# Patient Record
Sex: Female | Born: 1945 | Race: White | Hispanic: No | Marital: Married | Smoking: Former smoker
Health system: Southern US, Community
[De-identification: ages and names within clinical notes are randomized; demographics above are authoritative.]

## PROBLEM LIST (undated history)

## (undated) DIAGNOSIS — K759 Inflammatory liver disease, unspecified: Secondary | ICD-10-CM

## (undated) DIAGNOSIS — E7800 Pure hypercholesterolemia, unspecified: Secondary | ICD-10-CM

## (undated) DIAGNOSIS — N183 Chronic kidney disease, stage 3 unspecified: Secondary | ICD-10-CM

## (undated) DIAGNOSIS — Z972 Presence of dental prosthetic device (complete) (partial): Secondary | ICD-10-CM

## (undated) DIAGNOSIS — I2510 Atherosclerotic heart disease of native coronary artery without angina pectoris: Secondary | ICD-10-CM

## (undated) DIAGNOSIS — I351 Nonrheumatic aortic (valve) insufficiency: Secondary | ICD-10-CM

## (undated) DIAGNOSIS — E559 Vitamin D deficiency, unspecified: Secondary | ICD-10-CM

## (undated) DIAGNOSIS — M19072 Primary osteoarthritis, left ankle and foot: Secondary | ICD-10-CM

## (undated) DIAGNOSIS — N189 Chronic kidney disease, unspecified: Secondary | ICD-10-CM

## (undated) DIAGNOSIS — E119 Type 2 diabetes mellitus without complications: Secondary | ICD-10-CM

## (undated) DIAGNOSIS — R0609 Other forms of dyspnea: Secondary | ICD-10-CM

## (undated) DIAGNOSIS — H409 Unspecified glaucoma: Secondary | ICD-10-CM

## (undated) DIAGNOSIS — Z87442 Personal history of urinary calculi: Secondary | ICD-10-CM

## (undated) DIAGNOSIS — I6523 Occlusion and stenosis of bilateral carotid arteries: Secondary | ICD-10-CM

## (undated) DIAGNOSIS — G629 Polyneuropathy, unspecified: Secondary | ICD-10-CM

## (undated) DIAGNOSIS — R0600 Dyspnea, unspecified: Secondary | ICD-10-CM

## (undated) DIAGNOSIS — I10 Essential (primary) hypertension: Secondary | ICD-10-CM

## (undated) HISTORY — PX: CARDIAC CATHETERIZATION: SHX172

## (undated) HISTORY — PX: CORONARY ANGIOPLASTY: SHX604

## (undated) HISTORY — PX: COLONOSCOPY: SHX174

## (undated) HISTORY — PX: ABDOMINAL HYSTERECTOMY: SHX81

## (undated) HISTORY — PX: TONSILLECTOMY: SUR1361

## (undated) HISTORY — PX: BREAST CYST ASPIRATION: SHX578

## (undated) HISTORY — PX: EYE SURGERY: SHX253

## (undated) HISTORY — PX: OTHER SURGICAL HISTORY: SHX169

---

## 1973-09-12 HISTORY — PX: ABDOMINAL HYSTERECTOMY: SHX81

## 1991-09-13 DIAGNOSIS — K759 Inflammatory liver disease, unspecified: Secondary | ICD-10-CM

## 1991-09-13 DIAGNOSIS — B1910 Unspecified viral hepatitis B without hepatic coma: Secondary | ICD-10-CM

## 1991-09-13 HISTORY — DX: Inflammatory liver disease, unspecified: K75.9

## 1991-09-13 HISTORY — DX: Unspecified viral hepatitis B without hepatic coma: B19.10

## 1997-09-12 HISTORY — PX: BREAST CYST ASPIRATION: SHX578

## 2004-08-12 ENCOUNTER — Ambulatory Visit: Payer: Self-pay | Admitting: Internal Medicine

## 2005-10-27 ENCOUNTER — Ambulatory Visit: Payer: Self-pay | Admitting: Internal Medicine

## 2006-10-30 ENCOUNTER — Ambulatory Visit: Payer: Self-pay | Admitting: Internal Medicine

## 2006-11-02 ENCOUNTER — Ambulatory Visit: Payer: Self-pay | Admitting: Internal Medicine

## 2007-05-04 ENCOUNTER — Ambulatory Visit: Payer: Self-pay | Admitting: Internal Medicine

## 2007-11-15 ENCOUNTER — Ambulatory Visit: Payer: Self-pay | Admitting: Internal Medicine

## 2008-11-27 ENCOUNTER — Ambulatory Visit: Payer: Self-pay | Admitting: Internal Medicine

## 2009-12-28 ENCOUNTER — Ambulatory Visit: Payer: Self-pay | Admitting: Internal Medicine

## 2009-12-30 ENCOUNTER — Ambulatory Visit: Payer: Self-pay | Admitting: Internal Medicine

## 2010-12-30 ENCOUNTER — Ambulatory Visit: Payer: Self-pay | Admitting: Internal Medicine

## 2012-01-03 ENCOUNTER — Ambulatory Visit: Payer: Self-pay | Admitting: Internal Medicine

## 2013-01-03 ENCOUNTER — Ambulatory Visit: Payer: Self-pay | Admitting: Internal Medicine

## 2013-01-15 ENCOUNTER — Ambulatory Visit: Payer: Self-pay | Admitting: Internal Medicine

## 2014-01-16 ENCOUNTER — Ambulatory Visit: Payer: Self-pay | Admitting: Internal Medicine

## 2014-07-04 ENCOUNTER — Ambulatory Visit: Payer: Self-pay | Admitting: Gastroenterology

## 2014-07-04 DIAGNOSIS — D126 Benign neoplasm of colon, unspecified: Secondary | ICD-10-CM

## 2014-07-04 HISTORY — DX: Benign neoplasm of colon, unspecified: D12.6

## 2014-12-26 ENCOUNTER — Other Ambulatory Visit: Payer: Self-pay | Admitting: Internal Medicine

## 2014-12-26 DIAGNOSIS — Z1231 Encounter for screening mammogram for malignant neoplasm of breast: Secondary | ICD-10-CM

## 2015-01-05 LAB — SURGICAL PATHOLOGY

## 2015-01-20 ENCOUNTER — Ambulatory Visit
Admission: RE | Admit: 2015-01-20 | Discharge: 2015-01-20 | Disposition: A | Discharge status: Home / Self Care | Payer: BLUE CROSS/BLUE SHIELD | Source: Ambulatory Visit | Attending: Internal Medicine | Admitting: Internal Medicine

## 2015-01-20 DIAGNOSIS — Z1231 Encounter for screening mammogram for malignant neoplasm of breast: Secondary | ICD-10-CM | POA: Insufficient documentation

## 2015-12-24 ENCOUNTER — Other Ambulatory Visit: Payer: Self-pay | Admitting: Internal Medicine

## 2015-12-24 DIAGNOSIS — Z1231 Encounter for screening mammogram for malignant neoplasm of breast: Secondary | ICD-10-CM

## 2016-01-21 ENCOUNTER — Ambulatory Visit
Admission: RE | Admit: 2016-01-21 | Discharge: 2016-01-21 | Disposition: A | Discharge status: Home / Self Care | Payer: BLUE CROSS/BLUE SHIELD | Source: Ambulatory Visit | Attending: Internal Medicine | Admitting: Internal Medicine

## 2016-01-21 DIAGNOSIS — Z1231 Encounter for screening mammogram for malignant neoplasm of breast: Secondary | ICD-10-CM | POA: Diagnosis present

## 2016-09-23 ENCOUNTER — Encounter: Payer: Self-pay | Admitting: *Deleted

## 2016-09-27 NOTE — Discharge Instructions (Signed)
Cataract Surgery, Care After °Refer to this sheet in the next few weeks. These instructions provide you with information about caring for yourself after your procedure. Your health care provider may also give you more specific instructions. Your treatment has been planned according to current medical practices, but problems sometimes occur. Call your health care provider if you have any problems or questions after your procedure. °What can I expect after the procedure? °After the procedure, it is common to have: °· Itching. °· Discomfort. °· Fluid discharge. °· Sensitivity to light and to touch. °· Bruising. °Follow these instructions at home: °Eye Care  °· Check your eye every day for signs of infection. Watch for: °¨ Redness, swelling, or pain. °¨ Fluid, blood, or pus. °¨ Warmth. °¨ Bad smell. °Activity  °· Avoid strenuous activities, such as playing contact sports, for as long as told by your health care provider. °· Do not drive or operate heavy machinery until your health care provider approves. °· Do not bend or lift heavy objects . Bending increases pressure in the eye. You can walk, climb stairs, and do light household chores. °· Ask your health care provider when you can return to work. If you work in a dusty environment, you may be advised to wear protective eyewear for a period of time. °General instructions  °· Take or apply over-the-counter and prescription medicines only as told by your health care provider. This includes eye drops. °· Do not touch or rub your eyes. °· If you were given a protective shield, wear it as told by your health care provider. If you were not given a protective shield, wear sunglasses as told by your health care provider to protect your eyes. °· Keep the area around your eye clean and dry. Avoid swimming or allowing water to hit you directly in the face while showering until told by your health care provider. Keep soap and shampoo out of your eyes. °· Do not put a contact lens  into the affected eye or eyes until your health care provider approves. °· Keep all follow-up visits as told by your health care provider. This is important. °Contact a health care provider if: ° °· You have increased bruising around your eye. °· You have pain that is not helped with medicine. °· You have a fever. °· You have redness, swelling, or pain in your eye. °· You have fluid, blood, or pus coming from your incision. °· Your vision gets worse. °Get help right away if: °· You have sudden vision loss. °This information is not intended to replace advice given to you by your health care provider. Make sure you discuss any questions you have with your health care provider. °Document Released: 03/18/2005 Document Revised: 01/07/2016 Document Reviewed: 07/09/2015 °Elsevier Interactive Patient Education © 2017 [employer] ° ° ° ° °General Anesthesia, Adult, Care After °These instructions provide you with information about caring for yourself after your procedure. Your health care provider may also give you more specific instructions. Your treatment has been planned according to current medical practices, but problems sometimes occur. Call your health care provider if you have any problems or questions after your procedure. °What can I expect after the procedure? °After the procedure, it is common to have: °· Vomiting. °· A sore throat. °· Mental slowness. °It is common to feel: °· Nauseous. °· Cold or shivery. °· Sleepy. °· Tired. °· Sore or achy, even in parts of your body where you did not have surgery. °Follow these instructions at   home: °For at least 24 hours after the procedure:  °· Do not: °¨ Participate in activities where you could fall or become injured. °¨ Drive. °¨ Use heavy machinery. °¨ Drink alcohol. °¨ Take sleeping pills or medicines that cause drowsiness. °¨ Make important decisions or sign legal documents. °¨ Take care of children on your own. °· Rest. °Eating and drinking  °· If you vomit, drink  water, juice, or soup when you can drink without vomiting. °· Drink enough fluid to keep your urine clear or pale yellow. °· Make sure you have little or no nausea before eating solid foods. °· Follow the diet recommended by your health care provider. °General instructions  °· Have a responsible adult stay with you until you are awake and alert. °· Return to your normal activities as told by your health care provider. Ask your health care provider what activities are safe for you. °· Take over-the-counter and prescription medicines only as told by your health care provider. °· If you smoke, do not smoke without supervision. °· Keep all follow-up visits as told by your health care provider. This is important. °Contact a health care provider if: °· You continue to have nausea or vomiting at home, and medicines are not helpful. °· You cannot drink fluids or start eating again. °· You cannot urinate after 8-12 hours. °· You develop a skin rash. °· You have fever. °· You have increasing redness at the site of your procedure. °Get help right away if: °· You have difficulty breathing. °· You have chest pain. °· You have unexpected bleeding. °· You feel that you are having a life-threatening or urgent problem. °This information is not intended to replace advice given to you by your health care provider. Make sure you discuss any questions you have with your health care provider. °Document Released: 12/05/2000 Document Revised: 02/01/2016 Document Reviewed: 08/13/2015 °Elsevier Interactive Patient Education © 2017 [employer] ° °

## 2016-10-19 ENCOUNTER — Encounter
Admission: RE | Disposition: A | Discharge status: Home / Self Care | Payer: Self-pay | Source: Ambulatory Visit | Attending: Ophthalmology

## 2016-10-19 ENCOUNTER — Encounter: Payer: Self-pay | Admitting: Anesthesiology

## 2016-10-19 ENCOUNTER — Ambulatory Visit
Admission: RE | Admit: 2016-10-19 | Discharge: 2016-10-19 | Disposition: A | Discharge status: Home / Self Care | Payer: BLUE CROSS/BLUE SHIELD | Source: Ambulatory Visit | Attending: Ophthalmology | Admitting: Ophthalmology

## 2016-10-19 DIAGNOSIS — H2511 Age-related nuclear cataract, right eye: Secondary | ICD-10-CM | POA: Insufficient documentation

## 2016-10-19 HISTORY — DX: Presence of dental prosthetic device (complete) (partial): Z97.2

## 2016-10-19 HISTORY — DX: Inflammatory liver disease, unspecified: K75.9

## 2016-10-19 HISTORY — DX: Pure hypercholesterolemia, unspecified: E78.00

## 2016-10-19 HISTORY — DX: Essential (primary) hypertension: I10

## 2016-10-19 HISTORY — PX: CATARACT EXTRACTION W/PHACO: SHX586

## 2016-10-19 HISTORY — DX: Polyneuropathy, unspecified: G62.9

## 2016-10-19 SURGERY — PHACOEMULSIFICATION, CATARACT, WITH IOL INSERTION
Anesthesia: Monitor Anesthesia Care | Laterality: Right | Wound class: Clean

## 2016-10-19 MED ORDER — ACETAMINOPHEN 160 MG/5ML PO SOLN: 325.0000 mg | ORAL | Status: DC | PRN

## 2016-10-19 MED ORDER — EPINEPHRINE PF 1 MG/ML IJ SOLN
INTRAMUSCULAR | Status: DC | PRN
  Administered 2016-10-19: 59 mL via OPHTHALMIC

## 2016-10-19 MED ORDER — ACETAMINOPHEN 325 MG PO TABS: 325.0000 mg | ORAL_TABLET | ORAL | Status: DC | PRN

## 2016-10-19 MED ORDER — LIDOCAINE HCL (PF) 2 % IJ SOLN
INTRAMUSCULAR | Status: DC | PRN
  Administered 2016-10-19: 1 mL

## 2016-10-19 MED ORDER — ARMC OPHTHALMIC DILATING DROPS
1.0000 "application " | OPHTHALMIC | Status: DC | PRN
  Administered 2016-10-19 (×2): 1 via OPHTHALMIC

## 2016-10-19 MED ORDER — FENTANYL CITRATE (PF) 100 MCG/2ML IJ SOLN
INTRAMUSCULAR | Status: DC | PRN
  Administered 2016-10-19: 100 ug via INTRAVENOUS

## 2016-10-19 MED ORDER — NA HYALUR & NA CHOND-NA HYALUR 0.4-0.35 ML IO KIT
PACK | INTRAOCULAR | Status: DC | PRN
  Administered 2016-10-19: 1 mL via INTRAOCULAR

## 2016-10-19 MED ORDER — LACTATED RINGERS IV SOLN: INTRAVENOUS | Status: DC

## 2016-10-19 MED ORDER — MOXIFLOXACIN HCL 0.5 % OP SOLN
1.0000 [drp] | OPHTHALMIC | Status: DC | PRN
  Administered 2016-10-19 (×3): 1 [drp] via OPHTHALMIC

## 2016-10-19 MED ORDER — MIDAZOLAM HCL 2 MG/2ML IJ SOLN
INTRAMUSCULAR | Status: DC | PRN
  Administered 2016-10-19: 2 mg via INTRAVENOUS

## 2016-10-19 MED ORDER — BRIMONIDINE TARTRATE-TIMOLOL 0.2-0.5 % OP SOLN
OPHTHALMIC | Status: DC | PRN
  Administered 2016-10-19: 1 [drp] via OPHTHALMIC

## 2016-10-19 MED ORDER — CEFUROXIME OPHTHALMIC INJECTION 1 MG/0.1 ML
INJECTION | OPHTHALMIC | Status: DC | PRN
  Administered 2016-10-19: 0.1 mL via OPHTHALMIC

## 2016-10-19 SURGICAL SUPPLY — 25 items
CANNULA ANT/CHMB 27GA (MISCELLANEOUS) ×3 IMPLANT
CARTRIDGE ABBOTT (MISCELLANEOUS) IMPLANT
GLOVE SURG LX 7.5 STRW (GLOVE) ×2
GLOVE SURG LX STRL 7.5 STRW (GLOVE) ×1 IMPLANT
GLOVE SURG TRIUMPH 8.0 PF LTX (GLOVE) ×3 IMPLANT
GOWN STRL REUS W/ TWL LRG LVL3 (GOWN DISPOSABLE) ×2 IMPLANT
GOWN STRL REUS W/TWL LRG LVL3 (GOWN DISPOSABLE) ×4
LENS IOL TECNIS ITEC 18.5 (Intraocular Lens) ×3 IMPLANT
MARKER SKIN DUAL TIP RULER LAB (MISCELLANEOUS) ×3 IMPLANT
NDL RETROBULBAR .5 NSTRL (NEEDLE) IMPLANT
NEEDLE FILTER BLUNT 18X 1/2SAF (NEEDLE) ×2
NEEDLE FILTER BLUNT 18X1 1/2 (NEEDLE) ×1 IMPLANT
PACK CATARACT BRASINGTON (MISCELLANEOUS) ×3 IMPLANT
PACK EYE AFTER SURG (MISCELLANEOUS) ×3 IMPLANT
PACK OPTHALMIC (MISCELLANEOUS) ×3 IMPLANT
RING MALYGIN 7.0 (MISCELLANEOUS) IMPLANT
SUT ETHILON 10-0 CS-B-6CS-B-6 (SUTURE)
SUT VICRYL  9 0 (SUTURE)
SUT VICRYL 9 0 (SUTURE) IMPLANT
SUTURE EHLN 10-0 CS-B-6CS-B-6 (SUTURE) IMPLANT
SYR 3ML LL SCALE MARK (SYRINGE) ×3 IMPLANT
SYR 5ML LL (SYRINGE) ×3 IMPLANT
SYR TB 1ML LUER SLIP (SYRINGE) ×3 IMPLANT
WATER STERILE IRR 250ML POUR (IV SOLUTION) ×3 IMPLANT
WIPE NON LINTING 3.25X3.25 (MISCELLANEOUS) ×3 IMPLANT

## 2016-10-19 NOTE — Op Note (Signed)
LOCATION:  [place]   PREOPERATIVE DIAGNOSIS:    Nuclear sclerotic cataract right eye. H25.11   POSTOPERATIVE DIAGNOSIS:  Nuclear sclerotic cataract right eye.     PROCEDURE:  Phacoemusification with posterior chamber intraocular lens placement of the right eye   LENS:   Implant Name Type Inv. Item Serial No. Manufacturer Lot No. LRB No. Used  LENS IOL DIOP 18.5 - [id number] Intraocular Lens LENS IOL DIOP 18.5 [id number]   Right 1        ULTRASOUND TIME: 17 % of 1 minutes, 3 seconds.  CDE 10.6   SURGEON:  Wyonia Hough, MD   ANESTHESIA:  Topical with tetracaine drops and 2% Xylocaine jelly, augmented with 1% preservative-free intracameral lidocaine.    COMPLICATIONS:  None.   DESCRIPTION OF PROCEDURE:  The patient was identified in the holding room and transported to the operating room and placed in the supine position under the operating microscope.  The right eye was identified as the operative eye and it was prepped and draped in the usual sterile ophthalmic fashion.   A 1 millimeter clear-corneal paracentesis was made at the 12:00 position.  0.5 ml of preservative-free 1% lidocaine was injected into the anterior chamber. The anterior chamber was filled with Viscoat viscoelastic.  A 2.4 millimeter keratome was used to make a near-clear corneal incision at the 9:00 position.  A curvilinear capsulorrhexis was made with a cystotome and capsulorrhexis forceps.  Balanced salt solution was used to hydrodissect and hydrodelineate the nucleus.   Phacoemulsification was then used in stop and chop fashion to remove the lens nucleus and epinucleus.  The remaining cortex was then removed using the irrigation and aspiration handpiece. Provisc was then placed into the capsular bag to distend it for lens placement.  A lens was then injected into the capsular bag.  The remaining viscoelastic was aspirated.   Wounds were hydrated with balanced salt solution.  The anterior  chamber was inflated to a physiologic pressure with balanced salt solution.  No wound leaks were noted. Cefuroxime 0.1 ml of a 10mg /ml solution was injected into the anterior chamber for a dose of 1 mg of intracameral antibiotic at the completion of the case.   Timolol and Brimonidine drops were applied to the eye.  The patient was taken to the recovery room in stable condition without complications of anesthesia or surgery.   Zonnie Landen 10/19/2016, 11:35 AM

## 2016-10-19 NOTE — Anesthesia Procedure Notes (Signed)
Procedure Name: MAC Performed by: Mayme Genta Pre-anesthesia Checklist: Patient identified, Emergency Drugs available, Suction available, Timeout performed and Patient being monitored Patient Re-evaluated:Patient Re-evaluated prior to inductionOxygen Delivery Method: Nasal cannula Placement Confirmation: positive ETCO2

## 2016-10-19 NOTE — Transfer of Care (Signed)
Immediate Anesthesia Transfer of Care Note  Patient: Tamara Villegas  Procedure(s) Performed: Procedure(s): CATARACT EXTRACTION PHACO AND INTRAOCULAR LENS PLACEMENT (I[facility]) Right (Right)  Patient Location: PACU  Anesthesia Type: MAC  Level of Consciousness: awake, alert  and patient cooperative  Airway and Oxygen Therapy: Patient Spontanous Breathing and Patient connected to supplemental oxygen  Post-op Assessment: Post-op Vital signs reviewed, Patient's Cardiovascular Status Stable, Respiratory Function Stable, Patent Airway and No signs of Nausea or vomiting  Post-op Vital Signs: Reviewed and stable  Complications: No apparent anesthesia complications

## 2016-10-19 NOTE — Anesthesia Preprocedure Evaluation (Signed)
Anesthesia Evaluation  Patient identified by MRN, date of birth, ID band Patient awake    Reviewed: Allergy & Precautions, H&P , NPO status , Patient's Chart, lab work & pertinent test results  Airway Mallampati: II  TM Distance: >3 FB Neck ROM: full    Dental no notable dental hx.    Pulmonary former smoker,    Pulmonary exam normal        Cardiovascular hypertension, Normal cardiovascular exam     Neuro/Psych    GI/Hepatic   Endo/Other    Renal/GU      Musculoskeletal   Abdominal   Peds  Hematology   Anesthesia Other Findings   Reproductive/Obstetrics                             Anesthesia Physical Anesthesia Plan  ASA: II  Anesthesia Plan: MAC   Post-op Pain Management:    Induction:   Airway Management Planned:   Additional Equipment:   Intra-op Plan:   Post-operative Plan:   Informed Consent: I have reviewed the patients History and Physical, chart, labs and discussed the procedure including the risks, benefits and alternatives for the proposed anesthesia with the patient or authorized representative who has indicated his/her understanding and acceptance.     Plan Discussed with:   Anesthesia Plan Comments:         Anesthesia Quick Evaluation

## 2016-10-19 NOTE — H&P (Signed)
The History and Physical notes are on paper, have been signed, and are to be scanned. The patient remains stable and unchanged from the H&P.   Previous H&P reviewed, patient examined, and there are no changes.  Tamara Villegas 10/19/2016 10:36 AM

## 2016-10-19 NOTE — Anesthesia Postprocedure Evaluation (Signed)
Anesthesia Post Note  Patient: [place]  Procedure(s) Performed: Procedure(s) (LRB): CATARACT EXTRACTION PHACO AND INTRAOCULAR LENS PLACEMENT ([facility]) Right (Right)  Patient location during evaluation: PACU Anesthesia Type: MAC Level of consciousness: awake and alert and oriented Pain management: satisfactory to patient Vital Signs Assessment: post-procedure vital signs reviewed and stable Respiratory status: spontaneous breathing, nonlabored ventilation and respiratory function stable Cardiovascular status: blood pressure returned to baseline and stable Postop Assessment: Adequate PO intake and No signs of nausea or vomiting Anesthetic complications: no    Raliegh Ip

## 2016-10-20 ENCOUNTER — Encounter: Payer: Self-pay | Admitting: Ophthalmology

## 2016-10-26 ENCOUNTER — Encounter: Payer: Self-pay | Admitting: *Deleted

## 2016-10-26 NOTE — Discharge Instructions (Signed)
General Anesthesia, Adult, Care After °These instructions provide you with information about caring for yourself after your procedure. Your health care provider may also give you more specific instructions. Your treatment has been planned according to current medical practices, but problems sometimes occur. Call your health care provider if you have any problems or questions after your procedure. °What can I expect after the procedure? °After the procedure, it is common to have: °· Vomiting. °· A sore throat. °· Mental slowness. °It is common to feel: °· Nauseous. °· Cold or shivery. °· Sleepy. °· Tired. °· Sore or achy, even in parts of your body where you did not have surgery. °Follow these instructions at home: °For at least 24 hours after the procedure: °· Do not: °¨ Participate in activities where you could fall or become injured. °¨ Drive. °¨ Use heavy machinery. °¨ Drink alcohol. °¨ Take sleeping pills or medicines that cause drowsiness. °¨ Make important decisions or sign legal documents. °¨ Take care of children on your own. °· Rest. °Eating and drinking °· If you vomit, drink water, juice, or soup when you can drink without vomiting. °· Drink enough fluid to keep your urine clear or pale yellow. °· Make sure you have little or no nausea before eating solid foods. °· Follow the diet recommended by your health care provider. °General instructions °· Have a responsible adult stay with you until you are awake and alert. °· Return to your normal activities as told by your health care provider. Ask your health care provider what activities are safe for you. °· Take over-the-counter and prescription medicines only as told by your health care provider. °· If you smoke, do not smoke without supervision. °· Keep all follow-up visits as told by your health care provider. This is important. °Contact a health care provider if: °· You continue to have nausea or vomiting at home, and medicines are not helpful. °· You  cannot drink fluids or start eating again. °· You cannot urinate after 8-12 hours. °· You develop a skin rash. °· You have fever. °· You have increasing redness at the site of your procedure. °Get help right away if: °· You have difficulty breathing. °· You have chest pain. °· You have unexpected bleeding. °· You feel that you are having a life-threatening or urgent problem. °This information is not intended to replace advice given to you by your health care provider. Make sure you discuss any questions you have with your health care provider. °Document Released: 12/05/2000 Document Revised: 02/01/2016 Document Reviewed: 08/13/2015 °Elsevier Interactive Patient Education © 2017 [employer] ° ° °Cataract Surgery, Care After °Refer to this sheet in the next few weeks. These instructions provide you with information about caring for yourself after your procedure. Your health care provider may also give you more specific instructions. Your treatment has been planned according to current medical practices, but problems sometimes occur. Call your health care provider if you have any problems or questions after your procedure. °What can I expect after the procedure? °After the procedure, it is common to have: °· Itching. °· Discomfort. °· Fluid discharge. °· Sensitivity to light and to touch. °· Bruising. °Follow these instructions at home: °Eye Care °· Check your eye every day for signs of infection. Watch for: °¨ Redness, swelling, or pain. °¨ Fluid, blood, or pus. °¨ Warmth. °¨ Bad smell. °Activity °· Avoid strenuous activities, such as playing contact sports, for as long as told by your health care provider. °· Do not   drive or operate heavy machinery until your health care provider approves. °· Do not bend or lift heavy objects . Bending increases pressure in the eye. You can walk, climb stairs, and do light household chores. °· Ask your health care provider when you can return to work. If you work in a dusty  environment, you may be advised to wear protective eyewear for a period of time. °General instructions °· Take or apply over-the-counter and prescription medicines only as told by your health care provider. This includes eye drops. °· Do not touch or rub your eyes. °· If you were given a protective shield, wear it as told by your health care provider. If you were not given a protective shield, wear sunglasses as told by your health care provider to protect your eyes. °· Keep the area around your eye clean and dry. Avoid swimming or allowing water to hit you directly in the face while showering until told by your health care provider. Keep soap and shampoo out of your eyes. °· Do not put a contact lens into the affected eye or eyes until your health care provider approves. °· Keep all follow-up visits as told by your health care provider. This is important. °Contact a health care provider if: ° °· You have increased bruising around your eye. °· You have pain that is not helped with medicine. °· You have a fever. °· You have redness, swelling, or pain in your eye. °· You have fluid, blood, or pus coming from your incision. °· Your vision gets worse. °Get help right away if: °· You have sudden vision loss. °This information is not intended to replace advice given to you by your health care provider. Make sure you discuss any questions you have with your health care provider. °Document Released: 03/18/2005 Document Revised: 01/07/2016 Document Reviewed: 07/09/2015 °Elsevier Interactive Patient Education © 2017 [employer] Inc. ° °

## 2016-11-02 ENCOUNTER — Ambulatory Visit: Payer: BLUE CROSS/BLUE SHIELD | Admitting: Student in an Organized Health Care Education/Training Program

## 2016-11-02 ENCOUNTER — Ambulatory Visit
Admission: RE | Admit: 2016-11-02 | Discharge: 2016-11-02 | Disposition: A | Discharge status: Home / Self Care | Payer: BLUE CROSS/BLUE SHIELD | Source: Ambulatory Visit | Attending: Ophthalmology | Admitting: Ophthalmology

## 2016-11-02 ENCOUNTER — Encounter
Admission: RE | Disposition: A | Discharge status: Home / Self Care | Payer: Self-pay | Source: Ambulatory Visit | Attending: Ophthalmology

## 2016-11-02 DIAGNOSIS — I10 Essential (primary) hypertension: Secondary | ICD-10-CM | POA: Diagnosis not present

## 2016-11-02 DIAGNOSIS — Z87891 Personal history of nicotine dependence: Secondary | ICD-10-CM | POA: Insufficient documentation

## 2016-11-02 DIAGNOSIS — H2512 Age-related nuclear cataract, left eye: Secondary | ICD-10-CM | POA: Insufficient documentation

## 2016-11-02 DIAGNOSIS — Z79899 Other long term (current) drug therapy: Secondary | ICD-10-CM | POA: Diagnosis not present

## 2016-11-02 HISTORY — PX: CATARACT EXTRACTION W/PHACO: SHX586

## 2016-11-02 SURGERY — PHACOEMULSIFICATION, CATARACT, WITH IOL INSERTION
Anesthesia: Monitor Anesthesia Care | Site: Eye | Laterality: Left | Wound class: Clean

## 2016-11-02 MED ORDER — NA HYALUR & NA CHOND-NA HYALUR 0.4-0.35 ML IO KIT
PACK | INTRAOCULAR | Status: DC | PRN
  Administered 2016-11-02: 1 mL via INTRAOCULAR

## 2016-11-02 MED ORDER — EPINEPHRINE PF 1 MG/ML IJ SOLN
INTRAOCULAR | Status: DC | PRN
  Administered 2016-11-02: 62 mL via OPHTHALMIC

## 2016-11-02 MED ORDER — BRIMONIDINE TARTRATE-TIMOLOL 0.2-0.5 % OP SOLN
OPHTHALMIC | Status: DC | PRN
  Administered 2016-11-02: 1 [drp] via OPHTHALMIC

## 2016-11-02 MED ORDER — MOXIFLOXACIN HCL 0.5 % OP SOLN
1.0000 [drp] | OPHTHALMIC | Status: DC | PRN
  Administered 2016-11-02 (×3): 1 [drp] via OPHTHALMIC

## 2016-11-02 MED ORDER — ARMC OPHTHALMIC DILATING DROPS
1.0000 "application " | OPHTHALMIC | Status: DC | PRN
  Administered 2016-11-02 (×3): 1 via OPHTHALMIC

## 2016-11-02 MED ORDER — MIDAZOLAM HCL 2 MG/2ML IJ SOLN
INTRAMUSCULAR | Status: DC | PRN
  Administered 2016-11-02: 2 mg via INTRAVENOUS

## 2016-11-02 MED ORDER — FENTANYL CITRATE (PF) 100 MCG/2ML IJ SOLN
INTRAMUSCULAR | Status: DC | PRN
  Administered 2016-11-02: 100 ug via INTRAVENOUS

## 2016-11-02 MED ORDER — CEFUROXIME OPHTHALMIC INJECTION 1 MG/0.1 ML
INJECTION | OPHTHALMIC | Status: DC | PRN
  Administered 2016-11-02: .3 mL via OPHTHALMIC

## 2016-11-02 MED ORDER — LIDOCAINE HCL (PF) 2 % IJ SOLN
INTRAOCULAR | Status: DC | PRN
  Administered 2016-11-02: 1 mL via INTRAOCULAR

## 2016-11-02 SURGICAL SUPPLY — 25 items
CANNULA ANT/CHMB 27GA (MISCELLANEOUS) ×3 IMPLANT
CARTRIDGE ABBOTT (MISCELLANEOUS) IMPLANT
GLOVE SURG LX 7.5 STRW (GLOVE) ×2
GLOVE SURG LX STRL 7.5 STRW (GLOVE) ×1 IMPLANT
GLOVE SURG TRIUMPH 8.0 PF LTX (GLOVE) ×3 IMPLANT
GOWN STRL REUS W/ TWL LRG LVL3 (GOWN DISPOSABLE) ×2 IMPLANT
GOWN STRL REUS W/TWL LRG LVL3 (GOWN DISPOSABLE) ×4
LENS IOL TECNIS ITEC 19.5 (Intraocular Lens) ×3 IMPLANT
MARKER SKIN DUAL TIP RULER LAB (MISCELLANEOUS) ×3 IMPLANT
NDL RETROBULBAR .5 NSTRL (NEEDLE) IMPLANT
NEEDLE FILTER BLUNT 18X 1/2SAF (NEEDLE) ×2
NEEDLE FILTER BLUNT 18X1 1/2 (NEEDLE) ×1 IMPLANT
PACK CATARACT BRASINGTON (MISCELLANEOUS) ×3 IMPLANT
PACK EYE AFTER SURG (MISCELLANEOUS) ×3 IMPLANT
PACK OPTHALMIC (MISCELLANEOUS) ×3 IMPLANT
RING MALYGIN 7.0 (MISCELLANEOUS) IMPLANT
SUT ETHILON 10-0 CS-B-6CS-B-6 (SUTURE)
SUT VICRYL  9 0 (SUTURE)
SUT VICRYL 9 0 (SUTURE) IMPLANT
SUTURE EHLN 10-0 CS-B-6CS-B-6 (SUTURE) IMPLANT
SYR 3ML LL SCALE MARK (SYRINGE) ×3 IMPLANT
SYR 5ML LL (SYRINGE) ×3 IMPLANT
SYR TB 1ML LUER SLIP (SYRINGE) ×3 IMPLANT
WATER STERILE IRR 250ML POUR (IV SOLUTION) ×3 IMPLANT
WIPE NON LINTING 3.25X3.25 (MISCELLANEOUS) ×3 IMPLANT

## 2016-11-02 NOTE — H&P (Signed)
The History and Physical notes are on paper, have been signed, and are to be scanned. The patient remains stable and unchanged from the H&P.   Previous H&P reviewed, patient examined, and there are no changes.  Tamara Villegas 11/02/2016 10:18 AM

## 2016-11-02 NOTE — Transfer of Care (Signed)
Immediate Anesthesia Transfer of Care Note  Patient: [place]  Procedure(s) Performed: Procedure(s): CATARACT EXTRACTION PHACO AND INTRAOCULAR LENS PLACEMENT (I[facility])  left (Left)  Patient Location: PACU  Anesthesia Type: MAC  Level of Consciousness: awake, alert  and patient cooperative  Airway and Oxygen Therapy: Patient Spontanous Breathing and Patient connected to supplemental oxygen  Post-op Assessment: Post-op Vital signs reviewed, Patient's Cardiovascular Status Stable, Respiratory Function Stable, Patent Airway and No signs of Nausea or vomiting  Post-op Vital Signs: Reviewed and stable  Complications: No apparent anesthesia complications

## 2016-11-02 NOTE — Anesthesia Preprocedure Evaluation (Signed)
Anesthesia Evaluation  Patient identified by MRN, date of birth, ID band Patient awake    Reviewed: Allergy & Precautions, H&P , NPO status , Patient's Chart, lab work & pertinent test results  Airway Mallampati: II  TM Distance: >3 FB Neck ROM: full    Dental no notable dental hx.    Pulmonary former smoker,    Pulmonary exam normal        Cardiovascular hypertension, On Medications Normal cardiovascular exam     Neuro/Psych    GI/Hepatic negative GI ROS,   Endo/Other  negative endocrine ROS  Renal/GU negative Renal ROS     Musculoskeletal   Abdominal   Peds  Hematology negative hematology ROS (+)   Anesthesia Other Findings   Reproductive/Obstetrics negative OB ROS                             Anesthesia Physical Anesthesia Plan  ASA: II  Anesthesia Plan: MAC   Post-op Pain Management:    Induction:   Airway Management Planned:   Additional Equipment:   Intra-op Plan:   Post-operative Plan:   Informed Consent: I have reviewed the patients History and Physical, chart, labs and discussed the procedure including the risks, benefits and alternatives for the proposed anesthesia with the patient or authorized representative who has indicated his/her understanding and acceptance.     Plan Discussed with:   Anesthesia Plan Comments:         Anesthesia Quick Evaluation

## 2016-11-02 NOTE — Op Note (Signed)
OPERATIVE NOTE  ALMADELIA UMANA [id number] 11/02/2016   PREOPERATIVE DIAGNOSIS:  Nuclear sclerotic cataract left eye. H25.12   POSTOPERATIVE DIAGNOSIS:    Nuclear sclerotic cataract left eye.     PROCEDURE:  Phacoemusification with posterior chamber intraocular lens placement of the left eye   LENS:   Implant Name Type Inv. Item Serial No. Manufacturer Lot No. LRB No. Used  LENS IOL DIOP 19.5 - [id number] Intraocular Lens LENS IOL DIOP 19.5 [id number]   Left 1        ULTRASOUND TIME: 12  % of 0 minutes 56 seconds, CDE 6.6  SURGEON:  Wyonia Hough, MD   ANESTHESIA:  Topical with tetracaine drops and 2% Xylocaine jelly, augmented with 1% preservative-free intracameral lidocaine.    COMPLICATIONS:  None.   DESCRIPTION OF PROCEDURE:  The patient was identified in the holding room and transported to the operating room and placed in the supine position under the operating microscope.  The left eye was identified as the operative eye and it was prepped and draped in the usual sterile ophthalmic fashion.   A 1 millimeter clear-corneal paracentesis was made at the 1:30 position.  0.5 ml of preservative-free 1% lidocaine was injected into the anterior chamber.  The anterior chamber was filled with Viscoat viscoelastic.  A 2.4 millimeter keratome was used to make a near-clear corneal incision at the 10:30 position.  .  A curvilinear capsulorrhexis was made with a cystotome and capsulorrhexis forceps.  Balanced salt solution was used to hydrodissect and hydrodelineate the nucleus.   Phacoemulsification was then used in stop and chop fashion to remove the lens nucleus and epinucleus.  The remaining cortex was then removed using the irrigation and aspiration handpiece. Provisc was then placed into the capsular bag to distend it for lens placement.  A lens was then injected into the capsular bag.  The remaining viscoelastic was aspirated.   Wounds were hydrated with balanced salt  solution.  The anterior chamber was inflated to a physiologic pressure with balanced salt solution.  No wound leaks were noted. Cefuroxime 0.1 ml of a 10mg /ml solution was injected into the anterior chamber for a dose of 1 mg of intracameral antibiotic at the completion of the case.   Timolol and Brimonidine drops were applied to the eye.  The patient was taken to the recovery room in stable condition without complications of anesthesia or surgery.  Bernis Schreur 11/02/2016, 11:26 AM

## 2016-11-02 NOTE — Anesthesia Procedure Notes (Signed)
Procedure Name: MAC Date/Time: 11/02/2016 11:06 AM Performed by: Janna Arch Pre-anesthesia Checklist: Patient identified, Emergency Drugs available, Suction available and Patient being monitored Patient Re-evaluated:Patient Re-evaluated prior to inductionOxygen Delivery Method: Nasal cannula

## 2016-11-02 NOTE — Anesthesia Postprocedure Evaluation (Signed)
Anesthesia Post Note  Patient: Tamara Villegas  Procedure(s) Performed: Procedure(s) (LRB): CATARACT EXTRACTION PHACO AND INTRAOCULAR LENS PLACEMENT ([place])  left (Left)  Patient location during evaluation: PACU Anesthesia Type: MAC Level of consciousness: awake Pain management: pain level controlled Vital Signs Assessment: post-procedure vital signs reviewed and stable Respiratory status: spontaneous breathing Cardiovascular status: blood pressure returned to baseline Postop Assessment: no headache Anesthetic complications: no    Jaci Standard, III,  Dai Mcadams D

## 2016-11-03 ENCOUNTER — Encounter: Payer: Self-pay | Admitting: Ophthalmology

## 2016-12-14 DIAGNOSIS — B029 Zoster without complications: Secondary | ICD-10-CM

## 2016-12-14 HISTORY — DX: Zoster without complications: B02.9

## 2016-12-27 ENCOUNTER — Other Ambulatory Visit: Payer: Self-pay | Admitting: Internal Medicine

## 2016-12-27 DIAGNOSIS — Z803 Family history of malignant neoplasm of breast: Secondary | ICD-10-CM

## 2016-12-27 DIAGNOSIS — Z1231 Encounter for screening mammogram for malignant neoplasm of breast: Secondary | ICD-10-CM

## 2017-01-24 ENCOUNTER — Ambulatory Visit
Admission: RE | Admit: 2017-01-24 | Discharge: 2017-01-24 | Disposition: A | Discharge status: Home / Self Care | Payer: BLUE CROSS/BLUE SHIELD | Source: Ambulatory Visit | Attending: Internal Medicine | Admitting: Internal Medicine

## 2017-01-24 ENCOUNTER — Other Ambulatory Visit: Payer: Self-pay | Admitting: Internal Medicine

## 2017-01-24 DIAGNOSIS — Z1231 Encounter for screening mammogram for malignant neoplasm of breast: Secondary | ICD-10-CM | POA: Insufficient documentation

## 2017-01-24 DIAGNOSIS — Z803 Family history of malignant neoplasm of breast: Secondary | ICD-10-CM

## 2017-12-27 ENCOUNTER — Other Ambulatory Visit: Payer: Self-pay | Admitting: Internal Medicine

## 2017-12-27 DIAGNOSIS — Z1231 Encounter for screening mammogram for malignant neoplasm of breast: Secondary | ICD-10-CM

## 2017-12-27 DIAGNOSIS — Z803 Family history of malignant neoplasm of breast: Secondary | ICD-10-CM

## 2018-01-29 ENCOUNTER — Ambulatory Visit
Admission: RE | Admit: 2018-01-29 | Discharge: 2018-01-29 | Disposition: A | Discharge status: Home / Self Care | Payer: BLUE CROSS/BLUE SHIELD | Source: Ambulatory Visit | Attending: Internal Medicine | Admitting: Internal Medicine

## 2018-01-29 DIAGNOSIS — Z803 Family history of malignant neoplasm of breast: Secondary | ICD-10-CM | POA: Insufficient documentation

## 2018-01-29 DIAGNOSIS — Z1231 Encounter for screening mammogram for malignant neoplasm of breast: Secondary | ICD-10-CM

## 2019-01-11 ENCOUNTER — Other Ambulatory Visit: Payer: Self-pay | Admitting: Internal Medicine

## 2019-01-11 DIAGNOSIS — Z1231 Encounter for screening mammogram for malignant neoplasm of breast: Secondary | ICD-10-CM

## 2019-02-19 ENCOUNTER — Encounter (INDEPENDENT_AMBULATORY_CARE_PROVIDER_SITE_OTHER): Payer: Self-pay

## 2019-02-19 ENCOUNTER — Ambulatory Visit
Admission: RE | Admit: 2019-02-19 | Discharge: 2019-02-19 | Disposition: A | Discharge status: Home / Self Care | Payer: BC Managed Care – PPO | Source: Ambulatory Visit | Attending: Internal Medicine | Admitting: Internal Medicine

## 2019-02-19 ENCOUNTER — Other Ambulatory Visit: Payer: Self-pay

## 2019-02-19 DIAGNOSIS — Z1231 Encounter for screening mammogram for malignant neoplasm of breast: Secondary | ICD-10-CM | POA: Insufficient documentation

## 2019-06-07 ENCOUNTER — Other Ambulatory Visit
Admission: RE | Admit: 2019-06-07 | Discharge: 2019-06-07 | Disposition: A | Discharge status: Home / Self Care | Payer: BC Managed Care – PPO | Source: Ambulatory Visit | Attending: Cardiology | Admitting: Cardiology

## 2019-06-07 DIAGNOSIS — Z01812 Encounter for preprocedural laboratory examination: Secondary | ICD-10-CM | POA: Diagnosis present

## 2019-06-07 DIAGNOSIS — Z20828 Contact with and (suspected) exposure to other viral communicable diseases: Secondary | ICD-10-CM | POA: Diagnosis not present

## 2019-06-08 LAB — SARS CORONAVIRUS 2 (TAT 6-24 HRS): SARS Coronavirus 2: NEGATIVE

## 2019-06-12 ENCOUNTER — Observation Stay
Admission: RE | Admit: 2019-06-12 | Discharge: 2019-06-13 | Disposition: A | Discharge status: Home / Self Care | Payer: BC Managed Care – PPO | Attending: Cardiology | Admitting: Cardiology

## 2019-06-12 ENCOUNTER — Encounter
Admission: RE | Disposition: A | Discharge status: Home / Self Care | Payer: Self-pay | Source: Home / Self Care | Attending: Cardiology

## 2019-06-12 ENCOUNTER — Other Ambulatory Visit: Payer: Self-pay

## 2019-06-12 DIAGNOSIS — Z7982 Long term (current) use of aspirin: Secondary | ICD-10-CM | POA: Insufficient documentation

## 2019-06-12 DIAGNOSIS — E1140 Type 2 diabetes mellitus with diabetic neuropathy, unspecified: Secondary | ICD-10-CM | POA: Diagnosis not present

## 2019-06-12 DIAGNOSIS — Z955 Presence of coronary angioplasty implant and graft: Secondary | ICD-10-CM

## 2019-06-12 DIAGNOSIS — Z79899 Other long term (current) drug therapy: Secondary | ICD-10-CM | POA: Insufficient documentation

## 2019-06-12 DIAGNOSIS — R0602 Shortness of breath: Secondary | ICD-10-CM | POA: Diagnosis not present

## 2019-06-12 DIAGNOSIS — E785 Hyperlipidemia, unspecified: Secondary | ICD-10-CM | POA: Diagnosis not present

## 2019-06-12 DIAGNOSIS — I10 Essential (primary) hypertension: Secondary | ICD-10-CM | POA: Insufficient documentation

## 2019-06-12 DIAGNOSIS — I351 Nonrheumatic aortic (valve) insufficiency: Secondary | ICD-10-CM | POA: Diagnosis not present

## 2019-06-12 DIAGNOSIS — R9439 Abnormal result of other cardiovascular function study: Secondary | ICD-10-CM | POA: Diagnosis present

## 2019-06-12 DIAGNOSIS — I2510 Atherosclerotic heart disease of native coronary artery without angina pectoris: Principal | ICD-10-CM | POA: Insufficient documentation

## 2019-06-12 HISTORY — PX: LEFT HEART CATH AND CORONARY ANGIOGRAPHY: CATH118249

## 2019-06-12 HISTORY — PX: CORONARY STENT INTERVENTION: CATH118234

## 2019-06-12 HISTORY — DX: Presence of coronary angioplasty implant and graft: Z95.5

## 2019-06-12 LAB — POCT ACTIVATED CLOTTING TIME
Activated Clotting Time: 257 seconds
Activated Clotting Time: 263 seconds

## 2019-06-12 SURGERY — LEFT HEART CATH AND CORONARY ANGIOGRAPHY
Anesthesia: Moderate Sedation

## 2019-06-12 MED ORDER — ASPIRIN 81 MG PO CHEW: 81.0000 mg | CHEWABLE_TABLET | ORAL | Status: DC

## 2019-06-12 MED ORDER — CALCIUM CARBONATE-VITAMIN D 500-200 MG-UNIT PO TABS
1.0000 | ORAL_TABLET | Freq: Two times a day (BID) | ORAL | Status: DC
  Administered 2019-06-13: 1 via ORAL
  Filled 2019-06-12: qty 1

## 2019-06-12 MED ORDER — TIMOLOL MALEATE 0.5 % OP SOLN
1.0000 [drp] | Freq: Every day | OPHTHALMIC | Status: DC
  Administered 2019-06-13: 1 [drp] via OPHTHALMIC
  Filled 2019-06-12: qty 5

## 2019-06-12 MED ORDER — SODIUM CHLORIDE 0.9 % WEIGHT BASED INFUSION
INTRAVENOUS | Status: AC
  Administered 2019-06-12: 1000 mL via INTRAVENOUS

## 2019-06-12 MED ORDER — FENTANYL CITRATE (PF) 100 MCG/2ML IJ SOLN
INTRAMUSCULAR | Status: AC
  Filled 2019-06-12: qty 2

## 2019-06-12 MED ORDER — ACETAMINOPHEN 325 MG PO TABS: 650.0000 mg | ORAL_TABLET | ORAL | Status: DC | PRN

## 2019-06-12 MED ORDER — TICAGRELOR 90 MG PO TABS
90.0000 mg | ORAL_TABLET | Freq: Two times a day (BID) | ORAL | Status: DC
  Administered 2019-06-12 – 2019-06-13 (×2): 90 mg via ORAL
  Filled 2019-06-12 (×2): qty 1

## 2019-06-12 MED ORDER — ASPIRIN 81 MG PO CHEW
CHEWABLE_TABLET | ORAL | Status: DC | PRN
  Administered 2019-06-12: 243 mg via ORAL

## 2019-06-12 MED ORDER — NITROGLYCERIN 1 MG/10 ML FOR IR/CATH LAB
INTRA_ARTERIAL | Status: AC
  Filled 2019-06-12: qty 10

## 2019-06-12 MED ORDER — NITROGLYCERIN 1 MG/10 ML FOR IR/CATH LAB
INTRA_ARTERIAL | Status: DC | PRN
  Administered 2019-06-12: 200 ug via INTRACORONARY
  Administered 2019-06-12: 300 ug via INTRACORONARY
  Administered 2019-06-12: 200 ug via INTRACORONARY

## 2019-06-12 MED ORDER — ASPIRIN 81 MG PO CHEW
CHEWABLE_TABLET | ORAL | Status: AC
  Filled 2019-06-12: qty 3

## 2019-06-12 MED ORDER — VERAPAMIL HCL 2.5 MG/ML IV SOLN
INTRAVENOUS | Status: DC | PRN
  Administered 2019-06-12 (×2): 2.5 mg via INTRA_ARTERIAL

## 2019-06-12 MED ORDER — ASPIRIN 81 MG PO CHEW
81.0000 mg | CHEWABLE_TABLET | Freq: Every day | ORAL | Status: DC
  Administered 2019-06-13: 81 mg via ORAL
  Filled 2019-06-12: qty 1

## 2019-06-12 MED ORDER — HEPARIN SODIUM (PORCINE) 1000 UNIT/ML IJ SOLN
INTRAMUSCULAR | Status: AC
  Filled 2019-06-12: qty 1

## 2019-06-12 MED ORDER — SODIUM CHLORIDE 0.9 % WEIGHT BASED INFUSION: 1.0000 mL/kg/h | INTRAVENOUS | Status: AC

## 2019-06-12 MED ORDER — LABETALOL HCL 5 MG/ML IV SOLN: 10.0000 mg | INTRAVENOUS | Status: AC | PRN

## 2019-06-12 MED ORDER — IOHEXOL 300 MG/ML  SOLN
INTRAMUSCULAR | Status: DC | PRN
  Administered 2019-06-12: 09:00:00 320 mL

## 2019-06-12 MED ORDER — SODIUM CHLORIDE 0.9 % IV SOLN: 250.0000 mL | INTRAVENOUS | Status: DC | PRN

## 2019-06-12 MED ORDER — TICAGRELOR 90 MG PO TABS
ORAL_TABLET | ORAL | Status: AC
  Filled 2019-06-12: qty 2

## 2019-06-12 MED ORDER — TICAGRELOR 90 MG PO TABS
ORAL_TABLET | ORAL | Status: DC | PRN
  Administered 2019-06-12: 180 mg via ORAL

## 2019-06-12 MED ORDER — SODIUM CHLORIDE 0.9% FLUSH: 3.0000 mL | INTRAVENOUS | Status: DC | PRN

## 2019-06-12 MED ORDER — HEPARIN (PORCINE) IN NACL 2000-0.9 UNIT/L-% IV SOLN
INTRAVENOUS | Status: DC | PRN
  Administered 2019-06-12: 1000 mL

## 2019-06-12 MED ORDER — LISINOPRIL 10 MG PO TABS
20.0000 mg | ORAL_TABLET | Freq: Every day | ORAL | Status: DC
  Administered 2019-06-13: 20 mg via ORAL
  Filled 2019-06-12: qty 2

## 2019-06-12 MED ORDER — SODIUM CHLORIDE 0.9 % WEIGHT BASED INFUSION: INTRAVENOUS | Status: DC

## 2019-06-12 MED ORDER — LATANOPROST 0.005 % OP SOLN
1.0000 [drp] | Freq: Every day | OPHTHALMIC | Status: DC
  Filled 2019-06-12: qty 2.5

## 2019-06-12 MED ORDER — VERAPAMIL HCL 2.5 MG/ML IV SOLN
INTRAVENOUS | Status: AC
  Filled 2019-06-12: qty 2

## 2019-06-12 MED ORDER — MIDAZOLAM HCL 2 MG/2ML IJ SOLN
INTRAMUSCULAR | Status: AC
  Filled 2019-06-12: qty 2

## 2019-06-12 MED ORDER — SODIUM CHLORIDE 0.9% FLUSH
3.0000 mL | Freq: Two times a day (BID) | INTRAVENOUS | Status: DC
  Administered 2019-06-12: 3 mL via INTRAVENOUS

## 2019-06-12 MED ORDER — HYDRALAZINE HCL 20 MG/ML IJ SOLN: 10.0000 mg | INTRAMUSCULAR | Status: AC | PRN

## 2019-06-12 MED ORDER — MIDAZOLAM HCL 2 MG/2ML IJ SOLN
INTRAMUSCULAR | Status: DC | PRN
  Administered 2019-06-12 (×3): 1 mg via INTRAVENOUS

## 2019-06-12 MED ORDER — FENTANYL CITRATE (PF) 100 MCG/2ML IJ SOLN
INTRAMUSCULAR | Status: DC | PRN
  Administered 2019-06-12 (×4): 25 ug via INTRAVENOUS

## 2019-06-12 MED ORDER — ONDANSETRON HCL 4 MG/2ML IJ SOLN: 4.0000 mg | Freq: Four times a day (QID) | INTRAMUSCULAR | Status: DC | PRN

## 2019-06-12 MED ORDER — SIMVASTATIN 40 MG PO TABS
40.0000 mg | ORAL_TABLET | Freq: Every day | ORAL | Status: DC
  Administered 2019-06-12: 40 mg via ORAL
  Filled 2019-06-12 (×2): qty 1
  Filled 2019-06-12: qty 2

## 2019-06-12 MED ORDER — DULOXETINE HCL 30 MG PO CPEP
30.0000 mg | ORAL_CAPSULE | Freq: Every day | ORAL | Status: DC
  Administered 2019-06-13: 30 mg via ORAL
  Filled 2019-06-12: qty 1

## 2019-06-12 MED ORDER — HEPARIN (PORCINE) IN NACL 1000-0.9 UT/500ML-% IV SOLN
INTRAVENOUS | Status: AC
  Filled 2019-06-12: qty 1000

## 2019-06-12 MED ORDER — HEPARIN SODIUM (PORCINE) 1000 UNIT/ML IJ SOLN
INTRAMUSCULAR | Status: DC | PRN
  Administered 2019-06-12: 6000 [IU] via INTRAVENOUS
  Administered 2019-06-12: 3000 [IU] via INTRAVENOUS
  Administered 2019-06-12: 4000 [IU] via INTRAVENOUS
  Administered 2019-06-12: 2000 [IU] via INTRAVENOUS

## 2019-06-12 MED ORDER — HYDROCHLOROTHIAZIDE 12.5 MG PO CAPS
12.5000 mg | ORAL_CAPSULE | Freq: Every day | ORAL | Status: DC
  Administered 2019-06-13: 12.5 mg via ORAL
  Filled 2019-06-12: qty 1

## 2019-06-12 SURGICAL SUPPLY — 15 items
BALLN TREK RX 2.25X12 (BALLOONS) ×4
BALLOON TREK RX 2.25X12 (BALLOONS) ×2 IMPLANT
CATH 5F 110X4 TIG (CATHETERS) ×4 IMPLANT
CATH INFINITI 5FR ANG PIGTAIL (CATHETERS) ×4 IMPLANT
CATH INFINITI JR4 5F (CATHETERS) ×4 IMPLANT
CATH VISTA GUIDE 6FR JL3.5 (CATHETERS) ×4 IMPLANT
DEVICE INFLAT 30 PLUS (MISCELLANEOUS) ×4 IMPLANT
DEVICE RAD TR BAND REGULAR (VASCULAR PRODUCTS) ×4 IMPLANT
GLIDESHEATH SLEND SS 6F .021 (SHEATH) ×4 IMPLANT
KIT MANI 3VAL PERCEP (MISCELLANEOUS) ×4 IMPLANT
PACK CARDIAC CATH (CUSTOM PROCEDURE TRAY) ×4 IMPLANT
STENT RESOLUTE ONYX 2.5X12 (Permanent Stent) ×4 IMPLANT
WIRE ASAHI PROWATER 180CM (WIRE) ×4 IMPLANT
WIRE HITORQ VERSACORE ST 145CM (WIRE) ×4 IMPLANT
WIRE ROSEN-J .035X260CM (WIRE) ×8 IMPLANT

## 2019-06-13 ENCOUNTER — Encounter: Payer: Self-pay | Admitting: Cardiology

## 2019-06-13 DIAGNOSIS — Z955 Presence of coronary angioplasty implant and graft: Secondary | ICD-10-CM | POA: Diagnosis not present

## 2019-06-13 DIAGNOSIS — I2510 Atherosclerotic heart disease of native coronary artery without angina pectoris: Secondary | ICD-10-CM | POA: Diagnosis not present

## 2019-06-13 LAB — BASIC METABOLIC PANEL
Anion gap: 10 (ref 5–15)
BUN: 15 mg/dL (ref 8–23)
CO2: 25 mmol/L (ref 22–32)
Calcium: 9.1 mg/dL (ref 8.9–10.3)
Chloride: 105 mmol/L (ref 98–111)
Creatinine, Ser: 1.06 mg/dL — ABNORMAL HIGH (ref 0.44–1.00)
GFR calc Af Amer: >60 mL/min (ref >60)
GFR calc non Af Amer: 52 mL/min — ABNORMAL LOW (ref >60)
Glucose, Bld: 131 mg/dL — ABNORMAL HIGH (ref 70–99)
Potassium: 3.7 mmol/L (ref 3.5–5.1)
Sodium: 140 mmol/L (ref 135–145)

## 2019-06-13 LAB — CBC
HCT: 35.3 % — ABNORMAL LOW (ref 36.0–46.0)
Hemoglobin: 11.8 g/dL — ABNORMAL LOW (ref 12.0–15.0)
MCH: 30.1 pg (ref 26.0–34.0)
MCHC: 33.4 g/dL (ref 30.0–36.0)
MCV: 90.1 fL (ref 80.0–100.0)
Platelets: 264 10*3/uL (ref 150–400)
RBC: 3.92 MIL/uL (ref 3.87–5.11)
RDW: 12.3 % (ref 11.5–15.5)
WBC: 7.5 10*3/uL (ref 4.0–10.5)
nRBC: 0 % (ref 0.0–0.2)

## 2019-06-13 MED ORDER — TICAGRELOR 90 MG PO TABS: 90.0000 mg | ORAL_TABLET | Freq: Two times a day (BID) | ORAL | 11 refills | Status: DC

## 2019-06-13 NOTE — Progress Notes (Signed)
Patient discharged to home. Tele and IV d/c'd prior to discharge.  Patient verbalizes understanding of discharge instructions and radial site care.

## 2019-06-13 NOTE — Discharge Summary (Signed)
Physician Discharge Summary  Patient ID: Tamara Villegas MRN: [id number] DOB/AGE: 73-17-1947 73 y.o.  Admit date: 06/12/2019 Discharge date: 06/13/2019  Primary Discharge Diagnosis Coronary artery disease Secondary Discharge Diagnosis same  Significant Diagnostic Studies: angiography  Consults: None  Hospital Course: 73 year old female with a 54-month history of decreased energy, chest pain, progressive exertional dyspnea, and syncope, with an abnormal Lexiscan Myoview, revealing mild to moderate anteroapical ischemia.  The patient elected for elective cardiac catheterization for further evaluation. Cardiac catheterization on 06/12/2019 revealed preserved left ventricular function with 80% stenosis mid LAD and mild to moderate aortic insufficiency.  The patient underwent successful PCI with Resolute Onyx 2.5 x 12 mm drug-eluting stent to the mid LAD.  The patient denies chest pain or pain at right wrist. She noted mild shortness of breath and palpitations when ambulating to the bathroom this morning, but not significant. She has ambulated in her room. She is eager to go home today.   Discharge Exam: Blood pressure 131/65, pulse 82, temperature 98.7 F (37.1 C), temperature source Oral, resp. rate 19, height 5\' 5"  (1.651 m), weight 82.3 kg, SpO2 99 %.   General appearance: alert, cooperative, appears stated age and no distress Head: Normocephalic, without obvious abnormality, atraumatic Eyes: negative Resp: normal effort of breathing on room air. Cardio: regular rate and rhythm Neurologic: Grossly normal Incision/Wound:right wrist pressure gauze in place with no dried or active bleeding Labs:   Lab Results  Component Value Date   WBC 7.5 06/13/2019   HGB 11.8 (L) 06/13/2019   HCT 35.3 (L) 06/13/2019   MCV 90.1 06/13/2019   PLT 264 06/13/2019    Recent Labs  Lab 06/13/19 0541  NA 140  K 3.7  CL 105  CO2 25  BUN 15  CREATININE 1.06*  CALCIUM 9.1  GLUCOSE 131*       EKG:  normal sinus rhythm  FOLLOW UP PLANS AND APPOINTMENTS Discharge Instructions    AMB Referral to Cardiac Rehabilitation - Phase II   Complete by: As directed    Diagnosis: Coronary Stents   After initial evaluation and assessments completed: Virtual Based Care may be provided alone or in conjunction with Phase 2 Cardiac Rehab based on patient barriers.: Yes     Allergies as of 06/13/2019      Reactions   Sudafed [pseudoephedrine]    "Hyper"      Medication List    TAKE these medications   acetaminophen 325 MG tablet Commonly known as: TYLENOL Take 650 mg by mouth every 6 (six) hours as needed for moderate pain or headache.   aspirin 81 MG tablet Take 81 mg by mouth daily.   CALCIUM 600 + D PO Take 1 tablet by mouth 2 (two) times daily.   DULoxetine 30 MG capsule Commonly known as: CYMBALTA Take 30 mg by mouth daily.   Fish Oil 1000 MG Caps Take 1,000-2,000 mg by mouth See admin instructions. Take 1000 mg in the morning and 2000 mg at night   latanoprost 0.005 % ophthalmic solution Commonly known as: XALATAN Place 1 drop into both eyes at bedtime.   lisinopril-hydrochlorothiazide 20-12.5 MG tablet Commonly known as: ZESTORETIC Take 1 tablet by mouth daily.   multivitamin tablet Take 1 tablet by mouth daily.   simvastatin 40 MG tablet Commonly known as: ZOCOR Take 40 mg by mouth at bedtime.   ticagrelor 90 MG Tabs tablet Commonly known as: BRILINTA Take 1 tablet (90 mg total) by mouth 2 (two) times daily.  timolol 0.5 % ophthalmic solution Commonly known as: BETIMOL Place 1 drop into both eyes daily.      Follow-up Information    [facility] Follow up.   Specialty: Cardiac Rehabilitation Why: Your Cardiologist has referred you to outpatient Cardiac Rehab at [facility].  The Cardiac Rehab department will contact you by phone within one to two weeks of discharge to schedule your first appointment.   Contact information: [id number] [place] [phone]       Isaias Cowman, MD Follow up in 1 week(s).   Specialty: Cardiology Contact information: [facility] [place] [address] [phone]           BRING ALL MEDICATIONS WITH YOU TO FOLLOW UP APPOINTMENTS  Time spent with patient to include physician time: 25 minutes Signed:  Clabe Seal PA-C 06/13/2019, 8:26 AM

## 2019-07-10 ENCOUNTER — Encounter: Payer: BC Managed Care – PPO | Attending: Cardiology | Admitting: *Deleted

## 2019-07-10 ENCOUNTER — Other Ambulatory Visit: Payer: Self-pay

## 2019-07-10 DIAGNOSIS — Z955 Presence of coronary angioplasty implant and graft: Secondary | ICD-10-CM

## 2019-07-10 NOTE — Progress Notes (Signed)
Completed virtutal orientation.  Documentation for diagnosis can be found in [facility] encounter 06/12/19.  EP eval scheduled for Thursday 11/5.

## 2019-07-18 ENCOUNTER — Other Ambulatory Visit: Payer: Self-pay

## 2019-07-18 ENCOUNTER — Encounter: Payer: BC Managed Care – PPO | Attending: Cardiology

## 2019-07-18 DIAGNOSIS — Z79899 Other long term (current) drug therapy: Secondary | ICD-10-CM | POA: Diagnosis not present

## 2019-07-18 DIAGNOSIS — Z7982 Long term (current) use of aspirin: Secondary | ICD-10-CM | POA: Diagnosis not present

## 2019-07-18 DIAGNOSIS — E7800 Pure hypercholesterolemia, unspecified: Secondary | ICD-10-CM | POA: Diagnosis not present

## 2019-07-18 DIAGNOSIS — I10 Essential (primary) hypertension: Secondary | ICD-10-CM | POA: Diagnosis not present

## 2019-07-18 DIAGNOSIS — G629 Polyneuropathy, unspecified: Secondary | ICD-10-CM | POA: Insufficient documentation

## 2019-07-18 DIAGNOSIS — Z87891 Personal history of nicotine dependence: Secondary | ICD-10-CM | POA: Insufficient documentation

## 2019-07-18 DIAGNOSIS — Z955 Presence of coronary angioplasty implant and graft: Secondary | ICD-10-CM | POA: Insufficient documentation

## 2019-07-18 NOTE — Patient Instructions (Signed)
Patient Instructions  Patient Details  Name: Tamara Villegas MRN: [id number] Date of Birth: 1946-07-03 Referring Provider:  Isaias Cowman, MD  Below are your personal goals for exercise, nutrition, and risk factors. Our goal is to help you stay on track towards obtaining and maintaining these goals. We will be discussing your progress on these goals with you throughout the program.  Initial Exercise Prescription: Initial Exercise Prescription - 07/18/19 1400      Date of Initial Exercise RX and Referring Provider   Date  07/18/19    Referring Provider  Paraschos      Treadmill   MPH  2    Grade  0.5    Minutes  15    METs  2.6      Recumbant Bike   Level  2    RPM  60    Watts  18    Minutes  15    METs  2.6      NuStep   Level  2    SPM  80    Minutes  15    METs  2.6      REL-XR   Level  2    Speed  50    Minutes  15    METs  2.6      Biostep-RELP   Level  2    SPM  50    Minutes  15    METs  2.6      Prescription Details   Frequency (times per week)  3    Duration  Progress to 30 minutes of continuous aerobic without signs/symptoms of physical distress      Intensity   THRR 40-80% of Max Heartrate  98-130    Ratings of Perceived Exertion  11-15      Resistance Training   Training Prescription  Yes    Weight  4 lb    Reps  10-15       Exercise Goals: Frequency: Be able to perform aerobic exercise two to three times per week in program working toward 2-5 days per week of home exercise.  Intensity: Work with a perceived exertion of 11 (fairly light) - 15 (hard) while following your exercise prescription.  We will make changes to your prescription with you as you progress through the program.   Duration: Be able to do 30 to 45 minutes of continuous aerobic exercise in addition to a 5 minute warm-up and a 5 minute cool-down routine.   Nutrition Goals: Your personal nutrition goals will be established when you do your nutrition analysis with the  dietician.  The following are general nutrition guidelines to follow: Cholesterol < 200mg /day Sodium < 1500mg /day Fiber: Women over 50 yrs - 21 grams per day  Personal Goals: Personal Goals and Risk Factors at Admission - 07/18/19 1442      Core Components/Risk Factors/Patient Goals on Admission    Weight Management  Yes;Weight Loss    Intervention  Weight Management: Develop a combined nutrition and exercise program designed to reach desired caloric intake, while maintaining appropriate intake of nutrient and fiber, sodium and fats, and appropriate energy expenditure required for the weight goal.;Weight Management: Provide education and appropriate resources to help participant work on and attain dietary goals.    Admit Weight  188 lb 6.4 oz (85.5 kg)    Goal Weight: Short Term  180 lb (81.6 kg)    Goal Weight: Long Term  175 lb (79.4 kg)    Expected  Outcomes  Short Term: Continue to assess and modify interventions until short term weight is achieved;Long Term: Adherence to nutrition and physical activity/exercise program aimed toward attainment of established weight goal;Weight Loss: Understanding of general recommendations for a balanced deficit meal plan, which promotes 1-2 lb weight loss per week and includes a negative energy balance of [phone] kcal/d;Understanding recommendations for meals to include 15-35% energy as protein, 25-35% energy from fat, 35-60% energy from carbohydrates, less than 200mg  of dietary cholesterol, 20-35 gm of total fiber daily;Understanding of distribution of calorie intake throughout the day with the consumption of 4-5 meals/snacks    Intervention  Provide education on lifestyle modifcations including regular physical activity/exercise, weight management, moderate sodium restriction and increased consumption of fresh fruit, vegetables, and low fat dairy, alcohol moderation, and smoking cessation.;Monitor prescription use compliance.    Expected Outcomes  Short  Term: Continued assessment and intervention until BP is < 140/23mm HG in hypertensive participants. < 130/96mm HG in hypertensive participants with diabetes, heart failure or chronic kidney disease.;Long Term: Maintenance of blood pressure at goal levels.       Tobacco Use Initial Evaluation: Social History   Tobacco Use  Smoking Status Former Smoker  Smokeless Tobacco Never Used  Tobacco Comment   as teenager    Exercise Goals and Review: Exercise Goals    Row Name 07/18/19 1443             Exercise Goals   Increase Physical Activity  Yes       Intervention  Provide advice, education, support and counseling about physical activity/exercise needs.;Develop an individualized exercise prescription for aerobic and resistive training based on initial evaluation findings, risk stratification, comorbidities and participant's personal goals.       Expected Outcomes  Short Term: Attend rehab on a regular basis to increase amount of physical activity.;Long Term: Add in home exercise to make exercise part of routine and to increase amount of physical activity.;Long Term: Exercising regularly at least 3-5 days a week.       Increase Strength and Stamina  Yes       Intervention  Provide advice, education, support and counseling about physical activity/exercise needs.;Develop an individualized exercise prescription for aerobic and resistive training based on initial evaluation findings, risk stratification, comorbidities and participant's personal goals.       Expected Outcomes  Short Term: Increase workloads from initial exercise prescription for resistance, speed, and METs.;Short Term: Perform resistance training exercises routinely during rehab and add in resistance training at home;Long Term: Improve cardiorespiratory fitness, muscular endurance and strength as measured by increased METs and functional capacity (6MWT)       Able to understand and use rate of perceived exertion (RPE) scale  Yes        Intervention  Provide education and explanation on how to use RPE scale       Expected Outcomes  Short Term: Able to use RPE daily in rehab to express subjective intensity level;Long Term:  Able to use RPE to guide intensity level when exercising independently       Knowledge and understanding of Target Heart Rate Range (THRR)  Yes       Intervention  Provide education and explanation of THRR including how the numbers were predicted and where they are located for reference       Expected Outcomes  Short Term: Able to state/look up THRR;Short Term: Able to use daily as guideline for intensity in rehab;Long Term: Able to use THRR to govern  intensity when exercising independently       Able to check pulse independently  Yes       Intervention  Provide education and demonstration on how to check pulse in carotid and radial arteries.;Review the importance of being able to check your own pulse for safety during independent exercise       Expected Outcomes  Short Term: Able to explain why pulse checking is important during independent exercise;Long Term: Able to check pulse independently and accurately       Understanding of Exercise Prescription  Yes       Intervention  Provide education, explanation, and written materials on patient's individual exercise prescription       Expected Outcomes  Short Term: Able to explain program exercise prescription;Long Term: Able to explain home exercise prescription to exercise independently          Copy of goals given to participant.

## 2019-07-18 NOTE — Progress Notes (Signed)
Cardiac Individual Treatment Plan  Patient Details  Name: Tamara Villegas MRN: [id number] Date of Birth: [birth year] Referring Provider:     Cardiac Rehab from 07/18/2019 in [facility] Cardiac and Pulmonary Rehab  Referring Provider  Paraschos      Initial Encounter Date:    Cardiac Rehab [facility]/18/[facility]  Date  07/18/19      Visit Diagnosis: S/P coronary artery stent placement  Patient's Home Medications on Admission:  Current Outpatient Medications:  .  acetaminophen (TYLENOL) 325 MG tablet, Take 650 mg by mouth every 6 (six) hours as needed for moderate pain or headache., Disp: , Rfl:  .  aspirin 81 MG tablet, Take 81 mg by mouth daily., Disp: , Rfl:  .  Calcium Carb-Cholecalciferol (CALCIUM 600 + D PO), Take 1 tablet by mouth 2 (two) times daily., Disp: , Rfl:  .  DULoxetine (CYMBALTA) 30 MG capsule, Take 30 mg by mouth daily., Disp: , Rfl:  .  latanoprost (XALATAN) 0.005 % ophthalmic solution, Place 1 drop into both eyes at bedtime., Disp: , Rfl:  .  lisinopril (ZESTRIL) 20 MG tablet, Take 20 mg by mouth daily., Disp: , Rfl:  .  lisinopril-hydrochlorothiazide (ZESTORETIC) 20-12.5 MG tablet, Take 1 tablet by mouth daily., Disp: , Rfl:  .  Multiple Vitamin (MULTIVITAMIN) tablet, Take 1 tablet by mouth daily., Disp: , Rfl:  .  Omega-3 Fatty Acids (FISH OIL) 1000 MG CAPS, Take 1,000-2,000 mg by mouth See admin instructions. Take 1000 mg in the morning and 2000 mg at night, Disp: , Rfl:  .  simvastatin (ZOCOR) 40 MG tablet, Take 40 mg by mouth at bedtime., Disp: , Rfl:  .  ticagrelor (BRILINTA) 90 MG TABS tablet, Take 1 tablet (90 mg total) by mouth 2 (two) times daily., Disp: 60 tablet, Rfl: 11 .  timolol (BETIMOL) 0.5 % ophthalmic solution, Place 1 drop into both eyes daily., Disp: , Rfl:   Past Medical History: Past Medical History:  Diagnosis Date  . Hepatitis 1993   B.   . Hypercholesteremia   . Hypertension   . Neuropathy    feet  . Wears  dentures    full upper, partial lower    Tobacco Use: Social History   Tobacco Use  Smoking Status Former Smoker  Smokeless Tobacco Never Used  Tobacco Comment   as teenager    Labs: Recent Review Flowsheet Data    There is no flowsheet data to display.       Exercise Target Goals: Exercise Program Goal: Individual exercise prescription set using results from initial 6 min walk test and THRR while considering  patient's activity barriers and safety.   Exercise Prescription Goal: Initial exercise prescription builds to 30-45 minutes a day of aerobic activity, 2-3 days per week.  Home exercise guidelines will be given to patient during program as part of exercise prescription that the participant will acknowledge.  Activity Barriers & Risk Stratification: Activity Barriers & Cardiac Risk Stratification - 07/10/19 1119      Activity Barriers & Cardiac Risk Stratification   Activity Barriers  Deconditioning;Other (comment);Balance Concerns;Muscular Weakness;History of Falls    Comments  nueropathy in both feet, twisted left ankle in August (Still bothering her), dizzy spells recently    Cardiac Risk Stratification  Moderate       6 Minute Walk: 6 Minute Walk    Row Name 07/18/19 1428         6 Minute Walk   Phase  Initial     Distance  1200 feet     Walk Time  6 minutes        Oxygen Initial Assessment:   Oxygen Re-Evaluation:   Oxygen Discharge (Final Oxygen Re-Evaluation):   Initial Exercise Prescription: Initial Exercise Prescription - 07/18/19 1400      Date of Initial Exercise RX and Referring Provider   Date  07/18/19    Referring Provider  Paraschos      Treadmill   MPH  2    Grade  0.5    Minutes  15    METs  2.6      Recumbant Bike   Level  2    RPM  60    Watts  18    Minutes  15    METs  2.6      NuStep   Level  2    SPM  80    Minutes  15    METs  2.6      REL-XR   Level  2    Speed  50    Minutes  15    METs  2.6       Biostep-RELP   Level  2    SPM  50    Minutes  15    METs  2.6      Prescription Details   Frequency (times per week)  3    Duration  Progress to 30 minutes of continuous aerobic without signs/symptoms of physical distress      Intensity   THRR 40-80% of Max Heartrate  98-130    Ratings of Perceived Exertion  11-15      Resistance Training   Training Prescription  Yes    Weight  4 lb    Reps  10-15       Perform Capillary Blood Glucose checks as needed.  Exercise Prescription Changes:   Exercise Comments:   Exercise Goals and Review: Exercise Goals    Row Name 07/18/19 1443             Exercise Goals   Increase Physical Activity  Yes       Intervention  Provide advice, education, support and counseling about physical activity/exercise needs.;Develop an individualized exercise prescription for aerobic and resistive training based on initial evaluation findings, risk stratification, comorbidities and participant's personal goals.       Expected Outcomes  Short Term: Attend rehab on a regular basis to increase amount of physical activity.;Long Term: Add in home exercise to make exercise part of routine and to increase amount of physical activity.;Long Term: Exercising regularly at least 3-5 days a week.       Increase Strength and Stamina  Yes       Intervention  Provide advice, education, support and counseling about physical activity/exercise needs.;Develop an individualized exercise prescription for aerobic and resistive training based on initial evaluation findings, risk stratification, comorbidities and participant's personal goals.       Expected Outcomes  Short Term: Increase workloads from initial exercise prescription for resistance, speed, and METs.;Short Term: Perform resistance training exercises routinely during rehab and add in resistance training at home;Long Term: Improve cardiorespiratory fitness, muscular endurance and strength as measured by increased  METs and functional capacity (6MWT)       Able to understand and use rate of perceived exertion (RPE) scale  Yes       Intervention  Provide education and explanation on how to use RPE scale  Expected Outcomes  Short Term: Able to use RPE daily in rehab to express subjective intensity level;Long Term:  Able to use RPE to guide intensity level when exercising independently       Knowledge and understanding of Target Heart Rate Range (THRR)  Yes       Intervention  Provide education and explanation of THRR including how the numbers were predicted and where they are located for reference       Expected Outcomes  Short Term: Able to state/look up THRR;Short Term: Able to use daily as guideline for intensity in rehab;Long Term: Able to use THRR to govern intensity when exercising independently       Able to check pulse independently  Yes       Intervention  Provide education and demonstration on how to check pulse in carotid and radial arteries.;Review the importance of being able to check your own pulse for safety during independent exercise       Expected Outcomes  Short Term: Able to explain why pulse checking is important during independent exercise;Long Term: Able to check pulse independently and accurately       Understanding of Exercise Prescription  Yes       Intervention  Provide education, explanation, and written materials on patient's individual exercise prescription       Expected Outcomes  Short Term: Able to explain program exercise prescription;Long Term: Able to explain home exercise prescription to exercise independently          Exercise Goals Re-Evaluation :   Discharge Exercise Prescription (Final Exercise Prescription Changes):   Nutrition:  Target Goals: Understanding of nutrition guidelines, daily intake of sodium <1558m, cholesterol <2058m calories 30% from fat and 7% or less from saturated fats, daily to have 5 or more servings of fruits and  vegetables.  Biometrics:    Nutrition Therapy Plan and Nutrition Goals:   Nutrition Assessments: Nutrition Assessments - 07/18/19 1418      MEDFICTS Scores   Pre Score  42       Nutrition Goals Re-Evaluation:   Nutrition Goals Discharge (Final Nutrition Goals Re-Evaluation):   Psychosocial: Target Goals: Acknowledge presence or absence of significant depression and/or stress, maximize coping skills, provide positive support system. Participant is able to verbalize types and ability to use techniques and skills needed for reducing stress and depression.   Initial Review & Psychosocial Screening: Initial Psych Review & Screening - 07/10/19 1112      Initial Review   Current issues with  Current Stress Concerns    Source of Stress Concerns  Chronic Illness;Unable to participate in former interests or hobbies;Unable to perform yard/household activities    Comments  Current stressor is health with having dizzy spells and risk of passing out.  She  has been lucky enought that they have not happened at work.  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      Family Dynamics   Good Support System?  Yes   Husband and daughter and granddaughter lives nearby     Barriers   Psychosocial barriers to participate in program  The patient should benefit from training in stress management and relaxation.;Psychosocial barriers identified (see note)      Screening Interventions   Interventions  Provide feedback about the scores to participant;To provide support and resources with identified psychosocial needs;Encouraged to exercise    Expected Outcomes  Short Term goal: Utilizing psychosocial counselor, staff and physician to assist with identification of specific  Stressors or current issues interfering with healing process. Setting desired goal for each stressor or current issue identified.;Long Term Goal: Stressors or current issues are controlled or eliminated.;Short Term  goal: Identification and review with participant of any Quality of Life or Depression concerns found by scoring the questionnaire.;Long Term goal: The participant improves quality of Life and PHQ9 Scores as seen by post scores and/or verbalization of changes       Quality of Life Scores:  Quality of Life - 07/18/19 1437      Quality of Life   Select  Quality of Life      Quality of Life Scores   Health/Function Pre  21.93 %    Socioeconomic Pre  28.75 %    Psych/Spiritual Pre  29.14 %    Family Pre  28.8 %    GLOBAL Pre  25.91 %      Scores of 19 and below usually indicate a poorer quality of life in these areas.  A difference of  2-3 points is a clinically meaningful difference.  A difference of 2-3 points in the total score of the Quality of Life Index has been associated with significant improvement in overall quality of life, self-image, physical symptoms, and general health in studies assessing change in quality of life.  PHQ-9: Recent Review Flowsheet Data    Depression screen [facility] 2/9 07/18/2019   Decreased Interest 0   Down, Depressed, Hopeless 0   PHQ - 2 Score 0   Altered sleeping 0   Tired, decreased energy 3   Change in appetite 0   Feeling bad or failure about yourself  0   Trouble concentrating 0   Moving slowly or fidgety/restless 0   Suicidal thoughts 0   PHQ-9 Score 3   Difficult doing work/chores Not difficult at all     Interpretation of Total Score  Total Score Depression Severity:  1-4 = Minimal depression, 5-9 = Mild depression, 10-14 = Moderate depression, 15-19 = Moderately severe depression, 20-27 = Severe depression   Psychosocial Evaluation and Intervention: Psychosocial Evaluation - 07/10/19 1140      Psychosocial Evaluation & Interventions   Interventions  Stress management education;Encouraged to exercise with the program and follow exercise prescription    Comments  Maury is doing well.  She has a strong support system. Her biggest stressors are  her current health state as they try to solve her dizzy spells.  She is looking forward to exercise.  She also has a history of neuropathy in both feet that only limits her occassionally.  She has a granddaughter that lives near by but hasn't seen since March due to [place].  She also has a leaky valve that they are continuing to monitor.  She will need the 5pm time slot due to work.    Expected Outcomes  Short: Attend rehab regularly around work schedule.  Long: Continue to cope positively.    Continue Psychosocial Services   Follow up required by staff       Psychosocial Re-Evaluation:   Psychosocial Discharge (Final Psychosocial Re-Evaluation):   Vocational Rehabilitation: Provide vocational rehab assistance to qualifying candidates.   Vocational Rehab Evaluation & Intervention: Vocational Rehab - 07/10/19 1118      Initial Vocational Rehab Evaluation & Intervention   Assessment shows need for Vocational Rehabilitation  No       Education: Education Goals: Education classes will be provided on a variety of topics geared toward better understanding of heart health and risk factor  modification. Participant will state understanding/return demonstration of topics presented as noted by education test scores.  Learning Barriers/Preferences: Learning Barriers/Preferences - 07/10/19 1117      Learning Barriers/Preferences   Learning Barriers  Sight   glasses   Learning Preferences  Written Material;Verbal Instruction;Skilled Demonstration       Education Topics:  AED/CPR: - Group verbal and written instruction with the use of models to demonstrate the basic use of the AED with the basic ABC's of resuscitation.   General Nutrition Guidelines/Fats and Fiber: -Group instruction provided by verbal, written material, models and posters to present the general guidelines for heart healthy nutrition. Gives an explanation and review of dietary fats and fiber.   Controlling  Sodium/Reading Food Labels: -Group verbal and written material supporting the discussion of sodium use in heart healthy nutrition. Review and explanation with models, verbal and written materials for utilization of the food label.   Exercise Physiology & General Exercise Guidelines: - Group verbal and written instruction with models to review the exercise physiology of the cardiovascular system and associated critical values. Provides general exercise guidelines with specific guidelines to those with heart or lung disease.    Aerobic Exercise & Resistance Training: - Gives group verbal and written instruction on the various components of exercise. Focuses on aerobic and resistive training programs and the benefits of this training and how to safely progress through these programs..   Flexibility, Balance, Mind/Body Relaxation: Provides group verbal/written instruction on the benefits of flexibility and balance training, including mind/body exercise modes such as yoga, pilates and tai chi.  Demonstration and skill practice provided.   Stress and Anxiety: - Provides group verbal and written instruction about the health risks of elevated stress and causes of high stress.  Discuss the correlation between heart/lung disease and anxiety and treatment options. Review healthy ways to manage with stress and anxiety.   Depression: - Provides group verbal and written instruction on the correlation between heart/lung disease and depressed mood, treatment options, and the stigmas associated with seeking treatment.   Anatomy & Physiology of the Heart: - Group verbal and written instruction and models provide basic cardiac anatomy and physiology, with the coronary electrical and arterial systems. Review of Valvular disease and Heart Failure   Cardiac Procedures: - Group verbal and written instruction to review commonly prescribed medications for heart disease. Reviews the medication, class of the drug,  and side effects. Includes the steps to properly store meds and maintain the prescription regimen. (beta blockers and nitrates)   Cardiac Medications I: - Group verbal and written instruction to review commonly prescribed medications for heart disease. Reviews the medication, class of the drug, and side effects. Includes the steps to properly store meds and maintain the prescription regimen.   Cardiac Medications II: -Group verbal and written instruction to review commonly prescribed medications for heart disease. Reviews the medication, class of the drug, and side effects. (all other drug classes)    Go Sex-Intimacy & Heart Disease, Get SMART - Goal Setting: - Group verbal and written instruction through game format to discuss heart disease and the return to sexual intimacy. Provides group verbal and written material to discuss and apply goal setting through the application of the S.M.A.R.T. Method.   Other Matters of the Heart: - Provides group verbal, written materials and models to describe Stable Angina and Peripheral Artery. Includes description of the disease process and treatment options available to the cardiac patient.   Exercise & Equipment Safety: - Individual verbal instruction  and demonstration of equipment use and safety with use of the equipment.   Cardiac Rehab from 07/18/2019 in [facility] Cardiac and Pulmonary Rehab  Date  07/18/19  Educator  AS  Instruction Review Code  1- Verbalizes Understanding      Infection Prevention: - Provides verbal and written material to individual with discussion of infection control including proper hand washing and proper equipment cleaning during exercise session.   Cardiac Rehab from 07/18/2019 in [facility] Cardiac and Pulmonary Rehab  Date  07/18/19  Educator  AS  Instruction Review Code  1- Verbalizes Understanding      Falls Prevention: - Provides verbal and written material to individual with discussion of falls prevention and  safety.   Cardiac Rehab from 07/18/2019 in [facility] Cardiac and Pulmonary Rehab  Date  07/18/19  Educator  AS  Instruction Review Code  1- Verbalizes Understanding      Diabetes: - Individual verbal and written instruction to review signs/symptoms of diabetes, desired ranges of glucose level fasting, after meals and with exercise. Acknowledge that pre and post exercise glucose checks will be done for 3 sessions at entry of program.   Know Your Numbers and Risk Factors: -Group verbal and written instruction about important numbers in your health.  Discussion of what are risk factors and how they play a role in the disease process.  Review of Cholesterol, Blood Pressure, Diabetes, and BMI and the role they play in your overall health.   Sleep Hygiene: -Provides group verbal and written instruction about how sleep can affect your health.  Define sleep hygiene, discuss sleep cycles and impact of sleep habits. Review good sleep hygiene tips.    Other: -Provides group and verbal instruction on various topics (see comments)   Knowledge Questionnaire Score: Knowledge Questionnaire Score - 07/18/19 1438      Knowledge Questionnaire Score   Pre Score  22/26 HF exercise nutrition       Core Components/Risk Factors/Patient Goals at Admission: Personal Goals and Risk Factors at Admission - 07/18/19 1442      Core Components/Risk Factors/Patient Goals on Admission    Weight Management  Yes;Weight Loss    Intervention  Weight Management: Develop a combined nutrition and exercise program designed to reach desired caloric intake, while maintaining appropriate intake of nutrient and fiber, sodium and fats, and appropriate energy expenditure required for the weight goal.;Weight Management: Provide education and appropriate resources to help participant work on and attain dietary goals.    Admit Weight  188 lb 6.4 oz (85.5 kg)    Goal Weight: Short Term  180 lb (81.6 kg)    Goal Weight: Long Term  175  lb (79.4 kg)    Expected Outcomes  Short Term: Continue to assess and modify interventions until short term weight is achieved;Long Term: Adherence to nutrition and physical activity/exercise program aimed toward attainment of established weight goal;Weight Loss: Understanding of general recommendations for a balanced deficit meal plan, which promotes 1-2 lb weight loss per week and includes a negative energy balance of [phone] kcal/d;Understanding recommendations for meals to include 15-35% energy as protein, 25-35% energy from fat, 35-60% energy from carbohydrates, less than 273m of dietary cholesterol, 20-35 gm of total fiber daily;Understanding of distribution of calorie intake throughout the day with the consumption of 4-5 meals/snacks    Intervention  Provide education on lifestyle modifcations including regular physical activity/exercise, weight management, moderate sodium restriction and increased consumption of fresh fruit, vegetables, and low fat dairy, alcohol moderation, and smoking cessation.;Monitor  prescription use compliance.    Expected Outcomes  Short Term: Continued assessment and intervention until BP is < 140/69m HG in hypertensive participants. < 130/84mHG in hypertensive participants with diabetes, heart failure or chronic kidney disease.;Long Term: Maintenance of blood pressure at goal levels.       Core Components/Risk Factors/Patient Goals Review:    Core Components/Risk Factors/Patient Goals at Discharge (Final Review):    ITP Comments: ITP Comments    Row Name 07/10/19 1139           ITP Comments  Completed virtutal orientation.  [facility]  EP eval scheduled for Thursday 11/5.          Comments: initial ITP

## 2019-07-25 ENCOUNTER — Other Ambulatory Visit: Payer: Self-pay

## 2019-07-25 DIAGNOSIS — Z955 Presence of coronary angioplasty implant and graft: Secondary | ICD-10-CM

## 2019-07-25 NOTE — Progress Notes (Signed)
Completed Initial RD eval 

## 2019-08-05 ENCOUNTER — Other Ambulatory Visit: Payer: Self-pay

## 2019-08-05 ENCOUNTER — Encounter: Payer: BC Managed Care – PPO | Admitting: *Deleted

## 2019-08-05 DIAGNOSIS — Z955 Presence of coronary angioplasty implant and graft: Secondary | ICD-10-CM

## 2019-08-05 NOTE — Progress Notes (Signed)
Daily Session Note  Patient Details  Name: Tamara Villegas MRN: [id number] Date of Birth: 02/12/1946 Referring Provider:     Cardiac Rehab from 07/18/2019 in [facility] Cardiac and Pulmonary Rehab  Referring Provider  Paraschos      Encounter Date: 08/05/2019  Check In: Session Check In - 08/05/19 1650      Check-In   Supervising physician immediately available to respond to emergencies  See telemetry face sheet for immediately available ER MD    Location  [facility]-Cardiac & Pulmonary Rehab    Staff Present  Meredith Craven, RN BSN;Kelly Hayes, BS, ACSM CEP, Exercise Physiologist;Jeanna Durrell BS, Exercise Physiologist    Virtual Visit  No    Medication changes reported      No    Fall or balance concerns reported     No    Warm-up and Cool-down  Performed on first and last piece of equipment    Resistance Training Performed  Yes    VAD Patient?  No    PAD/SET Patient?  No      Pain Assessment   Currently in Pain?  No/denies          Social History   Tobacco Use  Smoking Status Former Smoker  Smokeless Tobacco Never Used  Tobacco Comment   as teenager    Goals Met:  Independence with exercise equipment Exercise tolerated well No report of cardiac concerns or symptoms Strength training completed today  Goals Unmet:  Not Applicable  Comments: First full day of exercise!  Patient was oriented to gym and equipment including functions, settings, policies, and procedures.  Patient's individual exercise prescription and treatment plan were reviewed.  All starting workloads were established based on the results of the 6 minute walk test done at initial orientation visit.  The plan for exercise progression was also introduced and progression will be customized based on patient's performance and goals.     Dr. Mark Miller is Medical Director for [facility] and [facility]. 

## 2019-08-07 ENCOUNTER — Other Ambulatory Visit: Payer: Self-pay

## 2019-08-07 ENCOUNTER — Encounter: Payer: BC Managed Care – PPO | Admitting: *Deleted

## 2019-08-07 DIAGNOSIS — Z955 Presence of coronary angioplasty implant and graft: Secondary | ICD-10-CM | POA: Diagnosis not present

## 2019-08-07 NOTE — Progress Notes (Signed)
Daily Session Note  Patient Details  Name: Tamara Villegas MRN: [id number] Date of Birth: 1946/07/22 Referring Provider:     Cardiac Rehab from 07/18/2019 in [facility]  Referring Provider  Paraschos      Encounter Date: 08/07/2019  Check In: Session Check In - 08/07/19 1702      Check-In   Supervising physician immediately available to respond to emergencies  See telemetry face sheet for immediately available ER MD    Location  [facility]-Cardiac & Pulmonary Rehab    Staff Present  Renita Papa, RN Vickki Hearing, BA, ACSM CEP, Exercise Physiologist;Melissa Caiola RDN, LDN    Virtual Visit  No    Medication changes reported      No    Fall or balance concerns reported     No    Warm-up and Cool-down  Performed on first and last piece of equipment    Resistance Training Performed  Yes    VAD Patient?  No    PAD/SET Patient?  No      Pain Assessment   Currently in Pain?  No/denies          Social History   Tobacco Use  Smoking Status Former Smoker  Smokeless Tobacco Never Used  Tobacco Comment   as teenager    Goals Met:  Independence with exercise equipment Exercise tolerated well No report of cardiac concerns or symptoms Strength training completed today  Goals Unmet:  Not Applicable  Comments: Pt able to follow exercise prescription today without complaint.  Will continue to monitor for progression.    Dr. Emily Filbert is Medical Director for [place] and [facility].

## 2019-08-12 ENCOUNTER — Other Ambulatory Visit: Payer: Self-pay

## 2019-08-12 ENCOUNTER — Encounter: Payer: BC Managed Care – PPO | Admitting: *Deleted

## 2019-08-12 DIAGNOSIS — Z955 Presence of coronary angioplasty implant and graft: Secondary | ICD-10-CM

## 2019-08-12 NOTE — Progress Notes (Signed)
Daily Session Note  Patient Details  Name: Tamara Villegas MRN: [id number] Date of Birth: 1946/07/11 Referring Provider:     Cardiac Rehab from 07/18/2019 in [facility] Re[facility]  Referring Provider  Paraschos      Encounter Date: 08/12/2019  Check In: Session Check In - 08/12/19 1657      Check-In   Supervising physician immediately available to respond to emergencies  See telemetry face sheet for immediately available ER MD    Location  [facility]-Cardiac & Pulmonary Rehab    Staff Present  Renita Papa, RN Moises Blood, BS, ACSM CEP, Exercise Physiologist;Joseph Tessie Fass RCP,RRT,BSRT    Virtual Visit  No    Medication changes reported      No    Fall or balance concerns reported     No    Warm-up and Cool-down  Performed on first and last piece of equipment    Resistance Training Performed  Yes    VAD Patient?  No    PAD/SET Patient?  No      Pain Assessment   Currently in Pain?  No/denies          Social History   Tobacco Use  Smoking Status Former Smoker  Smokeless Tobacco Never Used  Tobacco Comment   as teenager    Goals Met:  Independence with exercise equipment Exercise tolerated well No report of cardiac concerns or symptoms Strength training completed today  Goals Unmet:  Not Applicable  Comments: Pt able to follow exercise prescription today without complaint.  Will continue to monitor for progression.    Dr. Emily Filbert is Medical Director for [place] and [facility].

## 2019-08-14 ENCOUNTER — Encounter: Payer: BC Managed Care – PPO | Attending: Cardiology | Admitting: *Deleted

## 2019-08-14 ENCOUNTER — Other Ambulatory Visit: Payer: Self-pay

## 2019-08-14 ENCOUNTER — Encounter: Payer: Self-pay | Admitting: *Deleted

## 2019-08-14 DIAGNOSIS — Z87891 Personal history of nicotine dependence: Secondary | ICD-10-CM | POA: Insufficient documentation

## 2019-08-14 DIAGNOSIS — Z7982 Long term (current) use of aspirin: Secondary | ICD-10-CM | POA: Insufficient documentation

## 2019-08-14 DIAGNOSIS — E7800 Pure hypercholesterolemia, unspecified: Secondary | ICD-10-CM | POA: Insufficient documentation

## 2019-08-14 DIAGNOSIS — G629 Polyneuropathy, unspecified: Secondary | ICD-10-CM | POA: Diagnosis not present

## 2019-08-14 DIAGNOSIS — Z955 Presence of coronary angioplasty implant and graft: Secondary | ICD-10-CM

## 2019-08-14 DIAGNOSIS — Z79899 Other long term (current) drug therapy: Secondary | ICD-10-CM | POA: Diagnosis not present

## 2019-08-14 DIAGNOSIS — I10 Essential (primary) hypertension: Secondary | ICD-10-CM | POA: Insufficient documentation

## 2019-08-14 NOTE — Progress Notes (Signed)
Daily Session Note  Patient Details  Name: Tamara Villegas MRN: [id number] Date of Birth: May 16, 1946 Referring Provider:     Cardiac Rehab from 07/18/2019 in [facility] Cardiac and [facility] Rehab  Referring Provider  Paraschos      Encounter Date: 08/14/2019  Check In: Session Check In - 08/14/19 1651      Check-In   Supervising physician immediately available to respond to emergencies  See telemetry face sheet for immediately available ER MD    Location  [facility]-Cardiac & Pulmonary Rehab    Staff Present  Renita Papa, RN Vickki Hearing, BA, ACSM CEP, Exercise Physiologist;Melissa Caiola RDN, LDN    Virtual Visit  No    Medication changes reported      No    Fall or balance concerns reported     No    Warm-up and Cool-down  Performed on first and last piece of equipment    Resistance Training Performed  Yes    VAD Patient?  No    PAD/SET Patient?  No      Pain Assessment   Currently in Pain?  No/denies          Social History   Tobacco Use  Smoking Status Former Smoker  Smokeless Tobacco Never Used  Tobacco Comment   as teenager    Goals Met:  Independence with exercise equipment Exercise tolerated well No report of cardiac concerns or symptoms Strength training completed today  Goals Unmet:  Not Applicable  Comments: Pt able to follow exercise prescription today without complaint.  Will continue to monitor for progression.    Dr. Emily Filbert is Medical Director for [place] and [facility].

## 2019-08-14 NOTE — Progress Notes (Signed)
Cardiac Individual Treatment Plan  Patient Details  Name: Tamara Villegas MRN: [id number] Date of Birth: [birth year] Referring Provider:     Cardiac Rehab from 07/18/2019 in [facility] Cardiac and Pulmonary Rehab  Referring Provider  Paraschos      Initial Encounter Date:    Cardiac [facility]hab [facility]/18/[facility]  Date  07/18/19      Visit Diagnosis: S/P coronary artery stent placement  Patient's Home Medications on Admission:  Current Outpatient Medications:  .  acetaminophen (TYLENOL) 325 MG tablet, Take 650 mg by mouth every 6 (six) hours as needed for moderate pain or headache., Disp: , Rfl:  .  aspirin 81 MG tablet, Take 81 mg by mouth daily., Disp: , Rfl:  .  Calcium Carb-Cholecalciferol (CALCIUM 600 + D PO), Take 1 tablet by mouth 2 (two) times daily., Disp: , Rfl:  .  DULoxetine (CYMBALTA) 30 MG capsule, Take 30 mg by mouth daily., Disp: , Rfl:  .  latanoprost (XALATAN) 0.005 % ophthalmic solution, Place 1 drop into both eyes at bedtime., Disp: , Rfl:  .  lisinopril (ZESTRIL) 20 MG tablet, Take 20 mg by mouth daily., Disp: , Rfl:  .  lisinopril-hydrochlorothiazide (ZESTORETIC) 20-12.5 MG tablet, Take 1 tablet by mouth daily., Disp: , Rfl:  .  Multiple Vitamin (MULTIVITAMIN) tablet, Take 1 tablet by mouth daily., Disp: , Rfl:  .  Omega-3 Fatty Acids (FISH OIL) 1000 MG CAPS, Take 1,000-2,000 mg by mouth See admin instructions. Take 1000 mg in the morning and 2000 mg at night, Disp: , Rfl:  .  simvastatin (ZOCOR) 40 MG tablet, Take 40 mg by mouth at bedtime., Disp: , Rfl:  .  ticagrelor (BRILINTA) 90 MG TABS tablet, Take 1 tablet (90 mg total) by mouth 2 (two) times daily., Disp: 60 tablet, Rfl: 11 .  timolol (BETIMOL) 0.5 % ophthalmic solution, Place 1 drop into both eyes daily., Disp: , Rfl:   Past Medical History: Past Medical History:  Diagnosis Date  . Hepatitis 1993   B.   . Hypercholesteremia   . Hypertension   . Neuropathy    feet  . Wears  dentures    full upper, partial lower    Tobacco Use: Social History   Tobacco Use  Smoking Status Former Smoker  Smokeless Tobacco Never Used  Tobacco Comment   as teenager    Labs: Recent Review Flowsheet Data    There is no flowsheet data to display.       Exercise Target Goals: Exercise Program Goal: Individual exercise prescription set using results from initial 6 min walk test and THRR while considering  patient's activity barriers and safety.   Exercise Prescription Goal: Initial exercise prescription builds to 30-45 minutes a day of aerobic activity, 2-3 days per week.  Home exercise guidelines will be given to patient during program as part of exercise prescription that the participant will acknowledge.  Activity Barriers & Risk Stratification: Activity Barriers & Cardiac Risk Stratification - 07/10/19 1119      Activity Barriers & Cardiac Risk Stratification   Activity Barriers  Deconditioning;Other (comment);Balance Concerns;Muscular Weakness;History of Falls    Comments  nueropathy in both feet, twisted left ankle in August (Still bothering her), dizzy spells recently    Cardiac Risk Stratification  Moderate       6 Minute Walk: 6 Minute Walk    Row Name 07/18/19 1428         6 Minute Walk   Phase  Initial     Distance  1200 feet     Walk Time  6 minutes        Oxygen Initial Assessment:   Oxygen Re-Evaluation:   Oxygen Discharge (Final Oxygen Re-Evaluation):   Initial Exercise Prescription: Initial Exercise Prescription - 07/18/19 1400      Date of Initial Exercise RX and Referring Provider   Date  07/18/19    Referring Provider  Paraschos      Treadmill   MPH  2    Grade  0.5    Minutes  15    METs  2.6      Recumbant Bike   Level  2    RPM  60    Watts  18    Minutes  15    METs  2.6      NuStep   Level  2    SPM  80    Minutes  15    METs  2.6      REL-XR   Level  2    Speed  50    Minutes  15    METs  2.6       Biostep-RELP   Level  2    SPM  50    Minutes  15    METs  2.6      Prescription Details   Frequency (times per week)  3    Duration  Progress to 30 minutes of continuous aerobic without signs/symptoms of physical distress      Intensity   THRR 40-80% of Max Heartrate  98-130    Ratings of Perceived Exertion  11-15      Resistance Training   Training Prescription  Yes    Weight  4 lb    Reps  10-15       Perform Capillary Blood Glucose checks as needed.  Exercise Prescription Changes: Exercise Prescription Changes    Row Name 08/06/19 0800             Response to Exercise   Blood Pressure (Admit)  138/72       Blood Pressure (Exercise)  130/80       Blood Pressure (Exit)  130/68       Heart Rate (Admit)  84 bpm       Heart Rate (Exercise)  130 bpm       Heart Rate (Exit)  90 bpm       Symptoms  none       Comments  first day        Duration  Continue with 30 min of aerobic exercise without signs/symptoms of physical distress.       Intensity  THRR unchanged         Progression   Progression  Continue to progress workloads to maintain intensity without signs/symptoms of physical distress.       Average METs  2.5         Resistance Training   Training Prescription  Yes       Weight  4 lb       Reps  10-15         Interval Training   Interval Training  No         Treadmill   MPH  2       Grade  0.5       Minutes  15       METs  2.6  NuStep   Level  2       SPM  80       Minutes  15       METs  2.4          Exercise Comments:   Exercise Goals and Review: Exercise Goals    Row Name 07/18/19 1443             Exercise Goals   Increase Physical Activity  Yes       Intervention  Provide advice, education, support and counseling about physical activity/exercise needs.;Develop an individualized exercise prescription for aerobic and resistive training based on initial evaluation findings, risk stratification, comorbidities and  participant's personal goals.       Expected Outcomes  Short Term: Attend rehab on a regular basis to increase amount of physical activity.;Long Term: Add in home exercise to make exercise part of routine and to increase amount of physical activity.;Long Term: Exercising regularly at least 3-5 days a week.       Increase Strength and Stamina  Yes       Intervention  Provide advice, education, support and counseling about physical activity/exercise needs.;Develop an individualized exercise prescription for aerobic and resistive training based on initial evaluation findings, risk stratification, comorbidities and participant's personal goals.       Expected Outcomes  Short Term: Increase workloads from initial exercise prescription for resistance, speed, and METs.;Short Term: Perform resistance training exercises routinely during rehab and add in resistance training at home;Long Term: Improve cardiorespiratory fitness, muscular endurance and strength as measured by increased METs and functional capacity (6MWT)       Able to understand and use rate of perceived exertion (RPE) scale  Yes       Intervention  Provide education and explanation on how to use RPE scale       Expected Outcomes  Short Term: Able to use RPE daily in rehab to express subjective intensity level;Long Term:  Able to use RPE to guide intensity level when exercising independently       Knowledge and understanding of Target Heart Rate Range (THRR)  Yes       Intervention  Provide education and explanation of THRR including how the numbers were predicted and where they are located for reference       Expected Outcomes  Short Term: Able to state/look up THRR;Short Term: Able to use daily as guideline for intensity in rehab;Long Term: Able to use THRR to govern intensity when exercising independently       Able to check pulse independently  Yes       Intervention  Provide education and demonstration on how to check pulse in carotid and radial  arteries.;Review the importance of being able to check your own pulse for safety during independent exercise       Expected Outcomes  Short Term: Able to explain why pulse checking is important during independent exercise;Long Term: Able to check pulse independently and accurately       Understanding of Exercise Prescription  Yes       Intervention  Provide education, explanation, and written materials on patient's individual exercise prescription       Expected Outcomes  Short Term: Able to explain program exercise prescription;Long Term: Able to explain home exercise prescription to exercise independently          Exercise Goals Re-Evaluation : Exercise Goals Re-Evaluation    Row Name 08/05/19 [phone]  Exercise Goal Re-Evaluation   Exercise Goals Review  Increase Physical Activity;Able to understand and use rate of perceived exertion (RPE) scale;Knowledge and understanding of Target Heart Rate Range (THRR);Understanding of Exercise Prescription;Increase Strength and Stamina;Able to check pulse independently       Comments  Reviewed RPE scale, THR and program prescription with pt today.  Pt voiced understanding and was given a copy of goals to take home.       Expected Outcomes  Short: Use RPE daily to regulate intensity. Long: Follow program prescription in THR.          Discharge Exercise Prescription (Final Exercise Prescription Changes): Exercise Prescription Changes - 08/06/19 0800      Response to Exercise   Blood Pressure (Admit)  138/72    Blood Pressure (Exercise)  130/80    Blood Pressure (Exit)  130/68    Heart Rate (Admit)  84 bpm    Heart Rate (Exercise)  130 bpm    Heart Rate (Exit)  90 bpm    Symptoms  none    Comments  first day     Duration  Continue with 30 min of aerobic exercise without signs/symptoms of physical distress.    Intensity  THRR unchanged      Progression   Progression  Continue to progress workloads to maintain intensity without  signs/symptoms of physical distress.    Average METs  2.5      Resistance Training   Training Prescription  Yes    Weight  4 lb    Reps  10-15      Interval Training   Interval Training  No      Treadmill   MPH  2    Grade  0.5    Minutes  15    METs  2.6      NuStep   Level  2    SPM  80    Minutes  15    METs  2.4       Nutrition:  Target Goals: Understanding of nutrition guidelines, daily intake of sodium '1500mg'$ , cholesterol '200mg'$ , calories 30% from fat and 7% or less from saturated fats, daily to have 5 or more servings of fruits and vegetables.  Biometrics:    Nutrition Therapy Plan and Nutrition Goals: Nutrition Therapy & Goals - 07/25/19 1100      Nutrition Therapy   Diet  low Na, HH    Protein (specify units)  65g    Fiber  25 grams    Whole Grain Foods  3 servings    Saturated Fats  12 max. grams    Fruits and Vegetables  5 servings/day    Sodium  1.5 grams      Personal Nutrition Goals   Nutrition Goal  ST: switch to whole grain bread LT: get in better shape    Comments  B: V8 can  grits and water L: sandwich w/ water or tea D: cheese and crackers or popcorn. weekend: [place] or [place], roast or potatoes. Office: sausage biscuit at [employer] w/ water L: sometimes no lunch bring egg salad sandwich, bolonga and mayo sandwich, or out chick-fil-a. 3x 20 oz water/day. banana, apples, grapes, sweet potatoes, broccoli, salads, pinto or baked beans. Pt reports liking tuna fish in water. Discussed HH eating.      Intervention Plan   Intervention  Prescribe, educate and counsel regarding individualized specific dietary modifications aiming towards targeted core components such as weight, hypertension, lipid management, diabetes, heart failure and  other comorbidities.;Nutrition handout(s) given to patient.    Expected Outcomes  Short Term Goal: Understand basic principles of dietary content, such as calories, fat, sodium, cholesterol and nutrients.;Short Term Goal:  A plan has been developed with personal nutrition goals set during dietitian appointment.;Long Term Goal: Adherence to prescribed nutrition plan.       Nutrition Assessments: Nutrition Assessments - 07/18/19 1418      MEDFICTS Scores   Pre Score  42       Nutrition Goals Re-Evaluation:   Nutrition Goals Discharge (Final Nutrition Goals Re-Evaluation):   Psychosocial: Target Goals: Acknowledge presence or absence of significant depression and/or stress, maximize coping skills, provide positive support system. Participant is able to verbalize types and ability to use techniques and skills needed for reducing stress and depression.   Initial Review & Psychosocial Screening: Initial Psych Review & Screening - 07/10/19 1112      Initial Review   Current issues with  Current Stress Concerns    Source of Stress Concerns  Chronic Illness;Unable to participate in former interests or hobbies;Unable to perform yard/household activities    Comments  Current stressor is health with having dizzy spells and risk of passing out.  She  has been lucky enought that they have not happened at work.  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      Family Dynamics   Good Support System?  Yes   Husband and daughter and granddaughter lives nearby     Barriers   Psychosocial barriers to participate in program  The patient should benefit from training in stress management and relaxation.;Psychosocial barriers identified (see note)      Screening Interventions   Interventions  Provide feedback about the scores to participant;To provide support and resources with identified psychosocial needs;Encouraged to exercise    Expected Outcomes  Short Term goal: Utilizing psychosocial counselor, staff and physician to assist with identification of specific Stressors or current issues interfering with healing process. Setting desired goal for each stressor or current issue identified.;Long Term  Goal: Stressors or current issues are controlled or eliminated.;Short Term goal: Identification and review with participant of any Quality of Life or Depression concerns found by scoring the questionnaire.;Long Term goal: The participant improves quality of Life and PHQ9 Scores as seen by post scores and/or verbalization of changes       Quality of Life Scores:  Quality of Life - 07/18/19 1437      Quality of Life   Select  Quality of Life      Quality of Life Scores   Health/Function Pre  21.93 %    Socioeconomic Pre  28.75 %    Psych/Spiritual Pre  29.14 %    Family Pre  28.8 %    GLOBAL Pre  25.91 %      Scores of 19 and below usually indicate a poorer quality of life in these areas.  A difference of  2-3 points is a clinically meaningful difference.  A difference of 2-3 points in the total score of the Quality of Life Index has been associated with significant improvement in overall quality of life, self-image, physical symptoms, and general health in studies assessing change in quality of life.  PHQ-9: Recent Review Flowsheet Data    Depression screen [facility] 2/9 07/18/2019   Decreased Interest 0   Down, Depressed, Hopeless 0   PHQ - 2 Score 0   Altered sleeping 0   Tired, decreased energy 3  Change in appetite 0   Feeling bad or failure about yourself  0   Trouble concentrating 0   Moving slowly or fidgety/restless 0   Suicidal thoughts 0   PHQ-9 Score 3   Difficult doing work/chores Not difficult at all     Interpretation of Total Score  Total Score Depression Severity:  1-4 = Minimal depression, 5-9 = Mild depression, 10-14 = Moderate depression, 15-19 = Moderately severe depression, 20-27 = Severe depression   Psychosocial Evaluation and Intervention: Psychosocial Evaluation - 07/10/19 1140      Psychosocial Evaluation & Interventions   Interventions  Stress management education;Encouraged to exercise with the program and follow exercise prescription    Comments  Tamara Villegas  is doing well.  She has a strong support system. Her biggest stressors are her current health state as they try to solve her dizzy spells.  She is looking forward to exercise.  She also has a history of neuropathy in both feet that only limits her occassionally.  She has a granddaughter that lives near by but hasn't seen since March due to [place].  She also has a leaky valve that they are continuing to monitor.  She will need the 5pm time slot due to work.    Expected Outcomes  Short: Attend rehab regularly around work schedule.  Long: Continue to cope positively.    Continue Psychosocial Services   Follow up required by staff       Psychosocial Re-Evaluation:   Psychosocial Discharge (Final Psychosocial Re-Evaluation):   Vocational Rehabilitation: Provide vocational rehab assistance to qualifying candidates.   Vocational Rehab Evaluation & Intervention: Vocational Rehab - 07/10/19 1118      Initial Vocational Rehab Evaluation & Intervention   Assessment shows need for Vocational Rehabilitation  No       Education: Education Goals: Education classes will be provided on a variety of topics geared toward better understanding of heart health and risk factor modification. Participant will state understanding/return demonstration of topics presented as noted by education test scores.  Learning Barriers/Preferences: Learning Barriers/Preferences - 07/10/19 1117      Learning Barriers/Preferences   Learning Barriers  Sight   glasses   Learning Preferences  Written Material;Verbal Instruction;Skilled Demonstration       Education Topics:  AED/CPR: - Group verbal and written instruction with the use of models to demonstrate the basic use of the AED with the basic ABC's of resuscitation.   General Nutrition Guidelines/Fats and Fiber: -Group instruction provided by verbal, written material, models and posters to present the general guidelines for heart healthy nutrition. Gives an  explanation and review of dietary fats and fiber.   Controlling Sodium/Reading Food Labels: -Group verbal and written material supporting the discussion of sodium use in heart healthy nutrition. Review and explanation with models, verbal and written materials for utilization of the food label.   Exercise Physiology & General Exercise Guidelines: - Group verbal and written instruction with models to review the exercise physiology of the cardiovascular system and associated critical values. Provides general exercise guidelines with specific guidelines to those with heart or lung disease.    Aerobic Exercise & Resistance Training: - Gives group verbal and written instruction on the various components of exercise. Focuses on aerobic and resistive training programs and the benefits of this training and how to safely progress through these programs..   Flexibility, Balance, Mind/Body Relaxation: Provides group verbal/written instruction on the benefits of flexibility and balance training, including mind/body exercise modes such as yoga, pilates  and tai chi.  Demonstration and skill practice provided.   Stress and Anxiety: - Provides group verbal and written instruction about the health risks of elevated stress and causes of high stress.  Discuss the correlation between heart/lung disease and anxiety and treatment options. Review healthy ways to manage with stress and anxiety.   Depression: - Provides group verbal and written instruction on the correlation between heart/lung disease and depressed mood, treatment options, and the stigmas associated with seeking treatment.   Anatomy & Physiology of the Heart: - Group verbal and written instruction and models provide basic cardiac anatomy and physiology, with the coronary electrical and arterial systems. Review of Valvular disease and Heart Failure   Cardiac Procedures: - Group verbal and written instruction to review commonly prescribed  medications for heart disease. Reviews the medication, class of the drug, and side effects. Includes the steps to properly store meds and maintain the prescription regimen. (beta blockers and nitrates)   Cardiac Medications I: - Group verbal and written instruction to review commonly prescribed medications for heart disease. Reviews the medication, class of the drug, and side effects. Includes the steps to properly store meds and maintain the prescription regimen.   Cardiac Medications II: -Group verbal and written instruction to review commonly prescribed medications for heart disease. Reviews the medication, class of the drug, and side effects. (all other drug classes)    Go Sex-Intimacy & Heart Disease, Get SMART - Goal Setting: - Group verbal and written instruction through game format to discuss heart disease and the return to sexual intimacy. Provides group verbal and written material to discuss and apply goal setting through the application of the S.M.A.R.T. Method.   Other Matters of the Heart: - Provides group verbal, written materials and models to describe Stable Angina and Peripheral Artery. Includes description of the disease process and treatment options available to the cardiac patient.   Exercise & Equipment Safety: - Individual verbal instruction and demonstration of equipment use and safety with use of the equipment.   Cardiac Rehab from 07/18/2019 in [facility] Cardiac and Pulmonary Rehab  Date  07/18/19  Educator  AS  Instruction Review Code  1- Verbalizes Understanding      Infection Prevention: - Provides verbal and written material to individual with discussion of infection control including proper hand washing and proper equipment cleaning during exercise session.   Cardiac Rehab from 07/18/2019 in [facility] Cardiac and Pulmonary Rehab  Date  07/18/19  Educator  AS  Instruction Review Code  1- Verbalizes Understanding      Falls Prevention: - Provides verbal and  written material to individual with discussion of falls prevention and safety.   Cardiac Rehab from 07/18/2019 in [facility] Cardiac and Pulmonary Rehab  Date  07/18/19  Educator  AS  Instruction Review Code  1- Verbalizes Understanding      Diabetes: - Individual verbal and written instruction to review signs/symptoms of diabetes, desired ranges of glucose level fasting, after meals and with exercise. Acknowledge that pre and post exercise glucose checks will be done for 3 sessions at entry of program.   Know Your Numbers and Risk Factors: -Group verbal and written instruction about important numbers in your health.  Discussion of what are risk factors and how they play a role in the disease process.  Review of Cholesterol, Blood Pressure, Diabetes, and BMI and the role they play in your overall health.   Sleep Hygiene: -Provides group verbal and written instruction about how sleep can affect your health.  Define sleep hygiene, discuss sleep cycles and impact of sleep habits. Review good sleep hygiene tips.    Other: -Provides group and verbal instruction on various topics (see comments)   Knowledge Questionnaire Score: Knowledge Questionnaire Score - 07/18/19 1438      Knowledge Questionnaire Score   Pre Score  22/26 HF exercise nutrition       Core Components/Risk Factors/Patient Goals at Admission: Personal Goals and Risk Factors at Admission - 07/18/19 1442      Core Components/Risk Factors/Patient Goals on Admission    Weight Management  Yes;Weight Loss    Intervention  Weight Management: Develop a combined nutrition and exercise program designed to reach desired caloric intake, while maintaining appropriate intake of nutrient and fiber, sodium and fats, and appropriate energy expenditure required for the weight goal.;Weight Management: Provide education and appropriate resources to help participant work on and attain dietary goals.    Admit Weight  188 lb 6.4 oz (85.5 kg)     Goal Weight: Short Term  180 lb (81.6 kg)    Goal Weight: Long Term  175 lb (79.4 kg)    Expected Outcomes  Short Term: Continue to assess and modify interventions until short term weight is achieved;Long Term: Adherence to nutrition and physical activity/exercise program aimed toward attainment of established weight goal;Weight Loss: Understanding of general recommendations for a balanced deficit meal plan, which promotes 1-2 lb weight loss per week and includes a negative energy balance of [phone] kcal/d;Understanding recommendations for meals to include 15-35% energy as protein, 25-35% energy from fat, 35-60% energy from carbohydrates, less than '200mg'$  of dietary cholesterol, 20-35 gm of total fiber daily;Understanding of distribution of calorie intake throughout the day with the consumption of 4-5 meals/snacks    Intervention  Provide education on lifestyle modifcations including regular physical activity/exercise, weight management, moderate sodium restriction and increased consumption of fresh fruit, vegetables, and low fat dairy, alcohol moderation, and smoking cessation.;Monitor prescription use compliance.    Expected Outcomes  Short Term: Continued assessment and intervention until BP is < 140/57m HG in hypertensive participants. < 130/862mHG in hypertensive participants with diabetes, heart failure or chronic kidney disease.;Long Term: Maintenance of blood pressure at goal levels.       Core Components/Risk Factors/Patient Goals Review:    Core Components/Risk Factors/Patient Goals at Discharge (Final Review):    ITP Comments: ITP Comments    Row Name 07/10/19 1139 07/24/19 1213 07/25/19 1119 08/05/19 1651 08/14/19 0958   ITP Comments  Completed virtutal orientation.  [facility]  EP eval scheduled for Thursday 11/5.  Pt scheduled for holter monitor with follow up on 11/17.  Will check then to see when to start rehab officially.   Completed Initial RD eval  First full day of exercise!  Patient was oriented to gym and equipment including functions, settings, policies, and procedures.  Patient's individual exercise prescription and treatment plan were reviewed.  All starting workloads were established based on the results of the 6 minute walk test done at initial orientation visit.  The plan for exercise progression was also introduced and progression will be customized based on patient's performance and goals.  30 day review competed . ITP sent to Dr MaEmily Filbertor review, changes as needed and ITP approval signature.  New to program      Comments:

## 2019-08-15 ENCOUNTER — Encounter: Payer: BC Managed Care – PPO | Admitting: *Deleted

## 2019-08-15 DIAGNOSIS — Z955 Presence of coronary angioplasty implant and graft: Secondary | ICD-10-CM

## 2019-08-15 NOTE — Progress Notes (Signed)
Daily Session Note  Patient Details  Name: Tamara Villegas MRN: [id number] Date of Birth: 02-07-1946 Referring Provider:     Cardiac Rehab from 07/18/2019 in [facility] Cardiac and Pulmonary Rehab  Referring Provider  Paraschos      Encounter Date: 08/15/2019  Check In: Session Check In - 08/15/19 1705      Check-In   Supervising physician immediately available to respond to emergencies  See telemetry face sheet for immediately available ER MD    Location  [facility]-Cardiac & Pulmonary Rehab    Staff Present  Renita Papa, RN BSN;Jeanna Durrell BS, Exercise Physiologist;Kelly Amedeo Plenty, BS, ACSM CEP, Exercise Physiologist    Virtual Visit  No    Medication changes reported      No    Fall or balance concerns reported     No    Warm-up and Cool-down  Performed on first and last piece of equipment    Resistance Training Performed  Yes    VAD Patient?  No    PAD/SET Patient?  No      Pain Assessment   Currently in Pain?  No/denies          Social History   Tobacco Use  Smoking Status Former Smoker  Smokeless Tobacco Never Used  Tobacco Comment   as teenager    Goals Met:  Independence with exercise equipment Exercise tolerated well No report of cardiac concerns or symptoms Strength training completed today  Goals Unmet:  Not Applicable  Comments: Pt able to follow exercise prescription today without complaint.  Will continue to monitor for progression.    Dr. Emily Filbert is Medical Director for [place] and [facility].

## 2019-08-19 ENCOUNTER — Other Ambulatory Visit: Payer: Self-pay

## 2019-08-19 ENCOUNTER — Encounter: Payer: BC Managed Care – PPO | Admitting: *Deleted

## 2019-08-19 DIAGNOSIS — Z955 Presence of coronary angioplasty implant and graft: Secondary | ICD-10-CM

## 2019-08-19 NOTE — Progress Notes (Signed)
Daily Session Note  Patient Details  Name: Tamara Villegas MRN: [id number] Date of Birth: 09/04/1946 Referring Provider:     Cardiac Rehab from 07/18/2019 in [facility] Cardiac and Pulmonary Rehab  Referring Provider  Paraschos      Encounter Date: 08/19/2019  Check In: Session Check In - 08/19/19 1654      Check-In   Supervising physician immediately available to respond to emergencies  See telemetry face sheet for immediately available ER MD    Location  [facility]-Cardiac & Pulmonary Rehab    Staff Present  Meredith Craven, RN BSN;Kelly Hayes, BS, ACSM CEP, Exercise Physiologist;Joseph Hood RCP,RRT,BSRT    Virtual Visit  No    Medication changes reported      No    Fall or balance concerns reported     No    Warm-up and Cool-down  Performed on first and last piece of equipment    Resistance Training Performed  Yes    VAD Patient?  No    PAD/SET Patient?  No      Pain Assessment   Currently in Pain?  No/denies          Social History   Tobacco Use  Smoking Status Former Smoker  Smokeless Tobacco Never Used  Tobacco Comment   as teenager    Goals Met:  Independence with exercise equipment Exercise tolerated well No report of cardiac concerns or symptoms Strength training completed today  Goals Unmet:  Not Applicable  Comments: Pt able to follow exercise prescription today without complaint.  Will continue to monitor for progression.    Dr. Mark Miller is Medical Director for [facility] and [facility]. 

## 2019-08-21 ENCOUNTER — Encounter: Payer: BC Managed Care – PPO | Admitting: *Deleted

## 2019-08-21 ENCOUNTER — Other Ambulatory Visit: Payer: Self-pay

## 2019-08-21 DIAGNOSIS — Z955 Presence of coronary angioplasty implant and graft: Secondary | ICD-10-CM

## 2019-08-21 NOTE — Progress Notes (Signed)
Daily Session Note  Patient Details  Name: DEONI COSEY MRN: [id number] Date of Birth: [birth year] Referring Provider:     Cardiac Rehab from 07/18/2019 in [facility] Cardiac and Pulmonary Rehab  Referring Provider  Paraschos      Encounter Date: 08/21/2019  Check In: Session Check In - 08/21/19 1653      Check-In   Supervising physician immediately available to respond to emergencies  See telemetry face sheet for immediately available ER MD    Location  [facility]-Cardiac & Pulmonary Rehab    Staff Present  Renita Papa, RN Vickki Hearing, BA, ACSM CEP, Exercise Physiologist;Melissa Caiola RDN, LDN    Virtual Visit  No    Medication changes reported      No    Fall or balance concerns reported     No    Warm-up and Cool-down  Performed on first and last piece of equipment    Resistance Training Performed  Yes    VAD Patient?  No    PAD/SET Patient?  No      Pain Assessment   Currently in Pain?  No/denies          Social History   Tobacco Use  Smoking Status Former Smoker  Smokeless Tobacco Never Used  Tobacco Comment   as teenager    Goals Met:  Independence with exercise equipment Exercise tolerated well No report of cardiac concerns or symptoms Strength training completed today  Goals Unmet:  Not Applicable  Comments: Pt able to follow exercise prescription today without complaint.  Will continue to monitor for progression. Reviewed home exercise with pt today.  Pt plans to use home equipment for exercise.  Reviewed THR, pulse, RPE, sign and symptoms, NTG use, and when to call 911 or MD.  Also discussed weather considerations and indoor options.  Pt voiced understanding.   Dr. Emily Filbert is Medical Director for [place] and [facility].

## 2019-08-22 ENCOUNTER — Encounter: Payer: BC Managed Care – PPO | Admitting: *Deleted

## 2019-08-22 DIAGNOSIS — Z955 Presence of coronary angioplasty implant and graft: Secondary | ICD-10-CM

## 2019-08-22 NOTE — Progress Notes (Signed)
Daily Session Note  Patient Details  Name: Tamara Villegas MRN: [id number] Date of Birth: 07/26/1946 Referring Provider:     Cardiac Rehab from 07/18/2019 in [facility] Cardiac and Pulmonary Rehab  Referring Provider  Paraschos      Encounter Date: 08/22/2019  Check In: Session Check In - 08/22/19 1652      Check-In   Supervising physician immediately available to respond to emergencies  See telemetry face sheet for immediately available ER MD    Location  [facility]iac & Pul[facility] Re[facility]    Staff Present  Meredith Craven, RN BSN;Kelly Hayes, BS, ACSM CEP, Exercise Physiologist;Joseph Hood RCP,RRT,BSRT    Virtual Visit  No    Medication changes reported      No    Fall or balance concerns reported     No    Warm-up and Cool-down  Performed on first and last piece of equipment    Resistance Training Performed  Yes    VAD Patient?  No    PAD/SET Patient?  No      Pain Assessment   Currently in Pain?  No/denies          Social History   Tobacco Use  Smoking Status Former Smoker  Smokeless Tobacco Never Used  Tobacco Comment   as teenager    Goals Met:  Independence with exercise equipment Exercise tolerated well No report of cardiac concerns or symptoms Strength training completed today  Goals Unmet:  Not Applicable  Comments: Pt able to follow exercise prescription today without complaint.  Will continue to monitor for progression.    Dr. Mark Miller is Medical Director for [facility] and [facility]. 

## 2019-08-26 ENCOUNTER — Other Ambulatory Visit: Payer: Self-pay

## 2019-08-26 ENCOUNTER — Encounter: Payer: BC Managed Care – PPO | Admitting: *Deleted

## 2019-08-26 DIAGNOSIS — Z955 Presence of coronary angioplasty implant and graft: Secondary | ICD-10-CM

## 2019-08-26 NOTE — Progress Notes (Signed)
Daily Session Note  Patient Details  Name: Tamara Villegas MRN: [id number] Date of Birth: January 04, 1946 Referring Provider:     Cardiac Rehab from 07/18/2019 in [facility] Cardiac and Pulmonary Rehab  Referring Provider  Paraschos      Encounter Date: 08/26/2019  Check In: Session Check In - 08/26/19 1705      Check-In   Supervising physician immediately available to respond to emergencies  See telemetry face sheet for immediately available ER MD    Location  [facility]-Cardiac & Pulmonary Rehab    Staff Present  Renita Papa, RN Moises Blood, BS, ACSM CEP, Exercise Physiologist;Joseph Tessie Fass RCP,RRT,BSRT    Virtual Visit  No    Medication changes reported      No    Fall or balance concerns reported     No    Warm-up and Cool-down  Performed on first and last piece of equipment    Resistance Training Performed  Yes    VAD Patient?  No    PAD/SET Patient?  No      Pain Assessment   Currently in Pain?  No/denies          Social History   Tobacco Use  Smoking Status Former Smoker  Smokeless Tobacco Never Used  Tobacco Comment   as teenager    Goals Met:  Independence with exercise equipment Exercise tolerated well No report of cardiac concerns or symptoms Strength training completed today  Goals Unmet:  Not Applicable  Comments: Pt able to follow exercise prescription today without complaint.  Will continue to monitor for progression.    Dr. Emily Filbert is Medical Director for [place] and [facility].

## 2019-08-28 ENCOUNTER — Other Ambulatory Visit: Payer: Self-pay

## 2019-08-28 ENCOUNTER — Encounter: Payer: BC Managed Care – PPO | Admitting: *Deleted

## 2019-08-28 DIAGNOSIS — Z955 Presence of coronary angioplasty implant and graft: Secondary | ICD-10-CM

## 2019-08-28 NOTE — Progress Notes (Signed)
Daily Session Note  Patient Details  Name: Tamara Villegas MRN: [id number] Date of Birth: 1946-02-18 Referring Provider:     Cardiac Rehab from 07/18/2019 in [facility] Cardiac and Pulmonary Rehab  Referring Provider  Paraschos      Encounter Date: 08/28/2019  Check In: Session Check In - 08/28/19 1658      Check-In   Supervising physician immediately available to respond to emergencies  See telemetry face sheet for immediately available ER MD    Location  [facility]-Cardiac & Pulmonary Rehab    Staff Present  Renita Papa, RN BSN;Melissa Caiola RDN, Rowe Pavy, BA, ACSM CEP, Exercise Physiologist    Virtual Visit  No    Medication changes reported      No    Fall or balance concerns reported     No    Warm-up and Cool-down  Performed on first and last piece of equipment    Resistance Training Performed  Yes    VAD Patient?  No    PAD/SET Patient?  No      Pain Assessment   Currently in Pain?  No/denies          Social History   Tobacco Use  Smoking Status Former Smoker  Smokeless Tobacco Never Used  Tobacco Comment   as teenager    Goals Met:  Independence with exercise equipment Exercise tolerated well No report of cardiac concerns or symptoms Strength training completed today  Goals Unmet:  Not Applicable  Comments: Pt able to follow exercise prescription today without complaint.  Will continue to monitor for progression.    Dr. Emily Filbert is Medical Director for [place] and [facility].

## 2019-08-29 ENCOUNTER — Encounter: Payer: BC Managed Care – PPO | Admitting: *Deleted

## 2019-08-29 DIAGNOSIS — Z955 Presence of coronary angioplasty implant and graft: Secondary | ICD-10-CM | POA: Diagnosis not present

## 2019-08-29 NOTE — Progress Notes (Signed)
Daily Session Note  Patient Details  Name: Tamara Villegas MRN: [id number] Date of Birth: 1946/09/02 Referring Provider:     Cardiac Rehab from 07/18/2019 in [facility] Cardiac and Pulmonary Rehab  Referring Provider  Paraschos      Encounter Date: 08/29/2019  Check In: Session Check In - 08/29/19 1702      Check-In   Supervising physician immediately available to respond to emergencies  See telemetry face sheet for immediately available ER MD    Location  [facility]-Cardiac & Pulmonary Rehab    Staff Present  Renita Papa, RN Moises Blood, BS, ACSM CEP, Exercise Physiologist;Joseph Tessie Fass RCP,RRT,BSRT    Virtual Visit  No    Medication changes reported      No    Fall or balance concerns reported     No    Warm-up and Cool-down  Performed on first and last piece of equipment    Resistance Training Performed  Yes    VAD Patient?  No    PAD/SET Patient?  No      Pain Assessment   Currently in Pain?  No/denies          Social History   Tobacco Use  Smoking Status Former Smoker  Smokeless Tobacco Never Used  Tobacco Comment   as teenager    Goals Met:  Independence with exercise equipment Exercise tolerated well No report of cardiac concerns or symptoms  Goals Unmet:  Not Applicable  Comments: Pt able to follow exercise prescription today without complaint.  Will continue to monitor for progression.    Dr. Emily Filbert is Medical Director for [place] and [facility].

## 2019-09-02 ENCOUNTER — Encounter: Payer: BC Managed Care – PPO | Admitting: *Deleted

## 2019-09-02 ENCOUNTER — Other Ambulatory Visit: Payer: Self-pay

## 2019-09-02 DIAGNOSIS — Z955 Presence of coronary angioplasty implant and graft: Secondary | ICD-10-CM

## 2019-09-02 NOTE — Progress Notes (Signed)
Daily Session Note  Patient Details  Name: Tamara Villegas MRN: [id number] Date of Birth: [birth year] Referring Provider:     Cardiac Rehab from 07/18/2019 in [facility] Cardiac and Pulmonary Rehab  Referring Provider  Paraschos      Encounter Date: 09/02/2019  Check In: Session Check In - 09/02/19 [place]      Check-In   Supervising physician immediately available to respond to emergencies  See telemetry face sheet for immediately available ER MD    Location  [facility]-Cardiac & Pulmonary Rehab    Staff Present  Renita Papa, RN Moises Blood, BS, ACSM CEP, Exercise Physiologist;Joseph Tessie Fass RCP,RRT,BSRT    Virtual Visit  No    Medication changes reported      No    Fall or balance concerns reported     No    Warm-up and Cool-down  Performed on first and last piece of equipment    Resistance Training Performed  Yes    VAD Patient?  No    PAD/SET Patient?  No      Pain Assessment   Currently in Pain?  No/denies          Social History   Tobacco Use  Smoking Status Former Smoker  Smokeless Tobacco Never Used  Tobacco Comment   as teenager    Goals Met:  Independence with exercise equipment Exercise tolerated well Personal goals reviewed No report of cardiac concerns or symptoms Strength training completed today  Goals Unmet:  Not Applicable  Comments: Pt able to follow exercise prescription today without complaint.  Will continue to monitor for progression.    Dr. Emily Filbert is Medical Director for [place] and [facility].

## 2019-09-09 ENCOUNTER — Ambulatory Visit: Payer: BC Managed Care – PPO

## 2019-09-11 ENCOUNTER — Encounter: Payer: Self-pay | Admitting: *Deleted

## 2019-09-11 DIAGNOSIS — Z955 Presence of coronary angioplasty implant and graft: Secondary | ICD-10-CM

## 2019-09-11 NOTE — Progress Notes (Signed)
Cardiac Individual Treatment Plan  Patient Details  Name: Tamara Villegas MRN: [id number] Date of Birth: [birth year] Referring Provider:     Cardiac Rehab from 07/18/2019 in [facility] Cardiac and Pulmonary Rehab  Referring Provider  Paraschos      Initial Encounter Date:    Cardiac [facility]/18/[facility]  Date  07/18/19      Visit Diagnosis: S/P coronary artery stent placement  Patient's Home Medications on Admission:  Current Outpatient Medications:  .  acetaminophen (TYLENOL) 325 MG tablet, Take 650 mg by mouth every 6 (six) hours as needed for moderate pain or headache., Disp: , Rfl:  .  aspirin 81 MG tablet, Take 81 mg by mouth daily., Disp: , Rfl:  .  Calcium Carb-Cholecalciferol (CALCIUM 600 + D PO), Take 1 tablet by mouth 2 (two) times daily., Disp: , Rfl:  .  DULoxetine (CYMBALTA) 30 MG capsule, Take 30 mg by mouth daily., Disp: , Rfl:  .  latanoprost (XALATAN) 0.005 % ophthalmic solution, Place 1 drop into both eyes at bedtime., Disp: , Rfl:  .  lisinopril (ZESTRIL) 20 MG tablet, Take 20 mg by mouth daily., Disp: , Rfl:  .  lisinopril-hydrochlorothiazide (ZESTORETIC) 20-12.5 MG tablet, Take 1 tablet by mouth daily., Disp: , Rfl:  .  Multiple Vitamin (MULTIVITAMIN) tablet, Take 1 tablet by mouth daily., Disp: , Rfl:  .  Omega-3 Fatty Acids (FISH OIL) 1000 MG CAPS, Take 1,000-2,000 mg by mouth See admin instructions. Take 1000 mg in the morning and 2000 mg at night, Disp: , Rfl:  .  simvastatin (ZOCOR) 40 MG tablet, Take 40 mg by mouth at bedtime., Disp: , Rfl:  .  ticagrelor (BRILINTA) 90 MG TABS tablet, Take 1 tablet (90 mg total) by mouth 2 (two) times daily., Disp: 60 tablet, Rfl: 11 .  timolol (BETIMOL) 0.5 % ophthalmic solution, Place 1 drop into both eyes daily., Disp: , Rfl:   Past Medical History: Past Medical History:  Diagnosis Date  . Hepatitis 1993   B.   . Hypercholesteremia   . Hypertension   . Neuropathy    feet  . Wears  dentures    full upper, partial lower    Tobacco Use: Social History   Tobacco Use  Smoking Status Former Smoker  Smokeless Tobacco Never Used  Tobacco Comment   as teenager    Labs: Recent Review Flowsheet Data    There is no flowsheet data to display.       Exercise Target Goals: Exercise Program Goal: Individual exercise prescription set using results from initial 6 min walk test and THRR while considering  patient's activity barriers and safety.   Exercise Prescription Goal: Initial exercise prescription builds to 30-45 minutes a day of aerobic activity, 2-3 days per week.  Home exercise guidelines will be given to patient during program as part of exercise prescription that the participant will acknowledge.  Activity Barriers & Risk Stratification: Activity Barriers & Cardiac Risk Stratification - 07/10/19 1119      Activity Barriers & Cardiac Risk Stratification   Activity Barriers  Deconditioning;Other (comment);Balance Concerns;Muscular Weakness;History of Falls    Comments  nueropathy in both feet, twisted left ankle in August (Still bothering her), dizzy spells recently    Cardiac Risk Stratification  Moderate       6 Minute Walk: 6 Minute Walk    Row Name 07/18/19 1428         6 Minute Walk   Phase  Initial     Distance  1200 feet     Walk Time  6 minutes        Oxygen Initial Assessment:   Oxygen Re-Evaluation:   Oxygen Discharge (Final Oxygen Re-Evaluation):   Initial Exercise Prescription: Initial Exercise Prescription - 07/18/19 1400      Date of Initial Exercise RX and Referring Provider   Date  07/18/19    Referring Provider  Paraschos      Treadmill   MPH  2    Grade  0.5    Minutes  15    METs  2.6      Recumbant Bike   Level  2    RPM  60    Watts  18    Minutes  15    METs  2.6      NuStep   Level  2    SPM  80    Minutes  15    METs  2.6      REL-XR   Level  2    Speed  50    Minutes  15    METs  2.6       Biostep-RELP   Level  2    SPM  50    Minutes  15    METs  2.6      Prescription Details   Frequency (times per week)  3    Duration  Progress to 30 minutes of continuous aerobic without signs/symptoms of physical distress      Intensity   THRR 40-80% of Max Heartrate  98-130    Ratings of Perceived Exertion  11-15      Resistance Training   Training Prescription  Yes    Weight  4 lb    Reps  10-15       Perform Capillary Blood Glucose checks as needed.  Exercise Prescription Changes: Exercise Prescription Changes    Row Name 08/06/19 0800 08/21/19 1100 08/21/19 1700 09/03/19 1300       Response to Exercise   Blood Pressure (Admit)  138/72  130/82  -  122/70    Blood Pressure (Exercise)  130/80  162/82  -  128/52    Blood Pressure (Exit)  130/68  118/62  -  122/56    Heart Rate (Admit)  84 bpm  92 bpm  -  85 bpm    Heart Rate (Exercise)  130 bpm  134 bpm  -  123 bpm    Heart Rate (Exit)  90 bpm  111 bpm  -  84 bpm    Rating of Perceived Exertion (Exercise)  -  15  -  15    Symptoms  none  none  -  none    Comments  first day   -  -  -    Duration  Continue with 30 min of aerobic exercise without signs/symptoms of physical distress.  Continue with 30 min of aerobic exercise without signs/symptoms of physical distress.  -  Continue with 30 min of aerobic exercise without signs/symptoms of physical distress.    Intensity  THRR unchanged  THRR unchanged  -  THRR unchanged      Progression   Progression  Continue to progress workloads to maintain intensity without signs/symptoms of physical distress.  Continue to progress workloads to maintain intensity without signs/symptoms of physical distress.  -  Continue to progress workloads to maintain intensity without signs/symptoms of physical distress.    Average  METs  2.5  2.82  -  3.7      Resistance Training   Training Prescription  Yes  Yes  -  Yes    Weight  4 lb  4 lb  -  4 lb    Reps  10-15  10-15  -  10-15       Interval Training   Interval Training  No  No  -  No      Treadmill   MPH  2  2  -  -    Grade  0.5  1.5  -  -    Minutes  15  15  -  -    METs  2.6  2.94  -  -      NuStep   Level  2  2  -  3    SPM  80  -  -  80    Minutes  15  15  -  15    METs  2.4  2.5  -  3.7      Home Exercise Plan   Plans to continue exercise at  -  -  Home (comment) elliptical  Home (comment) elliptical    Frequency  -  -  -  Add 1 additional day to program exercise sessions.    Initial Home Exercises Provided  -  -  -  08/21/19       Exercise Comments: Exercise Comments    Row Name 08/21/19 1711           Exercise Comments  Reviewed home exercise with pt today.  Pt plans to use home equipment for exercise.  Reviewed THR, pulse, RPE, sign and symptoms, NTG use, and when to call 911 or MD.  Also discussed weather considerations and indoor options.  Pt voiced understanding.          Exercise Goals and Review: Exercise Goals    Row Name 07/18/19 1443             Exercise Goals   Increase Physical Activity  Yes       Intervention  Provide advice, education, support and counseling about physical activity/exercise needs.;Develop an individualized exercise prescription for aerobic and resistive training based on initial evaluation findings, risk stratification, comorbidities and participant's personal goals.       Expected Outcomes  Short Term: Attend rehab on a regular basis to increase amount of physical activity.;Long Term: Add in home exercise to make exercise part of routine and to increase amount of physical activity.;Long Term: Exercising regularly at least 3-5 days a week.       Increase Strength and Stamina  Yes       Intervention  Provide advice, education, support and counseling about physical activity/exercise needs.;Develop an individualized exercise prescription for aerobic and resistive training based on initial evaluation findings, risk stratification, comorbidities and participant's  personal goals.       Expected Outcomes  Short Term: Increase workloads from initial exercise prescription for resistance, speed, and METs.;Short Term: Perform resistance training exercises routinely during rehab and add in resistance training at home;Long Term: Improve cardiorespiratory fitness, muscular endurance and strength as measured by increased METs and functional capacity (6MWT)       Able to understand and use rate of perceived exertion (RPE) scale  Yes       Intervention  Provide education and explanation on how to use RPE scale       Expected Outcomes  Short Term: Able to use RPE daily in rehab to express subjective intensity level;Long Term:  Able to use RPE to guide intensity level when exercising independently       Knowledge and understanding of Target Heart Rate Range (THRR)  Yes       Intervention  Provide education and explanation of THRR including how the numbers were predicted and where they are located for reference       Expected Outcomes  Short Term: Able to state/look up THRR;Short Term: Able to use daily as guideline for intensity in rehab;Long Term: Able to use THRR to govern intensity when exercising independently       Able to check pulse independently  Yes       Intervention  Provide education and demonstration on how to check pulse in carotid and radial arteries.;Review the importance of being able to check your own pulse for safety during independent exercise       Expected Outcomes  Short Term: Able to explain why pulse checking is important during independent exercise;Long Term: Able to check pulse independently and accurately       Understanding of Exercise Prescription  Yes       Intervention  Provide education, explanation, and written materials on patient's individual exercise prescription       Expected Outcomes  Short Term: Able to explain program exercise prescription;Long Term: Able to explain home exercise prescription to exercise independently           Exercise Goals Re-Evaluation : Exercise Goals Re-Evaluation    Row Name 08/05/19 1652 08/21/19 1117 09/02/19 1700 09/03/19 1310       Exercise Goal Re-Evaluation   Exercise Goals Review  Increase Physical Activity;Able to understand and use rate of perceived exertion (RPE) scale;Knowledge and understanding of Target Heart Rate Range (THRR);Understanding of Exercise Prescription;Increase Strength and Stamina;Able to check pulse independently  Increase Physical Activity;Increase Strength and Stamina;Understanding of Exercise Prescription  Increase Physical Activity;Increase Strength and Stamina;Understanding of Exercise Prescription;Able to understand and use rate of perceived exertion (RPE) scale  Increase Physical Activity;Increase Strength and Stamina;Able to understand and use rate of perceived exertion (RPE) scale;Knowledge and understanding of Target Heart Rate Range (THRR);Able to check pulse independently;Understanding of Exercise Prescription    Comments  Reviewed RPE scale, THR and program prescription with pt today.  Pt voiced understanding and was given a copy of goals to take home.  Tamara Villegas is doing well in rehab.  She has not had any symptoms since staring.  She is now up to 2.0 mph on the treadmill. We will continue to monitor her progress.  Tamara Villegas has recently increased her NS level to 3 and her TM level to 3.0/3.0%. She reports her SOB, which was her main limiting factor, has improved and she is able to do more at home without limitations. She is able to walk faster and reports less fatigue. Tamara Villegas has an elliptical at home she plans to start using once cardiac rehab classes are cut back to do covid closure. She plans to exercise at home when she can not come into rehab. She was given our you tube channel information and plans to use those exercise and education videos as well.  -    Expected Outcomes  Short: Use RPE daily to regulate intensity. Long: Follow program prescription in THR.  Short:  Review home exercise guidelines.  Long: Continue to build stamina.  Short: Continue to attend cardiac rehab and exercise at home on off days.  Long: maintain an independent exercise routine to help reduce cardiac risk factors.  -       Discharge Exercise Prescription (Final Exercise Prescription Changes): Exercise Prescription Changes - 09/03/19 1300      Response to Exercise   Blood Pressure (Admit)  122/70    Blood Pressure (Exercise)  128/52    Blood Pressure (Exit)  122/56    Heart Rate (Admit)  85 bpm    Heart Rate (Exercise)  123 bpm    Heart Rate (Exit)  84 bpm    Rating of Perceived Exertion (Exercise)  15    Symptoms  none    Duration  Continue with 30 min of aerobic exercise without signs/symptoms of physical distress.    Intensity  THRR unchanged      Progression   Progression  Continue to progress workloads to maintain intensity without signs/symptoms of physical distress.    Average METs  3.7      Resistance Training   Training Prescription  Yes    Weight  4 lb    Reps  10-15      Interval Training   Interval Training  No      NuStep   Level  3    SPM  80    Minutes  15    METs  3.7      Home Exercise Plan   Plans to continue exercise at  Home (comment)   elliptical   Frequency  Add 1 additional day to program exercise sessions.    Initial Home Exercises Provided  08/21/19       Nutrition:  Target Goals: Understanding of nutrition guidelines, daily intake of sodium <1571m, cholesterol <2043m calories 30% from fat and 7% or less from saturated fats, daily to have 5 or more servings of fruits and vegetables.  Biometrics:    Nutrition Therapy Plan and Nutrition Goals: Nutrition Therapy & Goals - 07/25/19 1100      Nutrition Therapy   Diet  low Na, HH    Protein (specify units)  65g    Fiber  25 grams    Whole Grain Foods  3 servings    Saturated Fats  12 max. grams    Fruits and Vegetables  5 servings/day    Sodium  1.5 grams      Personal  Nutrition Goals   Nutrition Goal  ST: switch to whole grain bread LT: get in better shape    Comments  B: V8 can  grits and water L: sandwich w/ water or tea D: cheese and crackers or popcorn. weekend: mePolandr chMongoliaroast or potatoes. Office: sausage biscuit at [employer]/ water L: sometimes no lunch bring egg salad sandwich, bolonga and mayo sandwich, or out chick-fil-a. 3x 20 oz water/day. banana, apples, grapes, sweet potatoes, broccoli, salads, pinto or baked beans. Pt reports liking tuna fish in water. Discussed HH eating.      Intervention Plan   Intervention  Prescribe, educate and counsel regarding individualized specific dietary modifications aiming towards targeted core components such as weight, hypertension, lipid management, diabetes, heart failure and other comorbidities.;Nutrition handout(s) given to patient.    Expected Outcomes  Short Term Goal: Understand basic principles of dietary content, such as calories, fat, sodium, cholesterol and nutrients.;Short Term Goal: A plan has been developed with personal nutrition goals set during dietitian appointment.;Long Term Goal: Adherence to prescribed nutrition plan.       Nutrition Assessments: Nutrition Assessments - 07/18/19 1418  MEDFICTS Scores   Pre Score  42       Nutrition Goals Re-Evaluation: Nutrition Goals Re-Evaluation    Tamara Villegas Name 08/19/19 1653             Goals   Nutrition Goal  ST: review HH eating handout LT: get in better shape       Comment  switched to whole grain bread. Will review handout on HH eating and will make goals next re-eval.       Expected Outcome  ST: review HH eating handout LT: get in better shape          Nutrition Goals Discharge (Final Nutrition Goals Re-Evaluation): Nutrition Goals Re-Evaluation - 08/19/19 1653      Goals   Nutrition Goal  ST: review HH eating handout LT: get in better shape    Comment  switched to whole grain bread. Will review handout on HH eating and  will make goals next re-eval.    Expected Outcome  ST: review HH eating handout LT: get in better shape       Psychosocial: Target Goals: Acknowledge presence or absence of significant depression and/or stress, maximize coping skills, provide positive support system. Participant is able to verbalize types and ability to use techniques and skills needed for reducing stress and depression.   Initial Review & Psychosocial Screening: Initial Psych Review & Screening - 07/10/19 1112      Initial Review   Current issues with  Current Stress Concerns    Source of Stress Concerns  Chronic Illness;Unable to participate in former interests or hobbies;Unable to perform yard/household activities    Comments  Current stressor is health with having dizzy spells and risk of passing out.  She  has been lucky enought that they have not happened at work.  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      Family Dynamics   Good Support System?  Yes   Husband and daughter and granddaughter lives nearby     Barriers   Psychosocial barriers to participate in program  The patient should benefit from training in stress management and relaxation.;Psychosocial barriers identified (see note)      Screening Interventions   Interventions  Provide feedback about the scores to participant;To provide support and resources with identified psychosocial needs;Encouraged to exercise    Expected Outcomes  Short Term goal: Utilizing psychosocial counselor, staff and physician to assist with identification of specific Stressors or current issues interfering with healing process. Setting desired goal for each stressor or current issue identified.;Long Term Goal: Stressors or current issues are controlled or eliminated.;Short Term goal: Identification and review with participant of any Quality of Life or Depression concerns found by scoring the questionnaire.;Long Term goal: The participant improves quality of Life  and PHQ9 Scores as seen by post scores and/or verbalization of changes       Quality of Life Scores:  Quality of Life - 07/18/19 1437      Quality of Life   Select  Quality of Life      Quality of Life Scores   Health/Function Pre  21.93 %    Socioeconomic Pre  28.75 %    Psych/Spiritual Pre  29.14 %    Family Pre  28.8 %    GLOBAL Pre  25.91 %      Scores of 19 and below usually indicate a poorer quality of life in these areas.  A difference of  2-3 points is a  clinically meaningful difference.  A difference of 2-3 points in the total score of the Quality of Life Index has been associated with significant improvement in overall quality of life, self-image, physical symptoms, and general health in studies assessing change in quality of life.  PHQ-9: Recent Review Flowsheet Data    Depression screen [facility] 2/9 07/18/2019   Decreased Interest 0   Down, Depressed, Hopeless 0   PHQ - 2 Score 0   Altered sleeping 0   Tired, decreased energy 3   Change in appetite 0   Feeling bad or failure about yourself  0   Trouble concentrating 0   Moving slowly or fidgety/restless 0   Suicidal thoughts 0   PHQ-9 Score 3   Difficult doing work/chores Not difficult at all     Interpretation of Total Score  Total Score Depression Severity:  1-4 = Minimal depression, 5-9 = Mild depression, 10-14 = Moderate depression, 15-19 = Moderately severe depression, 20-27 = Severe depression   Psychosocial Evaluation and Intervention: Psychosocial Evaluation - 07/10/19 1140      Psychosocial Evaluation & Interventions   Interventions  Stress management education;Encouraged to exercise with the program and follow exercise prescription    Comments  Tamara Villegas is doing well.  She has a strong support system. Her biggest stressors are her current health state as they try to solve her dizzy spells.  She is looking forward to exercise.  She also has a history of neuropathy in both feet that only limits her occassionally.   She has a granddaughter that lives near by but hasn't seen since March due to [place].  She also has a leaky valve that they are continuing to monitor.  She will need the 5pm time slot due to work.    Expected Outcomes  Short: Attend rehab regularly around work schedule.  Long: Continue to cope positively.    Continue Psychosocial Services   Follow up required by staff       Psychosocial Re-Evaluation: Psychosocial Re-Evaluation    [place] Name 09/02/19 1713             Psychosocial Re-Evaluation   Current issues with  Current Stress Concerns       Comments  Tamara Villegas reports no new stress concerns. She still deals with occational dizzy spells but this has improved since her doctor has adjusted her blood pressure medication.       Expected Outcomes  Short: Attend [facility] stress management education to decrease stress. Long: Maintain exercise Post [facility] to keep stress at a minimum.       Interventions  Encouraged to attend Cardiac Rehabilitation for the exercise       Continue Psychosocial Services   Follow up required by staff          Psychosocial Discharge (Final Psychosocial Re-Evaluation): Psychosocial Re-Evaluation - 09/02/19 1713      Psychosocial Re-Evaluation   Current issues with  Current Stress Concerns    Comments  Tamara Villegas reports no new stress concerns. She still deals with occational dizzy spells but this has improved since her doctor has adjusted her blood pressure medication.    Expected Outcomes  Short: Attend [facility] stress management education to decrease stress. Long: Maintain exercise Post [facility] to keep stress at a minimum.    Interventions  Encouraged to attend Cardiac Rehabilitation for the exercise    Continue Psychosocial Services   Follow up required by staff       Vocational Rehabilitation: Provide vocational rehab assistance to  qualifying candidates.   Vocational Rehab Evaluation & Intervention: Vocational Rehab - 07/10/19 1118      Initial Vocational  Rehab Evaluation & Intervention   Assessment shows need for Vocational Rehabilitation  No       Education: Education Goals: Education classes will be provided on a variety of topics geared toward better understanding of heart health and risk factor modification. Participant will state understanding/return demonstration of topics presented as noted by education test scores.  Learning Barriers/Preferences: Learning Barriers/Preferences - 07/10/19 1117      Learning Barriers/Preferences   Learning Barriers  Sight   glasses   Learning Preferences  Written Material;Verbal Instruction;Skilled Demonstration       Education Topics:  AED/CPR: - Group verbal and written instruction with the use of models to demonstrate the basic use of the AED with the basic ABC's of resuscitation.   General Nutrition Guidelines/Fats and Fiber: -Group instruction provided by verbal, written material, models and posters to present the general guidelines for heart healthy nutrition. Gives an explanation and review of dietary fats and fiber.   Controlling Sodium/Reading Food Labels: -Group verbal and written material supporting the discussion of sodium use in heart healthy nutrition. Review and explanation with models, verbal and written materials for utilization of the food label.   Exercise Physiology & General Exercise Guidelines: - Group verbal and written instruction with models to review the exercise physiology of the cardiovascular system and associated critical values. Provides general exercise guidelines with specific guidelines to those with heart or lung disease.    Aerobic Exercise & Resistance Training: - Gives group verbal and written instruction on the various components of exercise. Focuses on aerobic and resistive training programs and the benefits of this training and how to safely progress through these programs..   Flexibility, Balance, Mind/Body Relaxation: Provides group  verbal/written instruction on the benefits of flexibility and balance training, including mind/body exercise modes such as yoga, pilates and tai chi.  Demonstration and skill practice provided.   Cardiac Rehab from 08/15/2019 in [facility] Cardiac and Pulmonary Rehab  Date  08/15/19  Educator  AS  Instruction Review Code  1- Verbalizes Understanding      Stress and Anxiety: - Provides group verbal and written instruction about the health risks of elevated stress and causes of high stress.  Discuss the correlation between heart/lung disease and anxiety and treatment options. Review healthy ways to manage with stress and anxiety.   Depression: - Provides group verbal and written instruction on the correlation between heart/lung disease and depressed mood, treatment options, and the stigmas associated with seeking treatment.   Anatomy & Physiology of the Heart: - Group verbal and written instruction and models provide basic cardiac anatomy and physiology, with the coronary electrical and arterial systems. Review of Valvular disease and Heart Failure   Cardiac Procedures: - Group verbal and written instruction to review commonly prescribed medications for heart disease. Reviews the medication, class of the drug, and side effects. Includes the steps to properly store meds and maintain the prescription regimen. (beta blockers and nitrates)   Cardiac Medications I: - Group verbal and written instruction to review commonly prescribed medications for heart disease. Reviews the medication, class of the drug, and side effects. Includes the steps to properly store meds and maintain the prescription regimen.   Cardiac Medications II: -Group verbal and written instruction to review commonly prescribed medications for heart disease. Reviews the medication, class of the drug, and side effects. (all other drug classes)  Go Sex-Intimacy & Heart Disease, Get SMART - Goal Setting: - Group verbal and written  instruction through game format to discuss heart disease and the return to sexual intimacy. Provides group verbal and written material to discuss and apply goal setting through the application of the S.M.A.R.T. Method.   Other Matters of the Heart: - Provides group verbal, written materials and models to describe Stable Angina and Peripheral Artery. Includes description of the disease process and treatment options available to the cardiac patient.   Exercise & Equipment Safety: - Individual verbal instruction and demonstration of equipment use and safety with use of the equipment.   Cardiac Rehab from 08/15/2019 in [facility] Cardiac and Pulmonary Rehab  Date  07/18/19  Educator  AS  Instruction Review Code  1- Verbalizes Understanding      Infection Prevention: - Provides verbal and written material to individual with discussion of infection control including proper hand washing and proper equipment cleaning during exercise session.   Cardiac Rehab from 08/15/2019 in [facility] Cardiac and Pulmonary Rehab  Date  07/18/19  Educator  AS  Instruction Review Code  1- Verbalizes Understanding      Falls Prevention: - Provides verbal and written material to individual with discussion of falls prevention and safety.   Cardiac Rehab from 08/15/2019 in [facility] Cardiac and Pulmonary Rehab  Date  07/18/19  Educator  AS  Instruction Review Code  1- Verbalizes Understanding      Diabetes: - Individual verbal and written instruction to review signs/symptoms of diabetes, desired ranges of glucose level fasting, after meals and with exercise. Acknowledge that pre and post exercise glucose checks will be done for 3 sessions at entry of program.   Know Your Numbers and Risk Factors: -Group verbal and written instruction about important numbers in your health.  Discussion of what are risk factors and how they play a role in the disease process.  Review of Cholesterol, Blood Pressure, Diabetes, and BMI and the  role they play in your overall health.   Sleep Hygiene: -Provides group verbal and written instruction about how sleep can affect your health.  Define sleep hygiene, discuss sleep cycles and impact of sleep habits. Review good sleep hygiene tips.    Other: -Provides group and verbal instruction on various topics (see comments)   Knowledge Questionnaire Score: Knowledge Questionnaire Score - 07/18/19 1438      Knowledge Questionnaire Score   Pre Score  22/26 HF exercise nutrition       Core Components/Risk Factors/Patient Goals at Admission: Personal Goals and Risk Factors at Admission - 07/18/19 1442      Core Components/Risk Factors/Patient Goals on Admission    Weight Management  Yes;Weight Loss    Intervention  Weight Management: Develop a combined nutrition and exercise program designed to reach desired caloric intake, while maintaining appropriate intake of nutrient and fiber, sodium and fats, and appropriate energy expenditure required for the weight goal.;Weight Management: Provide education and appropriate resources to help participant work on and attain dietary goals.    Admit Weight  188 lb 6.4 oz (85.5 kg)    Goal Weight: Short Term  180 lb (81.6 kg)    Goal Weight: Long Term  175 lb (79.4 kg)    Expected Outcomes  Short Term: Continue to assess and modify interventions until short term weight is achieved;Long Term: Adherence to nutrition and physical activity/exercise program aimed toward attainment of established weight goal;Weight Loss: Understanding of general recommendations for a balanced deficit meal plan, which  promotes 1-2 lb weight loss per week and includes a negative energy balance of [phone] kcal/d;Understanding recommendations for meals to include 15-35% energy as protein, 25-35% energy from fat, 35-60% energy from carbohydrates, less than 26m of dietary cholesterol, 20-35 gm of total fiber daily;Understanding of distribution of calorie intake throughout the  day with the consumption of 4-5 meals/snacks    Intervention  Provide education on lifestyle modifcations including regular physical activity/exercise, weight management, moderate sodium restriction and increased consumption of fresh fruit, vegetables, and low fat dairy, alcohol moderation, and smoking cessation.;Monitor prescription use compliance.    Expected Outcomes  Short Term: Continued assessment and intervention until BP is < 140/962mHG in hypertensive participants. < 130/808mG in hypertensive participants with diabetes, heart failure or chronic kidney disease.;Long Term: Maintenance of blood pressure at goal levels.       Core Components/Risk Factors/Patient Goals Review:  Goals and Risk Factor Review    Row Name 09/02/19 1717             Core Components/Risk Factors/Patient Goals Review   Personal Goals Review  Weight Management/Obesity;Hypertension;Lipids       Review  Tamara Villegas lost 8 lbs since starting cardiac rehab, changing diet, and exercising. She continues to take all meds as prescribed to contol blood pressure and lipids. Her BP meds were recently adjusted, which has helped improve lightheadedness. BP and lipids are reported to be in acceptable ranges.       Expected Outcomes  Short: continue weight loss, short term weight goal 175 lbs. Long: Continue to maintain heart healthy lifestyle to control risk factors. Longer term weight goal 170 lbs.          Core Components/Risk Factors/Patient Goals at Discharge (Final Review):  Goals and Risk Factor Review - 09/02/19 1717      Core Components/Risk Factors/Patient Goals Review   Personal Goals Review  Weight Management/Obesity;Hypertension;Lipids    Review  Tamara Villegas lost 8 lbs since starting cardiac rehab, changing diet, and exercising. She continues to take all meds as prescribed to contol blood pressure and lipids. Her BP meds were recently adjusted, which has helped improve lightheadedness. BP and lipids are reported to be in  acceptable ranges.    Expected Outcomes  Short: continue weight loss, short term weight goal 175 lbs. Long: Continue to maintain heart healthy lifestyle to control risk factors. Longer term weight goal 170 lbs.       ITP Comments: ITP Comments    Row Name 07/10/19 1139 07/24/19 1213 07/25/19 1119 08/05/19 1651 08/14/19 0958   ITP Comments  Completed virtutal orientation.  [facility]  EP eval scheduled for Thursday 11/5.  Pt scheduled for holter monitor with follow up on 11/17.  Will check then to see when to start rehab officially.  Completed Initial RD eval  First full day of exercise!  Patient was oriented to gym and equipment including functions, settings, policies, and procedures.  Patient's individual exercise prescription and treatment plan were reviewed.  All starting workloads were established based on the results of the 6 minute walk test done at initial orientation visit.  The plan for exercise progression was also introduced and progression will be customized based on patient's performance and goals.  30 day review competed . ITP sent to Dr MarEmily Filbertr review, changes as needed and ITP approval signature.  New to program   Row Name 08/21/19 1711 09/11/19 [id number]  ITP Comments  Reviewed home exercise with pt today.  Pt plans to use home equipment for exercise.  Reviewed THR, pulse, RPE, sign and symptoms, NTG use, and when to call 911 or MD.  Also discussed weather considerations and indoor options.  Pt voiced understanding.  30 day review competed . ITP sent to Dr Emily Filbert for review, changes as needed and ITP approval signature New to program         Comments:

## 2019-09-16 ENCOUNTER — Encounter: Payer: BC Managed Care – PPO | Attending: Cardiology | Admitting: *Deleted

## 2019-09-16 ENCOUNTER — Other Ambulatory Visit: Payer: Self-pay

## 2019-09-16 DIAGNOSIS — Z7982 Long term (current) use of aspirin: Secondary | ICD-10-CM | POA: Insufficient documentation

## 2019-09-16 DIAGNOSIS — I10 Essential (primary) hypertension: Secondary | ICD-10-CM | POA: Insufficient documentation

## 2019-09-16 DIAGNOSIS — E7800 Pure hypercholesterolemia, unspecified: Secondary | ICD-10-CM | POA: Insufficient documentation

## 2019-09-16 DIAGNOSIS — Z955 Presence of coronary angioplasty implant and graft: Secondary | ICD-10-CM | POA: Insufficient documentation

## 2019-09-16 DIAGNOSIS — Z87891 Personal history of nicotine dependence: Secondary | ICD-10-CM | POA: Insufficient documentation

## 2019-09-16 DIAGNOSIS — G629 Polyneuropathy, unspecified: Secondary | ICD-10-CM | POA: Insufficient documentation

## 2019-09-16 DIAGNOSIS — Z79899 Other long term (current) drug therapy: Secondary | ICD-10-CM | POA: Insufficient documentation

## 2019-09-16 NOTE — Progress Notes (Signed)
Virtual follow up completed. No in person class this week.

## 2019-09-23 ENCOUNTER — Other Ambulatory Visit: Payer: Self-pay

## 2019-09-23 ENCOUNTER — Encounter: Payer: BC Managed Care – PPO | Admitting: *Deleted

## 2019-09-23 DIAGNOSIS — Z955 Presence of coronary angioplasty implant and graft: Secondary | ICD-10-CM

## 2019-09-23 DIAGNOSIS — Z79899 Other long term (current) drug therapy: Secondary | ICD-10-CM | POA: Diagnosis not present

## 2019-09-23 DIAGNOSIS — E7800 Pure hypercholesterolemia, unspecified: Secondary | ICD-10-CM | POA: Diagnosis not present

## 2019-09-23 DIAGNOSIS — I10 Essential (primary) hypertension: Secondary | ICD-10-CM | POA: Diagnosis not present

## 2019-09-23 DIAGNOSIS — Z7982 Long term (current) use of aspirin: Secondary | ICD-10-CM | POA: Diagnosis not present

## 2019-09-23 DIAGNOSIS — Z87891 Personal history of nicotine dependence: Secondary | ICD-10-CM | POA: Diagnosis not present

## 2019-09-23 DIAGNOSIS — G629 Polyneuropathy, unspecified: Secondary | ICD-10-CM | POA: Diagnosis not present

## 2019-09-23 NOTE — Progress Notes (Signed)
Daily Session Note  Patient Details  Name: Tamara Villegas MRN: [id number] Date of Birth: [birth year] Referring Provider:     Cardiac Rehab from 07/18/2019 in [facility] Cardiac and Pulmonary Rehab  Referring Provider  Paraschos      Encounter Date: 09/23/2019  Check In: Session Check In - 09/23/19 1702      Check-In   Supervising physician immediately available to respond to emergencies  See telemetry face sheet for immediately available ER MD    Location  [facility]-Cardiac & Pulmonary Rehab    Staff Present  Renita Papa, RN Moises Blood, BS, ACSM CEP, Exercise Physiologist;Joseph Tessie Fass RCP,RRT,BSRT    Virtual Visit  No    Medication changes reported      No    Fall or balance concerns reported     No    Warm-up and Cool-down  Performed on first and last piece of equipment    Resistance Training Performed  Yes    VAD Patient?  No    PAD/SET Patient?  No      Pain Assessment   Currently in Pain?  No/denies          Social History   Tobacco Use  Smoking Status Former Smoker  Smokeless Tobacco Never Used  Tobacco Comment   as teenager    Goals Met:  Independence with exercise equipment Exercise tolerated well No report of cardiac concerns or symptoms Strength training completed today  Goals Unmet:  Not Applicable  Comments: Pt able to follow exercise prescription today without complaint.  Will continue to monitor for progression.    Dr. Emily Filbert is Medical Director for [facility] and [facility].

## 2019-09-30 ENCOUNTER — Encounter: Payer: BC Managed Care – PPO | Attending: Cardiology

## 2019-09-30 DIAGNOSIS — Z955 Presence of coronary angioplasty implant and graft: Secondary | ICD-10-CM | POA: Insufficient documentation

## 2019-09-30 DIAGNOSIS — I10 Essential (primary) hypertension: Secondary | ICD-10-CM | POA: Insufficient documentation

## 2019-09-30 DIAGNOSIS — G629 Polyneuropathy, unspecified: Secondary | ICD-10-CM | POA: Insufficient documentation

## 2019-09-30 DIAGNOSIS — Z87891 Personal history of nicotine dependence: Secondary | ICD-10-CM | POA: Insufficient documentation

## 2019-09-30 DIAGNOSIS — Z7982 Long term (current) use of aspirin: Secondary | ICD-10-CM | POA: Insufficient documentation

## 2019-09-30 DIAGNOSIS — E7800 Pure hypercholesterolemia, unspecified: Secondary | ICD-10-CM | POA: Insufficient documentation

## 2019-09-30 DIAGNOSIS — Z79899 Other long term (current) drug therapy: Secondary | ICD-10-CM | POA: Insufficient documentation

## 2019-10-07 ENCOUNTER — Encounter: Payer: BC Managed Care – PPO | Admitting: *Deleted

## 2019-10-07 ENCOUNTER — Other Ambulatory Visit: Payer: Self-pay

## 2019-10-07 DIAGNOSIS — Z955 Presence of coronary angioplasty implant and graft: Secondary | ICD-10-CM

## 2019-10-07 NOTE — Progress Notes (Signed)
Daily Session Note  Patient Details  Name: Tamara Villegas MRN: [id number] Date of Birth: Jun 08, 1946 Referring Provider:     Cardiac Rehab from 07/18/2019 in [facility] Cardiac and Pulmonary Rehab  Referring Provider  Paraschos      Encounter Date: 10/07/2019  Check In: Session Check In - 10/07/19 1712      Check-In   Supervising physician immediately available to respond to emergencies  See telemetry face sheet for immediately available ER MD    Location  [facility]-Cardiac & Pulmonary Rehab    Staff Present  Nada Maclachlan, BA, ACSM CEP, Exercise Physiologist;Kelly Amedeo Plenty, BS, ACSM CEP, Exercise Physiologist;Kia Varnadore Sherryll Burger, RN BSN;Joseph Hood RCP,RRT,BSRT    Virtual Visit  No    Medication changes reported      No    Fall or balance concerns reported     No    Warm-up and Cool-down  Performed on first and last piece of equipment    Resistance Training Performed  Yes    VAD Patient?  No    PAD/SET Patient?  No      Pain Assessment   Currently in Pain?  No/denies          Social History   Tobacco Use  Smoking Status Former Smoker  Smokeless Tobacco Never Used  Tobacco Comment   as teenager    Goals Met:  Independence with exercise equipment Exercise tolerated well No report of cardiac concerns or symptoms Strength training completed today  Goals Unmet:  Not Applicable  Comments: Pt able to follow exercise prescription today without complaint.  Will continue to monitor for progression.    Dr. Emily Filbert is Medical Director for [facility] and [facility].

## 2019-10-09 ENCOUNTER — Encounter: Payer: Self-pay | Admitting: *Deleted

## 2019-10-09 DIAGNOSIS — Z955 Presence of coronary angioplasty implant and graft: Secondary | ICD-10-CM

## 2019-10-09 NOTE — Progress Notes (Signed)
Cardiac Individual Treatment Plan  Patient Details  Name: Tamara Villegas MRN: [id number] Date of Birth: [birth year] Referring Provider:     Cardiac Rehab from 07/18/2019 in [facility] Cardiac [facility] Pulmonary Re[facility]  Referring Provider  Paraschos      Initial Encounter Date:    Cardiac [facility]hab [facility]/18/[facility]  Date  07/18/19      Visit Diagnosis: S/P coronary artery stent placement  Patient's Home Medications on Admission:  Current Outpatient Medications:  .  acetaminophen (TYLENOL) 325 MG tablet, Take 650 mg by mouth every 6 (six) hours as needed for moderate pain or headache., Disp: , Rfl:  .  aspirin 81 MG tablet, Take 81 mg by mouth daily., Disp: , Rfl:  .  Calcium Carb-Cholecalciferol (CALCIUM 600 + D PO), Take 1 tablet by mouth 2 (two) times daily., Disp: , Rfl:  .  DULoxetine (CYMBALTA) 30 MG capsule, Take 30 mg by mouth daily., Disp: , Rfl:  .  latanoprost (XALATAN) 0.005 % ophthalmic solution, Place 1 drop into both eyes at bedtime., Disp: , Rfl:  .  lisinopril (ZESTRIL) 20 MG tablet, Take 20 mg by mouth daily., Disp: , Rfl:  .  lisinopril-hydrochlorothiazide (ZESTORETIC) 20-12.5 MG tablet, Take 1 tablet by mouth daily., Disp: , Rfl:  .  Multiple Vitamin (MULTIVITAMIN) tablet, Take 1 tablet by mouth daily., Disp: , Rfl:  .  Omega-3 Fatty Acids (FISH OIL) 1000 MG CAPS, Take 1,000-2,000 mg by mouth See admin instructions. Take 1000 mg in the morning and 2000 mg at night, Disp: , Rfl:  .  simvastatin (ZOCOR) 40 MG tablet, Take 40 mg by mouth at bedtime., Disp: , Rfl:  .  ticagrelor (BRILINTA) 90 MG TABS tablet, Take 1 tablet (90 mg total) by mouth 2 (two) times daily., Disp: 60 tablet, Rfl: 11 .  timolol (BETIMOL) 0.5 % ophthalmic solution, Place 1 drop into both eyes daily., Disp: , Rfl:   Past Medical History: Past Medical History:  Diagnosis Date  . Hepatitis 1993   B.   . Hypercholesteremia   . Hypertension   . Neuropathy    feet  . Wears  dentures    full upper, partial lower    Tobacco Use: Social History   Tobacco Use  Smoking Status Former Smoker  Smokeless Tobacco Never Used  Tobacco Comment   as teenager    Labs: Recent Review Flowsheet Data    There is no flowsheet data to display.       Exercise Target Goals: Exercise Program Goal: Individual exercise prescription set using results from initial 6 min walk test and THRR while considering  patient's activity barriers and safety.   Exercise Prescription Goal: Initial exercise prescription builds to 30-45 minutes a day of aerobic activity, 2-3 days per week.  Home exercise guidelines will be given to patient during program as part of exercise prescription that the participant will acknowledge.  Activity Barriers & Risk Stratification: Activity Barriers & Cardiac Risk Stratification - 07/10/19 1119      Activity Barriers & Cardiac Risk Stratification   Activity Barriers  Deconditioning;Other (comment);Balance Concerns;Muscular Weakness;History of Falls    Comments  nueropathy in both feet, twisted left ankle in August (Still bothering her), dizzy spells recently    Cardiac Risk Stratification  Moderate       6 Minute Walk: 6 Minute Walk    Row Name 07/18/19 1428         6 Minute Walk   Phase  Initial     Distance  1200 feet     Walk Time  6 minutes        Oxygen Initial Assessment:   Oxygen Re-Evaluation:   Oxygen Discharge (Final Oxygen Re-Evaluation):   Initial Exercise Prescription: Initial Exercise Prescription - 07/18/19 1400      Date of Initial Exercise RX and Referring Provider   Date  07/18/19    Referring Provider  Paraschos      Treadmill   MPH  2    Grade  0.5    Minutes  15    METs  2.6      Recumbant Bike   Level  2    RPM  60    Watts  18    Minutes  15    METs  2.6      NuStep   Level  2    SPM  80    Minutes  15    METs  2.6      REL-XR   Level  2    Speed  50    Minutes  15    METs  2.6       Biostep-RELP   Level  2    SPM  50    Minutes  15    METs  2.6      Prescription Details   Frequency (times per week)  3    Duration  Progress to 30 minutes of continuous aerobic without signs/symptoms of physical distress      Intensity   THRR 40-80% of Max Heartrate  98-130    Ratings of Perceived Exertion  11-15      Resistance Training   Training Prescription  Yes    Weight  4 lb    Reps  10-15       Perform Capillary Blood Glucose checks as needed.  Exercise Prescription Changes: Exercise Prescription Changes    Row Name 08/06/19 0800 08/21/19 1100 08/21/19 1700 09/03/19 1300 10/02/19 1300     Response to Exercise   Blood Pressure (Admit)  138/72  130/82  --  122/70  144/60   Blood Pressure (Exercise)  130/80  162/82  --  128/52  168/82   Blood Pressure (Exit)  130/68  118/62  --  122/56  110/62   Heart Rate (Admit)  84 bpm  92 bpm  --  85 bpm  90 bpm   Heart Rate (Exercise)  130 bpm  134 bpm  --  123 bpm  147 bpm   Heart Rate (Exit)  90 bpm  111 bpm  --  84 bpm  126 bpm   Rating of Perceived Exertion (Exercise)  --  15  --  15  15   Symptoms  none  none  --  none  none   Comments  first day   --  --  --  --   Duration  Continue with 30 min of aerobic exercise without signs/symptoms of physical distress.  Continue with 30 min of aerobic exercise without signs/symptoms of physical distress.  --  Continue with 30 min of aerobic exercise without signs/symptoms of physical distress.  Continue with 30 min of aerobic exercise without signs/symptoms of physical distress.   Intensity  THRR unchanged  THRR unchanged  --  THRR unchanged  THRR unchanged     Progression   Progression  Continue to progress workloads to maintain intensity without signs/symptoms of physical distress.  Continue to progress workloads to  maintain intensity without signs/symptoms of physical distress.  --  Continue to progress workloads to maintain intensity without signs/symptoms of physical distress.   Continue to progress workloads to maintain intensity without signs/symptoms of physical distress.   Average METs  2.5  2.82  --  3.7  3.48     Resistance Training   Training Prescription  Yes  Yes  --  Yes  Yes   Weight  4 lb  4 lb  --  4 lb  4 lb   Reps  10-15  10-15  --  10-15  10-15     Interval Training   Interval Training  No  No  --  No  No     Treadmill   MPH  2  2  --  --  2   Grade  0.5  1.5  --  --  3   Minutes  15  15  --  --  15   METs  2.6  2.94  --  --  3.36     NuStep   Level  2  2  --  3  3   SPM  80  --  --  80  80   Minutes  15  15  --  15  15   METs  2.4  2.5  --  3.7  3.6     Home Exercise Plan   Plans to continue exercise at  --  --  Home (comment) elliptical  Home (comment) elliptical  Home (comment) elliptical   Frequency  --  --  --  Add 1 additional day to program exercise sessions.  Add 1 additional day to program exercise sessions.   Initial Home Exercises Provided  --  --  --  08/21/19  08/21/19      Exercise Comments: Exercise Comments    Row Name 08/21/19 1711           Exercise Comments  Reviewed home exercise with pt today.  Pt plans to use home equipment for exercise.  Reviewed THR, pulse, RPE, sign and symptoms, NTG use, and when to call 911 or MD.  Also discussed weather considerations and indoor options.  Pt voiced understanding.          Exercise Goals and Review: Exercise Goals    Row Name 07/18/19 1443             Exercise Goals   Increase Physical Activity  Yes       Intervention  Provide advice, education, support and counseling about physical activity/exercise needs.;Develop an individualized exercise prescription for aerobic and resistive training based on initial evaluation findings, risk stratification, comorbidities and participant's personal goals.       Expected Outcomes  Short Term: Attend rehab on a regular basis to increase amount of physical activity.;Long Term: Add in home exercise to make exercise part of  routine and to increase amount of physical activity.;Long Term: Exercising regularly at least 3-5 days a week.       Increase Strength and Stamina  Yes       Intervention  Provide advice, education, support and counseling about physical activity/exercise needs.;Develop an individualized exercise prescription for aerobic and resistive training based on initial evaluation findings, risk stratification, comorbidities and participant's personal goals.       Expected Outcomes  Short Term: Increase workloads from initial exercise prescription for resistance, speed, and METs.;Short Term: Perform resistance training exercises routinely during rehab and add in resistance training at  home;Long Term: Improve cardiorespiratory fitness, muscular endurance and strength as measured by increased METs and functional capacity (6MWT)       Able to understand and use rate of perceived exertion (RPE) scale  Yes       Intervention  Provide education and explanation on how to use RPE scale       Expected Outcomes  Short Term: Able to use RPE daily in rehab to express subjective intensity level;Long Term:  Able to use RPE to guide intensity level when exercising independently       Knowledge and understanding of Target Heart Rate Range (THRR)  Yes       Intervention  Provide education and explanation of THRR including how the numbers were predicted and where they are located for reference       Expected Outcomes  Short Term: Able to state/look up THRR;Short Term: Able to use daily as guideline for intensity in rehab;Long Term: Able to use THRR to govern intensity when exercising independently       Able to check pulse independently  Yes       Intervention  Provide education and demonstration on how to check pulse in carotid and radial arteries.;Review the importance of being able to check your own pulse for safety during independent exercise       Expected Outcomes  Short Term: Able to explain why pulse checking is important  during independent exercise;Long Term: Able to check pulse independently and accurately       Understanding of Exercise Prescription  Yes       Intervention  Provide education, explanation, and written materials on patient's individual exercise prescription       Expected Outcomes  Short Term: Able to explain program exercise prescription;Long Term: Able to explain home exercise prescription to exercise independently          Exercise Goals Re-Evaluation : Exercise Goals Re-Evaluation    Row Name 08/05/19 1652 08/21/19 1117 09/02/19 1700 09/03/19 1310 09/20/19 0932     Exercise Goal Re-Evaluation   Exercise Goals Review  Increase Physical Activity;Able to understand and use rate of perceived exertion (RPE) scale;Knowledge and understanding of Target Heart Rate Range (THRR);Understanding of Exercise Prescription;Increase Strength and Stamina;Able to check pulse independently  Increase Physical Activity;Increase Strength and Stamina;Understanding of Exercise Prescription  Increase Physical Activity;Increase Strength and Stamina;Understanding of Exercise Prescription;Able to understand and use rate of perceived exertion (RPE) scale  Increase Physical Activity;Increase Strength and Stamina;Able to understand and use rate of perceived exertion (RPE) scale;Knowledge and understanding of Target Heart Rate Range (THRR);Able to check pulse independently;Understanding of Exercise Prescription  --   Comments  Reviewed RPE scale, THR and program prescription with pt today.  Pt voiced understanding and was given a copy of goals to take home.  Khloe Hunkele is doing well in rehab.  She has not had any symptoms since staring.  She is now up to 2.0 mph on the treadmill. We will continue to monitor her progress.  Kayron has recently increased her NS level to 3 and her TM level to 3.0/3.0%. She reports her SOB, which was her main limiting factor, has improved and she is able to do more at home without limitations. She is able to  walk faster and reports less fatigue. Leatrice has an elliptical at home she plans to start using once cardiac rehab classes are cut back to do covid closure. She plans to exercise at home when she can not come into rehab. She  was given our you tube channel information and plans to use those exercise and education videos as well.  --  Out since last review with the holidays   Expected Outcomes  Short: Use RPE daily to regulate intensity. Long: Follow program prescription in THR.  Short: Review home exercise guidelines.  Long: Continue to build stamina.  Short: Continue to attend cardiac rehab and exercise at home on off days. Long: maintain an independent exercise routine to help reduce cardiac risk factors.  --  --   Row Name 10/02/19 1315             Exercise Goal Re-Evaluation   Exercise Goals Review  Increase Physical Activity;Increase Strength and Stamina;Able to understand and use rate of perceived exertion (RPE) scale;Knowledge and understanding of Target Heart Rate Range (THRR);Able to check pulse independently;Understanding of Exercise Prescription       Comments  Kelvin has increased incline on TM.  She uses 4 lb weights for strength work.  Staff will monitor progress.       Expected Outcomes  Short :  continue to exercise consistently Long : maintain exercise on her own          Discharge Exercise Prescription (Final Exercise Prescription Changes): Exercise Prescription Changes - 10/02/19 1300      Response to Exercise   Blood Pressure (Admit)  144/60    Blood Pressure (Exercise)  168/82    Blood Pressure (Exit)  110/62    Heart Rate (Admit)  90 bpm    Heart Rate (Exercise)  147 bpm    Heart Rate (Exit)  126 bpm    Rating of Perceived Exertion (Exercise)  15    Symptoms  none    Duration  Continue with 30 min of aerobic exercise without signs/symptoms of physical distress.    Intensity  THRR unchanged      Progression   Progression  Continue to progress workloads to maintain intensity  without signs/symptoms of physical distress.    Average METs  3.48      Resistance Training   Training Prescription  Yes    Weight  4 lb    Reps  10-15      Interval Training   Interval Training  No      Treadmill   MPH  2    Grade  3    Minutes  15    METs  3.36      NuStep   Level  3    SPM  80    Minutes  15    METs  3.6      Home Exercise Plan   Plans to continue exercise at  Home (comment)   elliptical   Frequency  Add 1 additional day to program exercise sessions.    Initial Home Exercises Provided  08/21/19       Nutrition:  Target Goals: Understanding of nutrition guidelines, daily intake of sodium '1500mg'$ , cholesterol '200mg'$ , calories 30% from fat and 7% or less from saturated fats, daily to have 5 or more servings of fruits and vegetables.  Biometrics:    Nutrition Therapy Plan and Nutrition Goals: Nutrition Therapy & Goals - 07/25/19 1100      Nutrition Therapy   Diet  low Na, HH    Protein (specify units)  65g    Fiber  25 grams    Whole Grain Foods  3 servings    Saturated Fats  12 max. grams    Fruits and Vegetables  5 servings/day    Sodium  1.5 grams      Personal Nutrition Goals   Nutrition Goal  ST: switch to whole grain bread LT: get in better shape    Comments  B: V8 can  grits and water L: sandwich w/ water or tea D: cheese and crackers or popcorn. weekend: [place] or [place], roast or potatoes. Office: sausage biscuit at [employer] w/ water L: sometimes no lunch bring egg salad sandwich, bolonga and mayo sandwich, or out chick-fil-a. 3x 20 oz water/day. banana, apples, grapes, sweet potatoes, broccoli, salads, pinto or baked beans. Pt reports liking tuna fish in water. Discussed HH eating.      Intervention Plan   Intervention  Prescribe, educate and counsel regarding individualized specific dietary modifications aiming towards targeted core components such as weight, hypertension, lipid management, diabetes, heart failure and other  comorbidities.;Nutrition handout(s) given to patient.    Expected Outcomes  Short Term Goal: Understand basic principles of dietary content, such as calories, fat, sodium, cholesterol and nutrients.;Short Term Goal: A plan has been developed with personal nutrition goals set during dietitian appointment.;Long Term Goal: Adherence to prescribed nutrition plan.       Nutrition Assessments: Nutrition Assessments - 07/18/19 1418      MEDFICTS Scores   Pre Score  42       Nutrition Goals Re-Evaluation: Nutrition Goals Re-Evaluation    [place] Name 08/19/19 1653 09/23/19 1719           Goals   Nutrition Goal  ST: review HH eating handout LT: get in better shape  ST: No new goals at this time Lt: get in better shape      Comment  switched to whole grain bread. Will review handout on HH eating and will make goals next re-eval.  Pt reports no new changes at this time and would not like to make any goals right now.      Expected Outcome  ST: review HH eating handout LT: get in better shape  ST: No new goals at this time Lt: get in better shape         Nutrition Goals Discharge (Final Nutrition Goals Re-Evaluation): Nutrition Goals Re-Evaluation - 09/23/19 1719      Goals   Nutrition Goal  ST: No new goals at this time Lt: get in better shape    Comment  Pt reports no new changes at this time and would not like to make any goals right now.    Expected Outcome  ST: No new goals at this time Lt: get in better shape       Psychosocial: Target Goals: Acknowledge presence or absence of significant depression and/or stress, maximize coping skills, provide positive support system. Participant is able to verbalize types and ability to use techniques and skills needed for reducing stress and depression.   Initial Review & Psychosocial Screening: Initial Psych Review & Screening - 07/10/19 1112      Initial Review   Current issues with  Current Stress Concerns    Source of Stress Concerns   Chronic Illness;Unable to participate in former interests or hobbies;Unable to perform yard/household activities    Comments  Current stressor is health with having dizzy spells and risk of passing out.  She  has been lucky enought that they have not happened at work.  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      Family Dynamics   Good Support System?  Yes  Husband and daughter and granddaughter lives nearby     Barriers   Psychosocial barriers to participate in program  The patient should benefit from training in stress management and relaxation.;Psychosocial barriers identified (see note)      Screening Interventions   Interventions  Provide feedback about the scores to participant;To provide support and resources with identified psychosocial needs;Encouraged to exercise    Expected Outcomes  Short Term goal: Utilizing psychosocial counselor, staff and physician to assist with identification of specific Stressors or current issues interfering with healing process. Setting desired goal for each stressor or current issue identified.;Long Term Goal: Stressors or current issues are controlled or eliminated.;Short Term goal: Identification and review with participant of any Quality of Life or Depression concerns found by scoring the questionnaire.;Long Term goal: The participant improves quality of Life and PHQ9 Scores as seen by post scores and/or verbalization of changes       Quality of Life Scores:  Quality of Life - 07/18/19 1437      Quality of Life   Select  Quality of Life      Quality of Life Scores   Health/Function Pre  21.93 %    Socioeconomic Pre  28.75 %    Psych/Spiritual Pre  29.14 %    Family Pre  28.8 %    GLOBAL Pre  25.91 %      Scores of 19 and below usually indicate a poorer quality of life in these areas.  A difference of  2-3 points is a clinically meaningful difference.  A difference of 2-3 points in the total score of the Quality of Life  Index has been associated with significant improvement in overall quality of life, self-image, physical symptoms, and general health in studies assessing change in quality of life.  PHQ-9: Recent Review Flowsheet Data    Depression screen [facility] 2/9 07/18/2019   Decreased Interest 0   Down, Depressed, Hopeless 0   PHQ - 2 Score 0   Altered sleeping 0   Tired, decreased energy 3   Change in appetite 0   Feeling bad or failure about yourself  0   Trouble concentrating 0   Moving slowly or fidgety/restless 0   Suicidal thoughts 0   PHQ-9 Score 3   Difficult doing work/chores Not difficult at all     Interpretation of Total Score  Total Score Depression Severity:  1-4 = Minimal depression, 5-9 = Mild depression, 10-14 = Moderate depression, 15-19 = Moderately severe depression, 20-27 = Severe depression   Psychosocial Evaluation and Intervention: Psychosocial Evaluation - 07/10/19 1140      Psychosocial Evaluation & Interventions   Interventions  Stress management education;Encouraged to exercise with the program and follow exercise prescription    Comments  Jennise is doing well.  She has a strong support system. Her biggest stressors are her current health state as they try to solve her dizzy spells.  She is looking forward to exercise.  She also has a history of neuropathy in both feet that only limits her occassionally.  She has a granddaughter that lives near by but hasn't seen since March due to [place].  She also has a leaky valve that they are continuing to monitor.  She will need the 5pm time slot due to work.    Expected Outcomes  Short: Attend rehab regularly around work schedule.  Long: Continue to cope positively.    Continue Psychosocial Services   Follow up required by staff  Psychosocial Re-Evaluation: Psychosocial Re-Evaluation    [place] Name 09/02/19 1713 09/23/19 1727           Psychosocial Re-Evaluation   Current issues with  Current Stress Concerns  Current Sleep  Concerns;Current Stress Concerns      Comments  Kennesha reports no new stress concerns. She still deals with occational dizzy spells but this has improved since her doctor has adjusted her blood pressure medication.  Carmelle has no new stress concerns.  She sleeps well most of the time.      Expected Outcomes  Short: Attend [facility] stress management education to decrease stress. Long: Maintain exercise Post [facility] to keep stress at a minimum.  Short :  maintain positive outlook Long : manage stress long term      Interventions  Encouraged to attend Cardiac Rehabilitation for the exercise  --      Continue Psychosocial Services   Follow up required by staff  --         Psychosocial Discharge (Final Psychosocial Re-Evaluation): Psychosocial Re-Evaluation - 09/23/19 1727      Psychosocial Re-Evaluation   Current issues with  Current Sleep Concerns;Current Stress Concerns    Comments  Kerisha has no new stress concerns.  She sleeps well most of the time.    Expected Outcomes  Short :  maintain positive outlook Long : manage stress long term       Vocational Rehabilitation: Provide vocational rehab assistance to qualifying candidates.   Vocational Rehab Evaluation & Intervention: Vocational Rehab - 07/10/19 1118      Initial Vocational Rehab Evaluation & Intervention   Assessment shows need for Vocational Rehabilitation  No       Education: Education Goals: Education classes will be provided on a variety of topics geared toward better understanding of heart health and risk factor modification. Participant will state understanding/return demonstration of topics presented as noted by education test scores.  Learning Barriers/Preferences: Learning Barriers/Preferences - 07/10/19 1117      Learning Barriers/Preferences   Learning Barriers  Sight   glasses   Learning Preferences  Written Material;Verbal Instruction;Skilled Demonstration       Education Topics:  AED/CPR: - Group verbal and  written instruction with the use of models to demonstrate the basic use of the AED with the basic ABC's of resuscitation.   General Nutrition Guidelines/Fats and Fiber: -Group instruction provided by verbal, written material, models and posters to present the general guidelines for heart healthy nutrition. Gives an explanation and review of dietary fats and fiber.   Controlling Sodium/Reading Food Labels: -Group verbal and written material supporting the discussion of sodium use in heart healthy nutrition. Review and explanation with models, verbal and written materials for utilization of the food label.   Exercise Physiology & General Exercise Guidelines: - Group verbal and written instruction with models to review the exercise physiology of the cardiovascular system and associated critical values. Provides general exercise guidelines with specific guidelines to those with heart or lung disease.    Aerobic Exercise & Resistance Training: - Gives group verbal and written instruction on the various components of exercise. Focuses on aerobic and resistive training programs and the benefits of this training and how to safely progress through these programs..   Flexibility, Balance, Mind/Body Relaxation: Provides group verbal/written instruction on the benefits of flexibility and balance training, including mind/body exercise modes such as yoga, pilates and tai chi.  Demonstration and skill practice provided.   Cardiac Rehab from 08/15/2019 in [facility] Cardiac and  Pulmonary Rehab  Date  08/15/19  Educator  AS  Instruction Review Code  1- Verbalizes Understanding      Stress and Anxiety: - Provides group verbal and written instruction about the health risks of elevated stress and causes of high stress.  Discuss the correlation between heart/lung disease and anxiety and treatment options. Review healthy ways to manage with stress and anxiety.   Depression: - Provides group verbal and written  instruction on the correlation between heart/lung disease and depressed mood, treatment options, and the stigmas associated with seeking treatment.   Anatomy & Physiology of the Heart: - Group verbal and written instruction and models provide basic cardiac anatomy and physiology, with the coronary electrical and arterial systems. Review of Valvular disease and Heart Failure   Cardiac Procedures: - Group verbal and written instruction to review commonly prescribed medications for heart disease. Reviews the medication, class of the drug, and side effects. Includes the steps to properly store meds and maintain the prescription regimen. (beta blockers and nitrates)   Cardiac Medications I: - Group verbal and written instruction to review commonly prescribed medications for heart disease. Reviews the medication, class of the drug, and side effects. Includes the steps to properly store meds and maintain the prescription regimen.   Cardiac Medications II: -Group verbal and written instruction to review commonly prescribed medications for heart disease. Reviews the medication, class of the drug, and side effects. (all other drug classes)    Go Sex-Intimacy & Heart Disease, Get SMART - Goal Setting: - Group verbal and written instruction through game format to discuss heart disease and the return to sexual intimacy. Provides group verbal and written material to discuss and apply goal setting through the application of the S.M.A.R.T. Method.   Other Matters of the Heart: - Provides group verbal, written materials and models to describe Stable Angina and Peripheral Artery. Includes description of the disease process and treatment options available to the cardiac patient.   Exercise & Equipment Safety: - Individual verbal instruction and demonstration of equipment use and safety with use of the equipment.   Cardiac Rehab from 08/15/2019 in [facility] Cardiac and Pulmonary Rehab  Date  07/18/19  Educator   AS  Instruction Review Code  1- Verbalizes Understanding      Infection Prevention: - Provides verbal and written material to individual with discussion of infection control including proper hand washing and proper equipment cleaning during exercise session.   Cardiac Rehab from 08/15/2019 in [facility] Cardiac and Pulmonary Rehab  Date  07/18/19  Educator  AS  Instruction Review Code  1- Verbalizes Understanding      Falls Prevention: - Provides verbal and written material to individual with discussion of falls prevention and safety.   Cardiac Rehab from 08/15/2019 in [facility] Cardiac and Pulmonary Rehab  Date  07/18/19  Educator  AS  Instruction Review Code  1- Verbalizes Understanding      Diabetes: - Individual verbal and written instruction to review signs/symptoms of diabetes, desired ranges of glucose level fasting, after meals and with exercise. Acknowledge that pre and post exercise glucose checks will be done for 3 sessions at entry of program.   Know Your Numbers and Risk Factors: -Group verbal and written instruction about important numbers in your health.  Discussion of what are risk factors and how they play a role in the disease process.  Review of Cholesterol, Blood Pressure, Diabetes, and BMI and the role they play in your overall health.   Sleep Hygiene: -Provides  group verbal and written instruction about how sleep can affect your health.  Define sleep hygiene, discuss sleep cycles and impact of sleep habits. Review good sleep hygiene tips.    Other: -Provides group and verbal instruction on various topics (see comments)   Knowledge Questionnaire Score: Knowledge Questionnaire Score - 07/18/19 1438      Knowledge Questionnaire Score   Pre Score  22/26 HF exercise nutrition       Core Components/Risk Factors/Patient Goals at Admission: Personal Goals and Risk Factors at Admission - 07/18/19 1442      Core Components/Risk Factors/Patient Goals on Admission     Weight Management  Yes;Weight Loss    Intervention  Weight Management: Develop a combined nutrition and exercise program designed to reach desired caloric intake, while maintaining appropriate intake of nutrient and fiber, sodium and fats, and appropriate energy expenditure required for the weight goal.;Weight Management: Provide education and appropriate resources to help participant work on and attain dietary goals.    Admit Weight  188 lb 6.4 oz (85.5 kg)    Goal Weight: Short Term  180 lb (81.6 kg)    Goal Weight: Long Term  175 lb (79.4 kg)    Expected Outcomes  Short Term: Continue to assess and modify interventions until short term weight is achieved;Long Term: Adherence to nutrition and physical activity/exercise program aimed toward attainment of established weight goal;Weight Loss: Understanding of general recommendations for a balanced deficit meal plan, which promotes 1-2 lb weight loss per week and includes a negative energy balance of [phone] kcal/d;Understanding recommendations for meals to include 15-35% energy as protein, 25-35% energy from fat, 35-60% energy from carbohydrates, less than '200mg'$  of dietary cholesterol, 20-35 gm of total fiber daily;Understanding of distribution of calorie intake throughout the day with the consumption of 4-5 meals/snacks    Intervention  Provide education on lifestyle modifcations including regular physical activity/exercise, weight management, moderate sodium restriction and increased consumption of fresh fruit, vegetables, and low fat dairy, alcohol moderation, and smoking cessation.;Monitor prescription use compliance.    Expected Outcomes  Short Term: Continued assessment and intervention until BP is < 140/37m HG in hypertensive participants. < 130/817mHG in hypertensive participants with diabetes, heart failure or chronic kidney disease.;Long Term: Maintenance of blood pressure at goal levels.       Core Components/Risk Factors/Patient Goals  Review:  Goals and Risk Factor Review    Row Name 09/02/19 1717 09/23/19 1724           Core Components/Risk Factors/Patient Goals Review   Personal Goals Review  Weight Management/Obesity;Hypertension;Lipids  --      Review  JoSharlieas lost 8 lbs since starting cardiac rehab, changing diet, and exercising. She continues to take all meds as prescribed to contol blood pressure and lipids. Her BP meds were recently adjusted, which has helped improve lightheadedness. BP and lipids are reported to be in acceptable ranges.  JoMarkeaas continues to lose weight - total of 11 lb so far.  She is taking all meds as directed.  JoNorvas exercising at home  3 days a week      Expected Outcomes  Short: continue weight loss, short term weight goal 175 lbs. Long: Continue to maintain heart healthy lifestyle to control risk factors. Longer term weight goal 170 lbs.  Short - continue exercise and healthy eating Long: Reach goal weight         Core Components/Risk Factors/Patient Goals at Discharge (Final Review):  Goals and Risk Factor Review -  09/23/19 1724      Core Components/Risk Factors/Patient Goals Review   Review  Yarel has continues to lose weight - total of 11 lb so far.  She is taking all meds as directed.  Ciana is exercising at home  3 days a week    Expected Outcomes  Short - continue exercise and healthy eating Long: Reach goal weight       ITP Comments: ITP Comments    Row Name 07/10/19 1139 07/24/19 1213 07/25/19 1119 08/05/19 1651 08/14/19 0958   ITP Comments  Completed virtutal orientation.  Documentation for diagnosis can be found in [facility] encounter 06/12/19.  EP eval scheduled for Thursday 11/5.  Pt scheduled for holter monitor with follow up on 11/17.  Will check then to see when to start rehab officially.  Completed Initial RD eval  First full day of exercise!  Patient was oriented to gym and equipment including functions, settings, policies, and procedures.  Patient's individual exercise prescription and  treatment plan were reviewed.  All starting workloads were established based on the results of the 6 minute walk test done at initial orientation visit.  The plan for exercise progression was also introduced and progression will be customized based on patient's performance and goals.  30 day review competed . ITP sent to Dr Emily Filbert for review, changes as needed and ITP approval signature.  New to program   Row Name 08/21/19 1711 09/11/19 0842 09/16/19 1711 10/09/19 1129     ITP Comments  Reviewed home exercise with pt today.  Pt plans to use home equipment for exercise.  Reviewed THR, pulse, RPE, sign and symptoms, NTG use, and when to call 911 or MD.  Also discussed weather considerations and indoor options.  Pt voiced understanding.  30 day review competed . ITP sent to Dr Emily Filbert for review, changes as needed and ITP approval signature New to program  Virtual follow up completed. No in person class this week. Idy is doing well. She purchased some exercise equipment and is trying to get used to it. She is working on staying active while not in class. No other concerns noted.  30 day review completed. ITP sent to Dr. Emily Filbert, Medical Director of Cardiac and Pulmonary Rehab. Continue with ITP unless changes are made by physician.  Department operating under reduced schedule until further notice by request from hospital leadership.       Comments: 30 day review

## 2019-10-14 ENCOUNTER — Encounter: Payer: BC Managed Care – PPO | Attending: Cardiology | Admitting: *Deleted

## 2019-10-14 ENCOUNTER — Other Ambulatory Visit: Payer: Self-pay

## 2019-10-14 DIAGNOSIS — Z87891 Personal history of nicotine dependence: Secondary | ICD-10-CM | POA: Insufficient documentation

## 2019-10-14 DIAGNOSIS — Z79899 Other long term (current) drug therapy: Secondary | ICD-10-CM | POA: Insufficient documentation

## 2019-10-14 DIAGNOSIS — G629 Polyneuropathy, unspecified: Secondary | ICD-10-CM | POA: Insufficient documentation

## 2019-10-14 DIAGNOSIS — I10 Essential (primary) hypertension: Secondary | ICD-10-CM | POA: Insufficient documentation

## 2019-10-14 DIAGNOSIS — Z955 Presence of coronary angioplasty implant and graft: Secondary | ICD-10-CM | POA: Insufficient documentation

## 2019-10-14 DIAGNOSIS — E7800 Pure hypercholesterolemia, unspecified: Secondary | ICD-10-CM | POA: Insufficient documentation

## 2019-10-14 DIAGNOSIS — Z7982 Long term (current) use of aspirin: Secondary | ICD-10-CM | POA: Insufficient documentation

## 2019-10-14 NOTE — Progress Notes (Signed)
Follow up call completed. Tamara Villegas is doing well, staying active. She is looking forward to coming next week to Cardiac Rehab.

## 2019-10-21 ENCOUNTER — Encounter: Payer: BC Managed Care – PPO | Admitting: *Deleted

## 2019-10-21 ENCOUNTER — Other Ambulatory Visit: Payer: Self-pay

## 2019-10-21 DIAGNOSIS — Z955 Presence of coronary angioplasty implant and graft: Secondary | ICD-10-CM | POA: Diagnosis present

## 2019-10-21 DIAGNOSIS — Z79899 Other long term (current) drug therapy: Secondary | ICD-10-CM | POA: Diagnosis not present

## 2019-10-21 DIAGNOSIS — I10 Essential (primary) hypertension: Secondary | ICD-10-CM | POA: Diagnosis not present

## 2019-10-21 DIAGNOSIS — Z87891 Personal history of nicotine dependence: Secondary | ICD-10-CM | POA: Diagnosis not present

## 2019-10-21 DIAGNOSIS — E7800 Pure hypercholesterolemia, unspecified: Secondary | ICD-10-CM | POA: Diagnosis not present

## 2019-10-21 DIAGNOSIS — Z7982 Long term (current) use of aspirin: Secondary | ICD-10-CM | POA: Diagnosis not present

## 2019-10-21 DIAGNOSIS — G629 Polyneuropathy, unspecified: Secondary | ICD-10-CM | POA: Diagnosis not present

## 2019-10-21 NOTE — Progress Notes (Signed)
Daily Session Note  Patient Details  Name: Tamara Villegas MRN: [id number] Date of Birth: [birth year] Referring Provider:     Cardiac Rehab from 07/18/2019 in [facility] Cardiac and Pulmonary Rehab  Referring Provider  Paraschos      Encounter Date: 10/21/2019  Check In: Session Check In - 10/21/19 1706      Check-In   Supervising physician immediately available to respond to emergencies  See telemetry face sheet for immediately available ER MD    Location  [facility]-Cardiac & Pulmonary Rehab    Staff Present  Renita Papa, RN Moises Blood, BS, ACSM CEP, Exercise Physiologist;Joseph Tessie Fass RCP,RRT,BSRT    Virtual Visit  No    Medication changes reported      No    Fall or balance concerns reported     No    Warm-up and Cool-down  Performed on first and last piece of equipment    Resistance Training Performed  Yes    VAD Patient?  No    PAD/SET Patient?  No      Pain Assessment   Currently in Pain?  No/denies          Social History   Tobacco Use  Smoking Status Former Smoker  Smokeless Tobacco Never Used  Tobacco Comment   as teenager    Goals Met:  Independence with exercise equipment Exercise tolerated well No report of cardiac concerns or symptoms Strength training completed today  Goals Unmet:  Not Applicable  Comments: Pt able to follow exercise prescription today without complaint.  Will continue to monitor for progression.    Dr. Emily Filbert is Medical Director for [place] and [facility].

## 2019-10-24 ENCOUNTER — Other Ambulatory Visit: Payer: Self-pay

## 2019-10-24 ENCOUNTER — Encounter: Payer: BC Managed Care – PPO | Admitting: *Deleted

## 2019-10-24 DIAGNOSIS — Z955 Presence of coronary angioplasty implant and graft: Secondary | ICD-10-CM | POA: Diagnosis not present

## 2019-10-24 NOTE — Progress Notes (Signed)
Daily Session Note  Patient Details  Name: Tamara Villegas MRN: [id number] Date of Birth: Aug 22, 1946 Referring Provider:     Cardiac Rehab from 07/18/2019 in [facility] Cardiac and Pulmonary Rehab  Referring Provider  Paraschos      Encounter Date: 10/24/2019  Check In: Session Check In - 10/24/19 1718      Check-In   Supervising physician immediately available to respond to emergencies  See telemetry face sheet for immediately available ER MD    Location  [facility]-Cardiac & Pulmonary Rehab    Staff Present  Renita Papa, RN Moises Blood, BS, ACSM CEP, Exercise Physiologist;Joseph Tessie Fass RCP,RRT,BSRT    Virtual Visit  No    Medication changes reported      No    Fall or balance concerns reported     No    Warm-up and Cool-down  Performed on first and last piece of equipment    Resistance Training Performed  Yes    VAD Patient?  No    PAD/SET Patient?  No      Pain Assessment   Currently in Pain?  No/denies          Social History   Tobacco Use  Smoking Status Former Smoker  Smokeless Tobacco Never Used  Tobacco Comment   as teenager    Goals Met:  Independence with exercise equipment Exercise tolerated well No report of cardiac concerns or symptoms Strength training completed today  Goals Unmet:  Not Applicable  Comments: Pt able to follow exercise prescription today without complaint.  Will continue to monitor for progression.    Dr. Emily Filbert is Medical Director for [place] and [facility].

## 2019-10-25 ENCOUNTER — Ambulatory Visit: Payer: BC Managed Care – PPO | Attending: Internal Medicine

## 2019-10-25 DIAGNOSIS — Z23 Encounter for immunization: Secondary | ICD-10-CM | POA: Insufficient documentation

## 2019-10-25 NOTE — Progress Notes (Signed)
   Covid-19 Vaccination Clinic  Name:  Tamara Villegas    MRN: [id number] DOB: [birth year]  10/25/2019  Tamara Villegas was observed post Covid-19 immunization for 15 minutes without incidence. She was provided with Vaccine Information Sheet and instruction to access the V-Safe system.   Tamara Villegas was instructed to call 911 with any severe reactions post vaccine: Marland Kitchen Difficulty breathing  . Swelling of your face and throat  . A fast heartbeat  . A bad rash all over your body  . Dizziness and weakness    Immunizations Administered    Name Date Dose VIS Date Route   Pfizer COVID-19 Vaccine 10/25/2019 10:35 AM 0.3 mL 08/23/2019 Intramuscular   Manufacturer: [place]   Lot: [id number]   [place]: [id number]

## 2019-10-28 ENCOUNTER — Encounter: Payer: BC Managed Care – PPO | Admitting: *Deleted

## 2019-10-28 ENCOUNTER — Other Ambulatory Visit: Payer: Self-pay

## 2019-10-28 DIAGNOSIS — Z955 Presence of coronary angioplasty implant and graft: Secondary | ICD-10-CM

## 2019-10-28 NOTE — Progress Notes (Signed)
Daily Session Note  Patient Details  Name: Tamara Villegas MRN: [id number] Date of Birth: 01-22-1946 Referring Provider:     Cardiac Rehab from 07/18/2019 in [facility] Cardiac and Pulmonary Rehab  Referring Provider  Paraschos      Encounter Date: 10/28/2019  Check In: Session Check In - 10/28/19 1656      Check-In   Supervising physician immediately available to respond to emergencies  See telemetry face sheet for immediately available ER MD    Location  [facility]-Cardiac & Pulmonary Rehab    Staff Present  Earlean Shawl, BS, ACSM CEP, Exercise Physiologist;Joseph Hood RCP,RRT,BSRT;Melissa Caiola RDN, LDN;Meredith Sherryll Burger, RN BSN    Virtual Visit  No    Medication changes reported      No    Fall or balance concerns reported     No    Warm-up and Cool-down  Performed on first and last piece of equipment    Resistance Training Performed  Yes    VAD Patient?  No    PAD/SET Patient?  No      Pain Assessment   Currently in Pain?  No/denies    Multiple Pain Sites  No          Social History   Tobacco Use  Smoking Status Former Smoker  Smokeless Tobacco Never Used  Tobacco Comment   as teenager    Goals Met:  Independence with exercise equipment Exercise tolerated well Personal goals reviewed No report of cardiac concerns or symptoms Strength training completed today  Goals Unmet:  Not Applicable  Comments: Pt able to follow exercise prescription today without complaint.  Will continue to monitor for progression.    Dr. Emily Filbert is Medical Director for [place] and [facility].

## 2019-10-28 NOTE — Progress Notes (Signed)
Daily Session Note  Patient Details  Name: Tamara Villegas MRN: [id number] Date of Birth: 1946-05-24 Referring Provider:     Cardiac Rehab from 07/18/2019 in [facility] Cardiac and Pulmonary Rehab  Referring Provider  Paraschos      Encounter Date: 10/28/2019  Check In: Session Check In - 10/28/19 1656      Check-In   Supervising physician immediately available to respond to emergencies  See telemetry face sheet for immediately available ER MD    Location  [facility]-Cardiac & Pulmonary Rehab    Staff Present  Earlean Shawl, BS, ACSM CEP, Exercise Physiologist;Joseph Hood RCP,RRT,BSRT;Melissa Caiola RDN, LDN;Cayden Rautio Sherryll Burger, RN BSN    Virtual Visit  No    Medication changes reported      No    Fall or balance concerns reported     No    Warm-up and Cool-down  Performed on first and last piece of equipment    Resistance Training Performed  Yes    VAD Patient?  No    PAD/SET Patient?  No      Pain Assessment   Currently in Pain?  No/denies    Multiple Pain Sites  No          Social History   Tobacco Use  Smoking Status Former Smoker  Smokeless Tobacco Never Used  Tobacco Comment   as teenager    Goals Met:  Independence with exercise equipment Exercise tolerated well No report of cardiac concerns or symptoms Strength training completed today  Goals Unmet:  Not Applicable  Comments: Pt able to follow exercise prescription today without complaint.  Will continue to monitor for progression.    Dr. Emily Filbert is Medical Director for [place] and [facility].

## 2019-11-06 ENCOUNTER — Encounter: Payer: Self-pay | Admitting: *Deleted

## 2019-11-06 DIAGNOSIS — Z955 Presence of coronary angioplasty implant and graft: Secondary | ICD-10-CM

## 2019-11-06 NOTE — Progress Notes (Signed)
Cardiac Individual Treatment Plan  Patient Details  Name: Tamara Villegas MRN: [id number] Date of Birth: 12/10/1945 Referring Provider:     Cardiac Rehab from 07/18/2019 in [facility]  Referring Provider  Paraschos      Initial Encounter Date:    Cardiac Rehab from 07/18/2019 in [facility]iac [facility]  Date  07/18/19      Visit Diagnosis: S/P coronary artery stent placement  Patient's Home Medications on Admission:  Current Outpatient Medications:  .  acetaminophen (TYLENOL) 325 MG tablet, Take 650 mg by mouth every 6 (six) hours as needed for moderate pain or headache., Disp: , Rfl:  .  aspirin 81 MG tablet, Take 81 mg by mouth daily., Disp: , Rfl:  .  Calcium Carb-Cholecalciferol (CALCIUM 600 + D PO), Take 1 tablet by mouth 2 (two) times daily., Disp: , Rfl:  .  DULoxetine (CYMBALTA) 30 MG capsule, Take 30 mg by mouth daily., Disp: , Rfl:  .  latanoprost (XALATAN) 0.005 % ophthalmic solution, Place 1 drop into both eyes at bedtime., Disp: , Rfl:  .  lisinopril (ZESTRIL) 20 MG tablet, Take 20 mg by mouth daily., Disp: , Rfl:  .  lisinopril-hydrochlorothiazide (ZESTORETIC) 20-12.5 MG tablet, Take 1 tablet by mouth daily., Disp: , Rfl:  .  Multiple Vitamin (MULTIVITAMIN) tablet, Take 1 tablet by mouth daily., Disp: , Rfl:  .  Omega-3 Fatty Acids (FISH OIL) 1000 MG CAPS, Take 1,000-2,000 mg by mouth See admin instructions. Take 1000 mg in the morning and 2000 mg at night, Disp: , Rfl:  .  simvastatin (ZOCOR) 40 MG tablet, Take 40 mg by mouth at bedtime., Disp: , Rfl:  .  ticagrelor (BRILINTA) 90 MG TABS tablet, Take 1 tablet (90 mg total) by mouth 2 (two) times daily., Disp: 60 tablet, Rfl: 11 .  timolol (BETIMOL) 0.5 % ophthalmic solution, Place 1 drop into both eyes daily., Disp: , Rfl:   Past Medical History: Past Medical History:  Diagnosis Date  . Hepatitis 1993   B.   . Hypercholesteremia   . Hypertension   . Neuropathy    feet  . Wears  dentures    full upper, partial lower    Tobacco Use: Social History   Tobacco Use  Smoking Status Former Smoker  Smokeless Tobacco Never Used  Tobacco Comment   as teenager    Labs: Recent Review Flowsheet Data    There is no flowsheet data to display.       Exercise Target Goals: Exercise Program Goal: Individual exercise prescription set using results from initial 6 min walk test and THRR while considering  patient's activity barriers and safety.   Exercise Prescription Goal: Initial exercise prescription builds to 30-45 minutes a day of aerobic activity, 2-3 days per week.  Home exercise guidelines will be given to patient during program as part of exercise prescription that the participant will acknowledge.  Activity Barriers & Risk Stratification: Activity Barriers & Cardiac Risk Stratification - 07/10/19 1119      Activity Barriers & Cardiac Risk Stratification   Activity Barriers  Deconditioning;Other (comment);Balance Concerns;Muscular Weakness;History of Falls    Comments  nueropathy in both feet, twisted left ankle in August (Still bothering her), dizzy spells recently    Cardiac Risk Stratification  Moderate       6 Minute Walk: 6 Minute Walk    Row Name 07/18/19 1428         6 Minute Walk   Phase  Initial     Distance  1200 feet     Walk Time  6 minutes        Oxygen Initial Assessment:   Oxygen Re-Evaluation:   Oxygen Discharge (Final Oxygen Re-Evaluation):   Initial Exercise Prescription: Initial Exercise Prescription - 07/18/19 1400      Date of Initial Exercise RX and Referring Provider   Date  07/18/19    Referring Provider  Paraschos      Treadmill   MPH  2    Grade  0.5    Minutes  15    METs  2.6      Recumbant Bike   Level  2    RPM  60    Watts  18    Minutes  15    METs  2.6      NuStep   Level  2    SPM  80    Minutes  15    METs  2.6      REL-XR   Level  2    Speed  50    Minutes  15    METs  2.6       Biostep-RELP   Level  2    SPM  50    Minutes  15    METs  2.6      Prescription Details   Frequency (times per week)  3    Duration  Progress to 30 minutes of continuous aerobic without signs/symptoms of physical distress      Intensity   THRR 40-80% of Max Heartrate  98-130    Ratings of Perceived Exertion  11-15      Resistance Training   Training Prescription  Yes    Weight  4 lb    Reps  10-15       Perform Capillary Blood Glucose checks as needed.  Exercise Prescription Changes: Exercise Prescription Changes    Row Name 08/06/19 0800 08/21/19 1100 08/21/19 1700 09/03/19 1300 10/02/19 1300     Response to Exercise   Blood Pressure (Admit)  138/72  130/82  -  122/70  144/60   Blood Pressure (Exercise)  130/80  162/82  -  128/52  168/82   Blood Pressure (Exit)  130/68  118/62  -  122/56  110/62   Heart Rate (Admit)  84 bpm  92 bpm  -  85 bpm  90 bpm   Heart Rate (Exercise)  130 bpm  134 bpm  -  123 bpm  147 bpm   Heart Rate (Exit)  90 bpm  111 bpm  -  84 bpm  126 bpm   Rating of Perceived Exertion (Exercise)  -  15  -  15  15   Symptoms  none  none  -  none  none   Comments  first day   -  -  -  -   Duration  Continue with 30 min of aerobic exercise without signs/symptoms of physical distress.  Continue with 30 min of aerobic exercise without signs/symptoms of physical distress.  -  Continue with 30 min of aerobic exercise without signs/symptoms of physical distress.  Continue with 30 min of aerobic exercise without signs/symptoms of physical distress.   Intensity  THRR unchanged  THRR unchanged  -  THRR unchanged  THRR unchanged     Progression   Progression  Continue to progress workloads to maintain intensity without signs/symptoms of physical distress.  Continue to progress workloads to  maintain intensity without signs/symptoms of physical distress.  -  Continue to progress workloads to maintain intensity without signs/symptoms of physical distress.  Continue to  progress workloads to maintain intensity without signs/symptoms of physical distress.   Average METs  2.5  2.82  -  3.7  3.48     Resistance Training   Training Prescription  Yes  Yes  -  Yes  Yes   Weight  4 lb  4 lb  -  4 lb  4 lb   Reps  10-15  10-15  -  10-15  10-15     Interval Training   Interval Training  No  No  -  No  No     Treadmill   MPH  2  2  -  -  2   Grade  0.5  1.5  -  -  3   Minutes  15  15  -  -  15   METs  2.6  2.94  -  -  3.36     NuStep   Level  2  2  -  3  3   SPM  80  -  -  80  80   Minutes  15  15  -  15  15   METs  2.4  2.5  -  3.7  3.6     Home Exercise Plan   Plans to continue exercise at  -  -  Home (comment) elliptical  Home (comment) elliptical  Home (comment) elliptical   Frequency  -  -  -  Add 1 additional day to program exercise sessions.  Add 1 additional day to program exercise sessions.   Initial Home Exercises Provided  -  -  -  08/21/19  08/21/19   Row Name 10/16/19 1000 10/29/19 1200           Response to Exercise   Blood Pressure (Admit)  124/64  148/58      Blood Pressure (Exercise)  130/70  166/74      Blood Pressure (Exit)  112/60  142/80      Heart Rate (Admit)  73 bpm  78 bpm      Heart Rate (Exercise)  133 bpm  145 bpm      Heart Rate (Exit)  91 bpm  107 bpm      Rating of Perceived Exertion (Exercise)  15  14      Symptoms  none  none      Duration  Continue with 30 min of aerobic exercise without signs/symptoms of physical distress.  Continue with 30 min of aerobic exercise without signs/symptoms of physical distress.      Intensity  THRR unchanged  THRR unchanged        Progression   Progression  Continue to progress workloads to maintain intensity without signs/symptoms of physical distress.  Continue to progress workloads to maintain intensity without signs/symptoms of physical distress.      Average METs  2.63  3.45        Resistance Training   Training Prescription  Yes  Yes      Weight  4 lb   4lb      Reps   10-15  10-15        Interval Training   Interval Training  No  No        Treadmill   MPH  2  -      Grade  3  -  Minutes  15  -      METs  3.36  -        NuStep   Level  -  4      SPM  -  80      Minutes  -  15      METs  -  3.9        REL-XR   Level  2  -      Minutes  15  -      METs  1.9  -        Biostep-RELP   Level  -  2      SPM  -  50      Minutes  -  15      METs  -  3        Home Exercise Plan   Plans to continue exercise at  Home (comment) elliptical  Home (comment) elliptical      Frequency  Add 1 additional day to program exercise sessions.  Add 1 additional day to program exercise sessions.      Initial Home Exercises Provided  08/21/19  08/21/19         Exercise Comments: Exercise Comments    Row Name 08/21/19 1711           Exercise Comments  Reviewed home exercise with pt today.  Pt plans to use home equipment for exercise.  Reviewed THR, pulse, RPE, sign and symptoms, NTG use, and when to call 911 or MD.  Also discussed weather considerations and indoor options.  Pt voiced understanding.          Exercise Goals and Review: Exercise Goals    Row Name 07/18/19 1443             Exercise Goals   Increase Physical Activity  Yes       Intervention  Provide advice, education, support and counseling about physical activity/exercise needs.;Develop an individualized exercise prescription for aerobic and resistive training based on initial evaluation findings, risk stratification, comorbidities and participant's personal goals.       Expected Outcomes  Short Term: Attend rehab on a regular basis to increase amount of physical activity.;Long Term: Add in home exercise to make exercise part of routine and to increase amount of physical activity.;Long Term: Exercising regularly at least 3-5 days a week.       Increase Strength and Stamina  Yes       Intervention  Provide advice, education, support and counseling about physical activity/exercise  needs.;Develop an individualized exercise prescription for aerobic and resistive training based on initial evaluation findings, risk stratification, comorbidities and participant's personal goals.       Expected Outcomes  Short Term: Increase workloads from initial exercise prescription for resistance, speed, and METs.;Short Term: Perform resistance training exercises routinely during rehab and add in resistance training at home;Long Term: Improve cardiorespiratory fitness, muscular endurance and strength as measured by increased METs and functional capacity (6MWT)       Able to understand and use rate of perceived exertion (RPE) scale  Yes       Intervention  Provide education and explanation on how to use RPE scale       Expected Outcomes  Short Term: Able to use RPE daily in rehab to express subjective intensity level;Long Term:  Able to use RPE to guide intensity level when exercising independently       Knowledge and understanding of Target  Heart Rate Range (THRR)  Yes       Intervention  Provide education and explanation of THRR including how the numbers were predicted and where they are located for reference       Expected Outcomes  Short Term: Able to state/look up THRR;Short Term: Able to use daily as guideline for intensity in rehab;Long Term: Able to use THRR to govern intensity when exercising independently       Able to check pulse independently  Yes       Intervention  Provide education and demonstration on how to check pulse in carotid and radial arteries.;Review the importance of being able to check your own pulse for safety during independent exercise       Expected Outcomes  Short Term: Able to explain why pulse checking is important during independent exercise;Long Term: Able to check pulse independently and accurately       Understanding of Exercise Prescription  Yes       Intervention  Provide education, explanation, and written materials on patient's individual exercise prescription        Expected Outcomes  Short Term: Able to explain program exercise prescription;Long Term: Able to explain home exercise prescription to exercise independently          Exercise Goals Re-Evaluation : Exercise Goals Re-Evaluation    Row Name 08/05/19 1652 08/21/19 1117 09/02/19 1700 09/03/19 1310 09/20/19 0932     Exercise Goal Re-Evaluation   Exercise Goals Review  Increase Physical Activity;Able to understand and use rate of perceived exertion (RPE) scale;Knowledge and understanding of Target Heart Rate Range (THRR);Understanding of Exercise Prescription;Increase Strength and Stamina;Able to check pulse independently  Increase Physical Activity;Increase Strength and Stamina;Understanding of Exercise Prescription  Increase Physical Activity;Increase Strength and Stamina;Understanding of Exercise Prescription;Able to understand and use rate of perceived exertion (RPE) scale  Increase Physical Activity;Increase Strength and Stamina;Able to understand and use rate of perceived exertion (RPE) scale;Knowledge and understanding of Target Heart Rate Range (THRR);Able to check pulse independently;Understanding of Exercise Prescription  -   Comments  Reviewed RPE scale, THR and program prescription with pt today.  Pt voiced understanding and was given a copy of goals to take home.  Tamara Villegas is doing well in rehab.  She has not had any symptoms since staring.  She is now up to 2.0 mph on the treadmill. We will continue to monitor her progress.  Tamara Villegas has recently increased her NS level to 3 and her TM level to 3.0/3.0%. She reports her SOB, which was her main limiting factor, has improved and she is able to do more at home without limitations. She is able to walk faster and reports less fatigue. Tamara Villegas has an elliptical at home she plans to start using once cardiac rehab classes are cut back to do covid closure. She plans to exercise at home when she can not come into rehab. She was given our you tube channel information  and plans to use those exercise and education videos as well.  -  Out since last review with the holidays   Expected Outcomes  Short: Use RPE daily to regulate intensity. Long: Follow program prescription in THR.  Short: Review home exercise guidelines.  Long: Continue to build stamina.  Short: Continue to attend cardiac rehab and exercise at home on off days. Long: maintain an independent exercise routine to help reduce cardiac risk factors.  -  -   Row Name 10/02/19 1315 10/16/19 1055 10/28/19 1710  Exercise Goal Re-Evaluation   Exercise Goals Review  Increase Physical Activity;Increase Strength and Stamina;Able to understand and use rate of perceived exertion (RPE) scale;Knowledge and understanding of Target Heart Rate Range (THRR);Able to check pulse independently;Understanding of Exercise Prescription  Increase Physical Activity;Increase Strength and Stamina;Understanding of Exercise Prescription  Increase Physical Activity;Increase Strength and Stamina;Understanding of Exercise Prescription     Comments  Tamara Villegas has increased incline on TM.  She uses 4 lb weights for strength work.  Staff will monitor progress.  Tamara Villegas continues to do well in rehab.  She is now up to level 2 on the XR.  We will continue to monitor her progress.  Tamara Villegas continues to attend Cardiac Rehab 2 days per week and exercises at home one extra day outside of class. Encouraged her to add 2 days of home exercise outside of class for a total of 4 days a week. Tamara Villegas continues to progress with workloads and gaining strength and stamina.     Expected Outcomes  Short :  continue to exercise consistently Long : maintain exercise on her own  Short: Continue to attend class regularly as we transition back to schedule.  Long: Continue to improve stamina.  Short: continue to attend Cardiac Rehab classes and add 1-2 days of exercise at home. Long: Become independent with exercise program.        Discharge Exercise Prescription (Final Exercise  Prescription Changes): Exercise Prescription Changes - 10/29/19 1200      Response to Exercise   Blood Pressure (Admit)  148/58    Blood Pressure (Exercise)  166/74    Blood Pressure (Exit)  142/80    Heart Rate (Admit)  78 bpm    Heart Rate (Exercise)  145 bpm    Heart Rate (Exit)  107 bpm    Rating of Perceived Exertion (Exercise)  14    Symptoms  none    Duration  Continue with 30 min of aerobic exercise without signs/symptoms of physical distress.    Intensity  THRR unchanged      Progression   Progression  Continue to progress workloads to maintain intensity without signs/symptoms of physical distress.    Average METs  3.45      Resistance Training   Training Prescription  Yes    Weight   4lb    Reps  10-15      Interval Training   Interval Training  No      NuStep   Level  4    SPM  80    Minutes  15    METs  3.9      Biostep-RELP   Level  2    SPM  50    Minutes  15    METs  3      Home Exercise Plan   Plans to continue exercise at  Home (comment)   elliptical   Frequency  Add 1 additional day to program exercise sessions.    Initial Home Exercises Provided  08/21/19       Nutrition:  Target Goals: Understanding of nutrition guidelines, daily intake of sodium <1584m, cholesterol <2077m calories 30% from fat and 7% or less from saturated fats, daily to have 5 or more servings of fruits and vegetables.  Biometrics:    Nutrition Therapy Plan and Nutrition Goals: Nutrition Therapy & Goals - 07/25/19 1100      Nutrition Therapy   Diet  low Na, HH    Protein (specify units)  65g  Fiber  25 grams    Whole Grain Foods  3 servings    Saturated Fats  12 max. grams    Fruits and Vegetables  5 servings/day    Sodium  1.5 grams      Personal Nutrition Goals   Nutrition Goal  ST: switch to whole grain bread LT: get in better shape    Comments  B: V8 can  grits and water L: sandwich w/ water or tea D: cheese and crackers or popcorn. weekend: [place] or  [place], roast or potatoes. Office: sausage biscuit at [employer] w/ water L: sometimes no lunch bring egg salad sandwich, [place] and mayo sandwich, or out chick-fil-a. 3x 20 oz water/day. banana, apples, grapes, sweet potatoes, broccoli, salads, pinto or baked beans. Pt reports liking tuna fish in water. Discussed HH eating.      Intervention Plan   Intervention  Prescribe, educate and counsel regarding individualized specific dietary modifications aiming towards targeted core components such as weight, hypertension, lipid management, diabetes, heart failure and other comorbidities.;Nutrition handout(s) given to patient.    Expected Outcomes  Short Term Goal: Understand basic principles of dietary content, such as calories, fat, sodium, cholesterol and nutrients.;Short Term Goal: A plan has been developed with personal nutrition goals set during dietitian appointment.;Long Term Goal: Adherence to prescribed nutrition plan.       Nutrition Assessments: Nutrition Assessments - 07/18/19 1418      MEDFICTS Scores   Pre Score  42       Nutrition Goals Re-Evaluation: Nutrition Goals Re-Evaluation    Tamara Villegas Name 08/19/19 1653 09/23/19 1719 10/28/19 1702         Goals   Nutrition Goal  ST: review HH eating handout LT: get in better shape  ST: No new goals at this time Lt: get in better shape  ST: No new goals at this time Lt: get in better shape     Comment  switched to whole grain bread. Will review handout on HH eating and will make goals next re-eval.  Pt reports no new changes at this time and would not like to make any goals right now.  Pt reports no new changes at this time and would not like to make any goals right now.     Expected Outcome  ST: review HH eating handout LT: get in better shape  ST: No new goals at this time Lt: get in better shape  ST: No new goals at this time Lt: get in better shape        Nutrition Goals Discharge (Final Nutrition Goals Re-Evaluation): Nutrition Goals  Re-Evaluation - 10/28/19 1702      Goals   Nutrition Goal  ST: No new goals at this time Lt: get in better shape    Comment  Pt reports no new changes at this time and would not like to make any goals right now.    Expected Outcome  ST: No new goals at this time Lt: get in better shape       Psychosocial: Target Goals: Acknowledge presence or absence of significant depression and/or stress, maximize coping skills, provide positive support system. Participant is able to verbalize types and ability to use techniques and skills needed for reducing stress and depression.   Initial Review & Psychosocial Screening: Initial Psych Review & Screening - 07/10/19 1112      Initial Review   Current issues with  Current Stress Concerns    Source of Stress Concerns  Chronic Illness;Unable  to participate in former interests or hobbies;Unable to perform yard/household activities    Comments  Current stressor is health with having dizzy spells and risk of passing out.  She  has been lucky enought that they have not happened at work.  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      Family Dynamics   Good Support System?  Yes   Husband and daughter and granddaughter lives nearby     Barriers   Psychosocial barriers to participate in program  The patient should benefit from training in stress management and relaxation.;Psychosocial barriers identified (see note)      Screening Interventions   Interventions  Provide feedback about the scores to participant;To provide support and resources with identified psychosocial needs;Encouraged to exercise    Expected Outcomes  Short Term goal: Utilizing psychosocial counselor, staff and physician to assist with identification of specific Stressors or current issues interfering with healing process. Setting desired goal for each stressor or current issue identified.;Long Term Goal: Stressors or current issues are controlled or eliminated.;Short Term  goal: Identification and review with participant of any Quality of Life or Depression concerns found by scoring the questionnaire.;Long Term goal: The participant improves quality of Life and PHQ9 Scores as seen by post scores and/or verbalization of changes       Quality of Life Scores:  Quality of Life - 07/18/19 1437      Quality of Life   Select  Quality of Life      Quality of Life Scores   Health/Function Pre  21.93 %    Socioeconomic Pre  28.75 %    Psych/Spiritual Pre  29.14 %    Family Pre  28.8 %    GLOBAL Pre  25.91 %      Scores of 19 and below usually indicate a poorer quality of life in these areas.  A difference of  2-3 points is a clinically meaningful difference.  A difference of 2-3 points in the total score of the Quality of Life Index has been associated with significant improvement in overall quality of life, self-image, physical symptoms, and general health in studies assessing change in quality of life.  PHQ-9: Recent Review Flowsheet Data    Depression screen [facility] 2/9 07/18/2019   Decreased Interest 0   Down, Depressed, Hopeless 0   PHQ - 2 Score 0   Altered sleeping 0   Tired, decreased energy 3   Change in appetite 0   Feeling bad or failure about yourself  0   Trouble concentrating 0   Moving slowly or fidgety/restless 0   Suicidal thoughts 0   PHQ-9 Score 3   Difficult doing work/chores Not difficult at all     Interpretation of Total Score  Total Score Depression Severity:  1-4 = Minimal depression, 5-9 = Mild depression, 10-14 = Moderate depression, 15-19 = Moderately severe depression, 20-27 = Severe depression   Psychosocial Evaluation and Intervention: Psychosocial Evaluation - 07/10/19 1140      Psychosocial Evaluation & Interventions   Interventions  Stress management education;Encouraged to exercise with the program and follow exercise prescription    Comments  Kisa is doing well.  She has a strong support system. Her biggest stressors are  her current health state as they try to solve her dizzy spells.  She is looking forward to exercise.  She also has a history of neuropathy in both feet that only limits her occassionally.  She has a granddaughter  that lives near by but hasn't seen since March due to [place].  She also has a leaky valve that they are continuing to monitor.  She will need the 5pm time slot due to work.    Expected Outcomes  Short: Attend rehab regularly around work schedule.  Long: Continue to cope positively.    Continue Psychosocial Services   Follow up required by staff       Psychosocial Re-Evaluation: Psychosocial Re-Evaluation    Tamara Villegas Name 09/02/19 1713 09/23/19 1727 10/28/19 1715         Psychosocial Re-Evaluation   Current issues with  Current Stress Concerns  Current Sleep Concerns;Current Stress Concerns  Current Sleep Concerns;Current Stress Concerns     Comments  Tamara Villegas reports no new stress concerns. She still deals with occational dizzy spells but this has improved since her doctor has adjusted her blood pressure medication.  Tamara Villegas has no new stress concerns.  She sleeps well most of the time.  Tamara Villegas has no new stress concerns.  She sleeps well most of the time.     Expected Outcomes  Short: Attend [facility] stress management education to decrease stress. Long: Maintain exercise [facility] to keep stress at a minimum.  Short :  maintain positive outlook Long : manage stress long term  Short :  maintain positive outlook Long : manage stress long term     Interventions  Encouraged to attend Cardiac Rehabilitation for the exercise  -  Encouraged to attend Cardiac Rehabilitation for the exercise     Continue Psychosocial Services   Follow up required by staff  -  Follow up required by staff        Psychosocial Discharge (Final Psychosocial Re-Evaluation): Psychosocial Re-Evaluation - 10/28/19 1715      Psychosocial Re-Evaluation   Current issues with  Current Sleep Concerns;Current Stress Concerns     Comments  Tamara Villegas has no new stress concerns.  She sleeps well most of the time.    Expected Outcomes  Short :  maintain positive outlook Long : manage stress long term    Interventions  Encouraged to attend Cardiac Rehabilitation for the exercise    Continue Psychosocial Services   Follow up required by staff       Vocational Rehabilitation: Provide vocational rehab assistance to qualifying candidates.   Vocational Rehab Evaluation & Intervention: Vocational Rehab - 07/10/19 1118      Initial Vocational Rehab Evaluation & Intervention   Assessment shows need for Vocational Rehabilitation  No       Education: Education Goals: Education classes will be provided on a variety of topics geared toward better understanding of heart health and risk factor modification. Participant will state understanding/return demonstration of topics presented as noted by education test scores.  Learning Barriers/Preferences: Learning Barriers/Preferences - 07/10/19 1117      Learning Barriers/Preferences   Learning Barriers  Sight   glasses   Learning Preferences  Written Material;Verbal Instruction;Skilled Demonstration       Education Topics:  AED/CPR: - Group verbal and written instruction with the use of models to demonstrate the basic use of the AED with the basic ABC's of resuscitation.   General Nutrition Guidelines/Fats and Fiber: -Group instruction provided by verbal, written material, models and posters to present the general guidelines for heart healthy nutrition. Gives an explanation and review of dietary fats and fiber.   Controlling Sodium/Reading Food Labels: -Group verbal and written material supporting the discussion of sodium use in heart healthy nutrition. Review and  explanation with models, verbal and written materials for utilization of the food label.   Exercise Physiology & General Exercise Guidelines: - Group verbal and written instruction with models to review the  exercise physiology of the cardiovascular system and associated critical values. Provides general exercise guidelines with specific guidelines to those with heart or lung disease.    Aerobic Exercise & Resistance Training: - Gives group verbal and written instruction on the various components of exercise. Focuses on aerobic and resistive training programs and the benefits of this training and how to safely progress through these programs..   Flexibility, Balance, Mind/Body Relaxation: Provides group verbal/written instruction on the benefits of flexibility and balance training, including mind/body exercise modes such as yoga, pilates and tai chi.  Demonstration and skill practice provided.   Cardiac Rehab from 08/15/2019 in [facility] Cardiac and Pulmonary Rehab  Date  08/15/19  Educator  AS  Instruction Review Code  1- Verbalizes Understanding      Stress and Anxiety: - Provides group verbal and written instruction about the health risks of elevated stress and causes of high stress.  Discuss the correlation between heart/lung disease and anxiety and treatment options. Review healthy ways to manage with stress and anxiety.   Depression: - Provides group verbal and written instruction on the correlation between heart/lung disease and depressed mood, treatment options, and the stigmas associated with seeking treatment.   Anatomy & Physiology of the Heart: - Group verbal and written instruction and models provide basic cardiac anatomy and physiology, with the coronary electrical and arterial systems. Review of Valvular disease and Heart Failure   Cardiac Procedures: - Group verbal and written instruction to review commonly prescribed medications for heart disease. Reviews the medication, class of the drug, and side effects. Includes the steps to properly store meds and maintain the prescription regimen. (beta blockers and nitrates)   Cardiac Medications I: - Group verbal and written  instruction to review commonly prescribed medications for heart disease. Reviews the medication, class of the drug, and side effects. Includes the steps to properly store meds and maintain the prescription regimen.   Cardiac Medications II: -Group verbal and written instruction to review commonly prescribed medications for heart disease. Reviews the medication, class of the drug, and side effects. (all other drug classes)    Go Sex-Intimacy & Heart Disease, Get SMART - Goal Setting: - Group verbal and written instruction through game format to discuss heart disease and the return to sexual intimacy. Provides group verbal and written material to discuss and apply goal setting through the application of the S.M.A.R.T. Method.   Other Matters of the Heart: - Provides group verbal, written materials and models to describe Stable Angina and Peripheral Artery. Includes description of the disease process and treatment options available to the cardiac patient.   Exercise & Equipment Safety: - Individual verbal instruction and demonstration of equipment use and safety with use of the equipment.   Cardiac Rehab from 08/15/2019 in [facility] Cardiac and Pulmonary Rehab  Date  07/18/19  Educator  AS  Instruction Review Code  1- Verbalizes Understanding      Infection Prevention: - Provides verbal and written material to individual with discussion of infection control including proper hand washing and proper equipment cleaning during exercise session.   Cardiac Rehab from 08/15/2019 in [facility] Cardiac and Pulmonary Rehab  Date  07/18/19  Educator  AS  Instruction Review Code  1- Verbalizes Understanding      Falls Prevention: - Provides verbal and written  material to individual with discussion of falls prevention and safety.   Cardiac Rehab from 08/15/2019 in [facility] Cardiac and Pulmonary Rehab  Date  07/18/19  Educator  AS  Instruction Review Code  1- Verbalizes Understanding      Diabetes: -  Individual verbal and written instruction to review signs/symptoms of diabetes, desired ranges of glucose level fasting, after meals and with exercise. Acknowledge that pre and post exercise glucose checks will be done for 3 sessions at entry of program.   Know Your Numbers and Risk Factors: -Group verbal and written instruction about important numbers in your health.  Discussion of what are risk factors and how they play a role in the disease process.  Review of Cholesterol, Blood Pressure, Diabetes, and BMI and the role they play in your overall health.   Sleep Hygiene: -Provides group verbal and written instruction about how sleep can affect your health.  Define sleep hygiene, discuss sleep cycles and impact of sleep habits. Review good sleep hygiene tips.    Other: -Provides group and verbal instruction on various topics (see comments)   Knowledge Questionnaire Score: Knowledge Questionnaire Score - 07/18/19 1438      Knowledge Questionnaire Score   Pre Score  22/26 HF exercise nutrition       Core Components/Risk Factors/Patient Goals at Admission: Personal Goals and Risk Factors at Admission - 07/18/19 1442      Core Components/Risk Factors/Patient Goals on Admission    Weight Management  Yes;Weight Loss    Intervention  Weight Management: Develop a combined nutrition and exercise program designed to reach desired caloric intake, while maintaining appropriate intake of nutrient and fiber, sodium and fats, and appropriate energy expenditure required for the weight goal.;Weight Management: Provide education and appropriate resources to help participant work on and attain dietary goals.    Admit Weight  188 lb 6.4 oz (85.5 kg)    Goal Weight: Short Term  180 lb (81.6 kg)    Goal Weight: Long Term  175 lb (79.4 kg)    Expected Outcomes  Short Term: Continue to assess and modify interventions until short term weight is achieved;Long Term: Adherence to nutrition and physical  activity/exercise program aimed toward attainment of established weight goal;Weight Loss: Understanding of general recommendations for a balanced deficit meal plan, which promotes 1-2 lb weight loss per week and includes a negative energy balance of [phone] kcal/d;Understanding recommendations for meals to include 15-35% energy as protein, 25-35% energy from fat, 35-60% energy from carbohydrates, less than '200mg'$  of dietary cholesterol, 20-35 gm of total fiber daily;Understanding of distribution of calorie intake throughout the day with the consumption of 4-5 meals/snacks    Intervention  Provide education on lifestyle modifcations including regular physical activity/exercise, weight management, moderate sodium restriction and increased consumption of fresh fruit, vegetables, and low fat dairy, alcohol moderation, and smoking cessation.;Monitor prescription use compliance.    Expected Outcomes  Short Term: Continued assessment and intervention until BP is < 140/67m HG in hypertensive participants. < 130/831mHG in hypertensive participants with diabetes, heart failure or chronic kidney disease.;Long Term: Maintenance of blood pressure at goal levels.       Core Components/Risk Factors/Patient Goals Review:  Goals and Risk Factor Review    Row Name 09/02/19 1717 09/23/19 1724 10/28/19 1713         Core Components/Risk Factors/Patient Goals Review   Personal Goals Review  Weight Management/Obesity;Hypertension;Lipids  -  Weight Management/Obesity;Hypertension;Lipids     Review  Tamara Villegas lost 8 lbs  since starting cardiac rehab, changing diet, and exercising. She continues to take all meds as prescribed to contol blood pressure and lipids. Her BP meds were recently adjusted, which has helped improve lightheadedness. BP and lipids are reported to be in acceptable ranges.  Tamara Villegas has continues to lose weight - total of 11 lb so far.  She is taking all meds as directed.  Tamara Villegas is exercising at home  3 days a week  Tamara Villegas  reported that while Cardiac Rehab classes were cut back due to Covid restrictions she started to gain some weight, but it is starting to come back down now that she is exercising more regularly. She reports taking all meds as prescribed to control risk facotors.     Expected Outcomes  Short: continue weight loss, short term weight goal 175 lbs. Long: Continue to maintain heart healthy lifestyle to control risk factors. Longer term weight goal 170 lbs.  Short - continue exercise and healthy eating Long: Reach goal weight  Short - continue exercise and healthy eating Long: Reach goal weight        Core Components/Risk Factors/Patient Goals at Discharge (Final Review):  Goals and Risk Factor Review - 10/28/19 1713      Core Components/Risk Factors/Patient Goals Review   Personal Goals Review  Weight Management/Obesity;Hypertension;Lipids    Review  Tamara Villegas reported that while Cardiac Rehab classes were cut back due to Covid restrictions she started to gain some weight, but it is starting to come back down now that she is exercising more regularly. She reports taking all meds as prescribed to control risk facotors.    Expected Outcomes  Short - continue exercise and healthy eating Long: Reach goal weight       ITP Comments: ITP Comments    Row Name 07/10/19 1139 07/24/19 1213 07/25/19 1119 08/05/19 1651 08/14/19 0958   ITP Comments  Completed virtutal orientation.  Documentation for diagnosis can be found in [facility] encounter 06/12/19.  EP eval scheduled for Thursday 11/5.  Pt scheduled for holter monitor with follow up on 11/17.  Will check then to see when to start rehab officially.  Completed Initial RD eval  First full day of exercise!  Patient was oriented to gym and equipment including functions, settings, policies, and procedures.  Patient's individual exercise prescription and treatment plan were reviewed.  All starting workloads were established based on the results of the 6 minute walk test done at  initial orientation visit.  The plan for exercise progression was also introduced and progression will be customized based on patient's performance and goals.  30 day review competed . ITP sent to Dr Emily Filbert for review, changes as needed and ITP approval signature.  New to program   Row Name 08/21/19 1711 09/11/19 0842 09/16/19 1711 10/09/19 1129 10/14/19 1629   ITP Comments  Reviewed home exercise with pt today.  Pt plans to use home equipment for exercise.  Reviewed THR, pulse, RPE, sign and symptoms, NTG use, and when to call 911 or MD.  Also discussed weather considerations and indoor options.  Pt voiced understanding.  30 day review competed . ITP sent to Dr Emily Filbert for review, changes as needed and ITP approval signature New to program  Virtual follow up completed. No in person class this week. Tamara Villegas is doing well. She purchased some exercise equipment and is trying to get used to it. She is working on staying active while not in class. No other concerns noted.  30 day review  completed. ITP sent to Dr. Emily Filbert, Medical Director of Cardiac and Pulmonary Rehab. Continue with ITP unless changes are made by physician.  Department operating under reduced schedule until further notice by request from hospital leadership.  Follow up call completed. Tamara Villegas is doing well, staying active. She is looking forward to coming next week to Cardiac Rehab.   Tamara Villegas Name 11/06/19 0628           ITP Comments  30 day chart review completed. ITP sent to Dr Zachery Dakins Medical Director, for review,changes as needed and signature.          Comments:

## 2019-11-07 ENCOUNTER — Other Ambulatory Visit: Payer: Self-pay

## 2019-11-07 ENCOUNTER — Encounter: Payer: BC Managed Care – PPO | Admitting: *Deleted

## 2019-11-07 DIAGNOSIS — Z955 Presence of coronary angioplasty implant and graft: Secondary | ICD-10-CM | POA: Diagnosis not present

## 2019-11-07 NOTE — Progress Notes (Signed)
Daily Session Note  Patient Details  Name: Tamara Villegas MRN: [id number] Date of Birth: August 05, 1946 Referring Provider:     Cardiac Rehab from 07/18/2019 in [facility] Cardiac and Pulmonary Rehab  Referring Provider  Paraschos      Encounter Date: 11/07/2019  Check In: Session Check In - 11/07/19 1717      Check-In   Supervising physician immediately available to respond to emergencies  See telemetry face sheet for immediately available ER MD    Location  [facility]-Cardiac & Pulmonary Rehab    Staff Present  Heath Lark, RN, BSN, CCRP;Meredith Sherryll Burger, RN Moises Blood, BS, ACSM CEP, Exercise Physiologist    Virtual Visit  No    Medication changes reported      No    Fall or balance concerns reported     No    Warm-up and Cool-down  Performed on first and last piece of equipment    Resistance Training Performed  Yes    VAD Patient?  No    PAD/SET Patient?  No      Pain Assessment   Currently in Pain?  No/denies          Social History   Tobacco Use  Smoking Status Former Smoker  Smokeless Tobacco Never Used  Tobacco Comment   as teenager    Goals Met:  Independence with exercise equipment Exercise tolerated well No report of cardiac concerns or symptoms  Goals Unmet:  Not Applicable  Comments: Pt able to follow exercise prescription today without complaint.  Will continue to monitor for progression.    Dr. Emily Filbert is Medical Director for [place] and [facility].

## 2019-11-11 ENCOUNTER — Encounter: Payer: BC Managed Care – PPO | Attending: Cardiology | Admitting: *Deleted

## 2019-11-11 ENCOUNTER — Other Ambulatory Visit: Payer: Self-pay

## 2019-11-11 DIAGNOSIS — Z79899 Other long term (current) drug therapy: Secondary | ICD-10-CM | POA: Diagnosis not present

## 2019-11-11 DIAGNOSIS — I10 Essential (primary) hypertension: Secondary | ICD-10-CM | POA: Insufficient documentation

## 2019-11-11 DIAGNOSIS — Z955 Presence of coronary angioplasty implant and graft: Secondary | ICD-10-CM | POA: Insufficient documentation

## 2019-11-11 DIAGNOSIS — E7800 Pure hypercholesterolemia, unspecified: Secondary | ICD-10-CM | POA: Diagnosis not present

## 2019-11-11 DIAGNOSIS — Z87891 Personal history of nicotine dependence: Secondary | ICD-10-CM | POA: Diagnosis not present

## 2019-11-11 DIAGNOSIS — Z7982 Long term (current) use of aspirin: Secondary | ICD-10-CM | POA: Diagnosis not present

## 2019-11-11 DIAGNOSIS — G629 Polyneuropathy, unspecified: Secondary | ICD-10-CM | POA: Diagnosis not present

## 2019-11-11 NOTE — Progress Notes (Signed)
Daily Session Note  Patient Details  Name: IRLENE CRUDUP MRN: [id number] Date of Birth: 05-27-1946 Referring Provider:     Cardiac Rehab from 07/18/2019 in [facility] Cardiac and Pulmonary Rehab  Referring Provider  Paraschos      Encounter Date: 11/11/2019  Check In: Session Check In - 11/11/19 1720      Check-In   Supervising physician immediately available to respond to emergencies  See telemetry face sheet for immediately available ER MD    Location  [facility]-Cardiac & Pulmonary Rehab    Staff Present  Renita Papa, RN Moises Blood, BS, ACSM CEP, Exercise Physiologist;Joseph Tessie Fass RCP,RRT,BSRT    Virtual Visit  No    Medication changes reported      No    Fall or balance concerns reported     No    Warm-up and Cool-down  Performed on first and last piece of equipment    Resistance Training Performed  Yes    VAD Patient?  No    PAD/SET Patient?  No      Pain Assessment   Currently in Pain?  No/denies          Social History   Tobacco Use  Smoking Status Former Smoker  Smokeless Tobacco Never Used  Tobacco Comment   as teenager    Goals Met:  Independence with exercise equipment Exercise tolerated well No report of cardiac concerns or symptoms Strength training completed today  Goals Unmet:  Not Applicable  Comments: Pt able to follow exercise prescription today without complaint.  Will continue to monitor for progression.    Dr. Emily Filbert is Medical Director for [place] and [facility].

## 2019-11-13 ENCOUNTER — Other Ambulatory Visit: Payer: Self-pay

## 2019-11-13 ENCOUNTER — Encounter: Payer: BC Managed Care – PPO | Admitting: *Deleted

## 2019-11-13 DIAGNOSIS — Z955 Presence of coronary angioplasty implant and graft: Secondary | ICD-10-CM

## 2019-11-13 NOTE — Progress Notes (Signed)
Daily Session Note  Patient Details  Name: CAMRIN LAPRE MRN: [id number] Date of Birth: 1946-05-02 Referring Provider:     Cardiac Rehab from 07/18/2019 in [facility] Cardiac and Pulmonary Rehab  Referring Provider  Paraschos      Encounter Date: 11/13/2019  Check In: Session Check In - 11/13/19 1708      Check-In   Supervising physician immediately available to respond to emergencies  See telemetry face sheet for immediately available ER MD    Location  [facility]-Cardiac & Pulmonary Rehab    Staff Present  Renita Papa, RN BSN;Melissa Caiola RDN, Rowe Pavy, BA, ACSM CEP, Exercise Physiologist    Virtual Visit  No    Medication changes reported      No    Fall or balance concerns reported     No    Warm-up and Cool-down  Performed on first and last piece of equipment    Resistance Training Performed  Yes    VAD Patient?  No    PAD/SET Patient?  No      Pain Assessment   Currently in Pain?  No/denies          Social History   Tobacco Use  Smoking Status Former Smoker  Smokeless Tobacco Never Used  Tobacco Comment   as teenager    Goals Met:  Independence with exercise equipment Exercise tolerated well No report of cardiac concerns or symptoms Strength training completed today  Goals Unmet:  Not Applicable  Comments: Pt able to follow exercise prescription today without complaint.  Will continue to monitor for progression.    Dr. Emily Filbert is Medical Director for [place] and [facility].

## 2019-11-18 ENCOUNTER — Other Ambulatory Visit: Payer: Self-pay

## 2019-11-18 ENCOUNTER — Encounter: Payer: BC Managed Care – PPO | Admitting: *Deleted

## 2019-11-18 DIAGNOSIS — Z955 Presence of coronary angioplasty implant and graft: Secondary | ICD-10-CM

## 2019-11-18 NOTE — Progress Notes (Signed)
Daily Session Note  Patient Details  Name: Tamara Villegas MRN: [id number] Date of Birth: 1946/02/09 Referring Provider:     Cardiac Rehab from 07/18/2019 in [facility] Cardiac and Pulmonary Rehab  Referring Provider  Paraschos      Encounter Date: 11/18/2019  Check In: Session Check In - 11/18/19 1703      Check-In   Supervising physician immediately available to respond to emergencies  See telemetry face sheet for immediately available ER MD    Location  [facility]-Cardiac & Pulmonary Rehab    Staff Present  Renita Papa, RN Moises Blood, BS, ACSM CEP, Exercise Physiologist;Joseph Tessie Fass RCP,RRT,BSRT    Virtual Visit  No    Medication changes reported      No    Fall or balance concerns reported     No    Warm-up and Cool-down  Performed on first and last piece of equipment    Resistance Training Performed  Yes    VAD Patient?  No    PAD/SET Patient?  No      Pain Assessment   Currently in Pain?  No/denies          Social History   Tobacco Use  Smoking Status Former Smoker  Smokeless Tobacco Never Used  Tobacco Comment   as teenager    Goals Met:  Independence with exercise equipment Exercise tolerated well No report of cardiac concerns or symptoms Strength training completed today  Goals Unmet:  Not Applicable  Comments: Pt able to follow exercise prescription today without complaint.  Will continue to monitor for progression.    Dr. Emily Filbert is Medical Director for [place] and [facility].

## 2019-11-19 ENCOUNTER — Ambulatory Visit: Payer: BC Managed Care – PPO | Attending: Internal Medicine

## 2019-11-19 DIAGNOSIS — Z23 Encounter for immunization: Secondary | ICD-10-CM | POA: Insufficient documentation

## 2019-11-19 NOTE — Progress Notes (Signed)
   Covid-19 Vaccination Clinic  Name:  Tamara Villegas    MRN: [id number] DOB: Sep 07, 1946  11/19/2019  Tamara Villegas was observed post Covid-19 immunization for 30 minutes based on pre-vaccination screening without incident. She was provided with Vaccine Information Sheet and instruction to access the V-Safe system.   Tamara Villegas was instructed to call 911 with any severe reactions post vaccine: Marland Kitchen Difficulty breathing  . Swelling of face and throat  . A fast heartbeat  . A bad rash all over body  . Dizziness and weakness   Immunizations Administered    Name Date Dose VIS Date Route   Pfizer COVID-19 Vaccine 11/19/2019 11:48 AM 0.3 mL 08/23/2019 Intramuscular   Manufacturer: [place]   Lot: [id number]   [place]: [id number]

## 2019-11-27 ENCOUNTER — Other Ambulatory Visit: Payer: Self-pay

## 2019-11-27 ENCOUNTER — Encounter: Payer: BC Managed Care – PPO | Admitting: *Deleted

## 2019-11-27 DIAGNOSIS — Z955 Presence of coronary angioplasty implant and graft: Secondary | ICD-10-CM | POA: Diagnosis not present

## 2019-11-27 NOTE — Progress Notes (Signed)
Daily Session Note  Patient Details  Name: Tamara Villegas MRN: [id number] Date of Birth: 10/17/1945 Referring Provider:     Cardiac Rehab from 07/18/2019 in [facility] Cardiac and Pulmonary Rehab  Referring Provider  Paraschos      Encounter Date: 11/27/2019  Check In: Session Check In - 11/27/19 1709      Check-In   Supervising physician immediately available to respond to emergencies  See telemetry face sheet for immediately available ER MD    Location  [facility]-Cardiac & Pulmonary Rehab    Staff Present  Heath Lark, RN, BSN, CCRP;Meredith Sherryll Burger, RN BSN;Melissa Caiola RDN, LDN    Virtual Visit  No    Medication changes reported      No    Fall or balance concerns reported     No    Warm-up and Cool-down  Performed on first and last piece of equipment    Resistance Training Performed  Yes    VAD Patient?  No    PAD/SET Patient?  No      Pain Assessment   Currently in Pain?  No/denies          Social History   Tobacco Use  Smoking Status Former Smoker  Smokeless Tobacco Never Used  Tobacco Comment   as teenager    Goals Met:  Independence with exercise equipment Exercise tolerated well No report of cardiac concerns or symptoms  Goals Unmet:  Not Applicable  Comments: Pt able to follow exercise prescription today without complaint.  Will continue to monitor for progression.    Dr. Emily Filbert is Medical Director for [place] and [facility].

## 2019-11-28 DIAGNOSIS — Z955 Presence of coronary angioplasty implant and graft: Secondary | ICD-10-CM

## 2019-12-02 ENCOUNTER — Other Ambulatory Visit: Payer: Self-pay

## 2019-12-02 ENCOUNTER — Encounter: Payer: BC Managed Care – PPO | Admitting: *Deleted

## 2019-12-02 DIAGNOSIS — Z955 Presence of coronary angioplasty implant and graft: Secondary | ICD-10-CM | POA: Diagnosis not present

## 2019-12-02 NOTE — Progress Notes (Signed)
Daily Session Note  Patient Details  Name: Tamara Villegas MRN: [id number] Date of Birth: [birth year] Referring Provider:     Cardiac Rehab from 07/18/2019 in [facility]  Referring Provider  Paraschos      Encounter Date: 12/02/2019  Check In: Session Check In - 12/02/19 1710      Check-In   Supervising physician immediately available to respond to emergencies  See telemetry face sheet for immediately available ER MD    Location  [facility]-Cardiac & Pulmonary Rehab    Staff Present  Earlean Shawl, BS, ACSM CEP, Exercise Physiologist;Aishani Kalis Sherryll Burger, RN BSN;Joseph Hood RCP,RRT,BSRT;Carroll Enterkin, RN, [employer], CCRP    Virtual Visit  No    Medication changes reported      No    Fall or balance concerns reported     No    Warm-up and Cool-down  Performed on first and last piece of equipment    Resistance Training Performed  Yes    VAD Patient?  No    PAD/SET Patient?  No      Pain Assessment   Currently in Pain?  No/denies          Social History   Tobacco Use  Smoking Status Former Smoker  Smokeless Tobacco Never Used  Tobacco Comment   as teenager    Goals Met:  Independence with exercise equipment Exercise tolerated well No report of cardiac concerns or symptoms Strength training completed today  Goals Unmet:  Not Applicable  Comments: Pt able to follow exercise prescription today without complaint.  Will continue to monitor for progression.    Dr. Emily Filbert is Medical Director for [facility].

## 2019-12-04 ENCOUNTER — Other Ambulatory Visit: Payer: Self-pay

## 2019-12-04 ENCOUNTER — Encounter: Payer: BC Managed Care – PPO | Admitting: *Deleted

## 2019-12-04 ENCOUNTER — Encounter: Payer: Self-pay | Admitting: *Deleted

## 2019-12-04 DIAGNOSIS — Z955 Presence of coronary angioplasty implant and graft: Secondary | ICD-10-CM

## 2019-12-04 NOTE — Progress Notes (Signed)
Daily Session Note  Patient Details  Name: Tamara Villegas MRN: [id number] Date of Birth: [birth year] Referring Provider:     Cardiac Rehab from 07/18/2019 in [facility]  Referring Provider  Paraschos      Encounter Date: 12/04/2019  Check In: Session Check In - 12/04/19 1734      Check-In   Supervising physician immediately available to respond to emergencies  See telemetry face sheet for immediately available ER MD    Location  [facility]    Staff Present  Heath Lark, RN, BSN, CCRP;Melissa Caiola RDN, LDN;Meredith Sherryll Burger, RN BSN    Virtual Visit  No    Medication changes reported      No    Fall or balance concerns reported     No    Warm-up and Cool-down  Performed on first and last piece of equipment    Resistance Training Performed  Yes    VAD Patient?  No    PAD/SET Patient?  No      Pain Assessment   Currently in Pain?  No/denies          Social History   Tobacco Use  Smoking Status Former Smoker  Smokeless Tobacco Never Used  Tobacco Comment   as teenager    Goals Met:  Independence with exercise equipment Exercise tolerated well No report of cardiac concerns or symptoms  Goals Unmet:  Not Applicable  Comments: Pt able to follow exercise prescription today without complaint.  Will continue to monitor for progression.    Dr. Emily Filbert is Medical Director for [facility].

## 2019-12-04 NOTE — Progress Notes (Signed)
Cardiac Individual Treatment Plan  Patient Details  Name: Tamara Villegas MRN: [id number] Date of Birth: [birth year] Referring Provider:     Cardiac Rehab from 07/18/2019 in [facility] Cardiac [facility] Pul[facility] Re[facility]  Referring Provider  Paraschos      Initial Encounter Date:    Cardiac Rehab [facility] 07/18/[facility]  Date  07/18/19      Visit Diagnosis: S/P coronary artery stent placement  Patient's Home Medications on Admission:  Current Outpatient Medications:  .  acetaminophen (TYLENOL) 325 MG tablet, Take 650 mg by mouth every 6 (six) hours as needed for moderate pain or headache., Disp: , Rfl:  .  aspirin 81 MG tablet, Take 81 mg by mouth daily., Disp: , Rfl:  .  Calcium Carb-Cholecalciferol (CALCIUM 600 + D PO), Take 1 tablet by mouth 2 (two) times daily., Disp: , Rfl:  .  DULoxetine (CYMBALTA) 30 MG capsule, Take 30 mg by mouth daily., Disp: , Rfl:  .  latanoprost (XALATAN) 0.005 % ophthalmic solution, Place 1 drop into both eyes at bedtime., Disp: , Rfl:  .  lisinopril (ZESTRIL) 20 MG tablet, Take 20 mg by mouth daily., Disp: , Rfl:  .  lisinopril-hydrochlorothiazide (ZESTORETIC) 20-12.5 MG tablet, Take 1 tablet by mouth daily., Disp: , Rfl:  .  Multiple Vitamin (MULTIVITAMIN) tablet, Take 1 tablet by mouth daily., Disp: , Rfl:  .  Omega-3 Fatty Acids (FISH OIL) 1000 MG CAPS, Take 1,000-2,000 mg by mouth See admin instructions. Take 1000 mg in the morning and 2000 mg at night, Disp: , Rfl:  .  simvastatin (ZOCOR) 40 MG tablet, Take 40 mg by mouth at bedtime., Disp: , Rfl:  .  ticagrelor (BRILINTA) 90 MG TABS tablet, Take 1 tablet (90 mg total) by mouth 2 (two) times daily., Disp: 60 tablet, Rfl: 11 .  timolol (BETIMOL) 0.5 % ophthalmic solution, Place 1 drop into both eyes daily., Disp: , Rfl:   Past Medical History: Past Medical History:  Diagnosis Date  . Hepatitis 1993   B.   . Hypercholesteremia   . Hypertension   . Neuropathy    feet  . Wears  dentures    full upper, partial lower    Tobacco Use: Social History   Tobacco Use  Smoking Status Former Smoker  Smokeless Tobacco Never Used  Tobacco Comment   as teenager    Labs: Recent Review Flowsheet Data    There is no flowsheet data to display.       Exercise Target Goals: Exercise Program Goal: Individual exercise prescription set using results from initial 6 min walk test and THRR while considering  patient's activity barriers and safety.   Exercise Prescription Goal: Initial exercise prescription builds to 30-45 minutes a day of aerobic activity, 2-3 days per week.  Home exercise guidelines will be given to patient during program as part of exercise prescription that the participant will acknowledge.  Activity Barriers & Risk Stratification: Activity Barriers & Cardiac Risk Stratification - 07/10/19 1119      Activity Barriers & Cardiac Risk Stratification   Activity Barriers  Deconditioning;Other (comment);Balance Concerns;Muscular Weakness;History of Falls    Comments  nueropathy in both feet, twisted left ankle in August (Still bothering her), dizzy spells recently    Cardiac Risk Stratification  Moderate       6 Minute Walk: 6 Minute Walk    Row Name 07/18/19 1428         6 Minute Walk   Phase  Initial     Distance  1200 feet     Walk Time  6 minutes        Oxygen Initial Assessment:   Oxygen Re-Evaluation:   Oxygen Discharge (Final Oxygen Re-Evaluation):   Initial Exercise Prescription: Initial Exercise Prescription - 07/18/19 1400      Date of Initial Exercise RX and Referring Provider   Date  07/18/19    Referring Provider  Paraschos      Treadmill   MPH  2    Grade  0.5    Minutes  15    METs  2.6      Recumbant Bike   Level  2    RPM  60    Watts  18    Minutes  15    METs  2.6      NuStep   Level  2    SPM  80    Minutes  15    METs  2.6      REL-XR   Level  2    Speed  50    Minutes  15    METs  2.6       Biostep-RELP   Level  2    SPM  50    Minutes  15    METs  2.6      Prescription Details   Frequency (times per week)  3    Duration  Progress to 30 minutes of continuous aerobic without signs/symptoms of physical distress      Intensity   THRR 40-80% of Max Heartrate  98-130    Ratings of Perceived Exertion  11-15      Resistance Training   Training Prescription  Yes    Weight  4 lb    Reps  10-15       Perform Capillary Blood Glucose checks as needed.  Exercise Prescription Changes: Exercise Prescription Changes    Row Name 08/06/19 0800 08/21/19 1100 08/21/19 1700 09/03/19 1300 10/02/19 1300     Response to Exercise   Blood Pressure (Admit)  138/72  130/82  --  122/70  144/60   Blood Pressure (Exercise)  130/80  162/82  --  128/52  168/82   Blood Pressure (Exit)  130/68  118/62  --  122/56  110/62   Heart Rate (Admit)  84 bpm  92 bpm  --  85 bpm  90 bpm   Heart Rate (Exercise)  130 bpm  134 bpm  --  123 bpm  147 bpm   Heart Rate (Exit)  90 bpm  111 bpm  --  84 bpm  126 bpm   Rating of Perceived Exertion (Exercise)  --  15  --  15  15   Symptoms  none  none  --  none  none   Comments  first day   --  --  --  --   Duration  Continue with 30 min of aerobic exercise without signs/symptoms of physical distress.  Continue with 30 min of aerobic exercise without signs/symptoms of physical distress.  --  Continue with 30 min of aerobic exercise without signs/symptoms of physical distress.  Continue with 30 min of aerobic exercise without signs/symptoms of physical distress.   Intensity  THRR unchanged  THRR unchanged  --  THRR unchanged  THRR unchanged     Progression   Progression  Continue to progress workloads to maintain intensity without signs/symptoms of physical distress.  Continue to progress workloads to  maintain intensity without signs/symptoms of physical distress.  --  Continue to progress workloads to maintain intensity without signs/symptoms of physical distress.   Continue to progress workloads to maintain intensity without signs/symptoms of physical distress.   Average METs  2.5  2.82  --  3.7  3.48     Resistance Training   Training Prescription  Yes  Yes  --  Yes  Yes   Weight  4 lb  4 lb  --  4 lb  4 lb   Reps  10-15  10-15  --  10-15  10-15     Interval Training   Interval Training  No  No  --  No  No     Treadmill   MPH  2  2  --  --  2   Grade  0.5  1.5  --  --  3   Minutes  15  15  --  --  15   METs  2.6  2.94  --  --  3.36     NuStep   Level  2  2  --  3  3   SPM  80  --  --  80  80   Minutes  15  15  --  15  15   METs  2.4  2.5  --  3.7  3.6     Home Exercise Plan   Plans to continue exercise at  --  --  Home (comment) elliptical  Home (comment) elliptical  Home (comment) elliptical   Frequency  --  --  --  Add 1 additional day to program exercise sessions.  Add 1 additional day to program exercise sessions.   Initial Home Exercises Provided  --  --  --  08/21/19  08/21/19   Row Name 10/16/19 1000 10/29/19 1200 11/13/19 1100 11/26/19 1500       Response to Exercise   Blood Pressure (Admit)  124/64  148/58  122/62  146/72    Blood Pressure (Exercise)  130/70  166/74  142/82  144/82    Blood Pressure (Exit)  112/60  142/80  112/60  110/70    Heart Rate (Admit)  73 bpm  78 bpm  85 bpm  101 bpm    Heart Rate (Exercise)  133 bpm  145 bpm  136 bpm  136 bpm    Heart Rate (Exit)  91 bpm  107 bpm  125 bpm  121 bpm    Rating of Perceived Exertion (Exercise)  _0 Symptoms  none  none  none  none    Duration  Continue with 30 min of aerobic exercise without signs/symptoms of physical distress.  Continue with 30 min of aerobic exercise without signs/symptoms of physical distress.  Continue with 30 min of aerobic exercise without signs/symptoms of physical distress.  Continue with 30 min of aerobic exercise without signs/symptoms of physical distress.    Intensity  THRR unchanged  THRR unchanged  THRR unchanged  THRR unchanged       Progression   Progression  Continue to progress workloads to maintain intensity without signs/symptoms of physical distress.  Continue to progress workloads to maintain intensity without signs/symptoms of physical distress.  Continue to progress workloads to maintain intensity without signs/symptoms of physical distress.  Continue to progress workloads to maintain intensity without signs/symptoms of physical distress.    Average METs  2.63  3.45  3.2  3.7  Resistance Training   Training Prescription  Yes  Yes  Yes  Yes    Weight  4 lb   4lb   4lb  4 lb    Reps  10-15  10-15  10-15  10-15      Interval Training   Interval Training  No  No  No  No      Treadmill   MPH  2  --  2.3  2.3    Grade  3  --  3  3    Minutes  15  --  15  15    METs  3.36  --  3.71  3.71      NuStep   Level  --  4  4  --    SPM  --  80  --  --    Minutes  --  15  15  --    METs  --  3.9  3.9  --      REL-XR   Level  2  --  2.5  2.5    Minutes  15  --  15  15    METs  1.9  --  2.2  --      Biostep-RELP   Level  --  2  2  --    SPM  --  50  --  --    Minutes  --  15  15  --    METs  --  3  3  --      Home Exercise Plan   Plans to continue exercise at  Home (comment) elliptical  Home (comment) elliptical  Home (comment) elliptical  Home (comment) elliptical    Frequency  Add 1 additional day to program exercise sessions.  Add 1 additional day to program exercise sessions.  Add 2 additional days to program exercise sessions.  Add 2 additional days to program exercise sessions.    Initial Home Exercises Provided  08/21/19  08/21/19  08/21/19  08/21/19       Exercise Comments: Exercise Comments    Row Name 08/21/19 1711           Exercise Comments  Reviewed home exercise with pt today.  Pt plans to use home equipment for exercise.  Reviewed THR, pulse, RPE, sign and symptoms, NTG use, and when to call 911 or MD.  Also discussed weather considerations and indoor options.  Pt voiced  understanding.          Exercise Goals and Review: Exercise Goals    Row Name 07/18/19 1443             Exercise Goals   Increase Physical Activity  Yes       Intervention  Provide advice, education, support and counseling about physical activity/exercise needs.;Develop an individualized exercise prescription for aerobic and resistive training based on initial evaluation findings, risk stratification, comorbidities and participant's personal goals.       Expected Outcomes  Short Term: Attend rehab on a regular basis to increase amount of physical activity.;Long Term: Add in home exercise to make exercise part of routine and to increase amount of physical activity.;Long Term: Exercising regularly at least 3-5 days a week.       Increase Strength and Stamina  Yes       Intervention  Provide advice, education, support and counseling about physical activity/exercise needs.;Develop an individualized exercise prescription for aerobic and resistive training based on initial evaluation findings, risk stratification,  comorbidities and participant's personal goals.       Expected Outcomes  Short Term: Increase workloads from initial exercise prescription for resistance, speed, and METs.;Short Term: Perform resistance training exercises routinely during rehab and add in resistance training at home;Long Term: Improve cardiorespiratory fitness, muscular endurance and strength as measured by increased METs and functional capacity (6MWT)       Able to understand and use rate of perceived exertion (RPE) scale  Yes       Intervention  Provide education and explanation on how to use RPE scale       Expected Outcomes  Short Term: Able to use RPE daily in rehab to express subjective intensity level;Long Term:  Able to use RPE to guide intensity level when exercising independently       Knowledge and understanding of Target Heart Rate Range (THRR)  Yes       Intervention  Provide education and explanation of THRR  including how the numbers were predicted and where they are located for reference       Expected Outcomes  Short Term: Able to state/look up THRR;Short Term: Able to use daily as guideline for intensity in rehab;Long Term: Able to use THRR to govern intensity when exercising independently       Able to check pulse independently  Yes       Intervention  Provide education and demonstration on how to check pulse in carotid and radial arteries.;Review the importance of being able to check your own pulse for safety during independent exercise       Expected Outcomes  Short Term: Able to explain why pulse checking is important during independent exercise;Long Term: Able to check pulse independently and accurately       Understanding of Exercise Prescription  Yes       Intervention  Provide education, explanation, and written materials on patient's individual exercise prescription       Expected Outcomes  Short Term: Able to explain program exercise prescription;Long Term: Able to explain home exercise prescription to exercise independently          Exercise Goals Re-Evaluation : Exercise Goals Re-Evaluation    Row Name 08/05/19 1652 08/21/19 1117 09/02/19 1700 09/03/19 1310 09/20/19 0932     Exercise Goal Re-Evaluation   Exercise Goals Review  Increase Physical Activity;Able to understand and use rate of perceived exertion (RPE) scale;Knowledge and understanding of Target Heart Rate Range (THRR);Understanding of Exercise Prescription;Increase Strength and Stamina;Able to check pulse independently  Increase Physical Activity;Increase Strength and Stamina;Understanding of Exercise Prescription  Increase Physical Activity;Increase Strength and Stamina;Understanding of Exercise Prescription;Able to understand and use rate of perceived exertion (RPE) scale  Increase Physical Activity;Increase Strength and Stamina;Able to understand and use rate of perceived exertion (RPE) scale;Knowledge and understanding of  Target Heart Rate Range (THRR);Able to check pulse independently;Understanding of Exercise Prescription  --   Comments  Reviewed RPE scale, THR and program prescription with pt today.  Pt voiced understanding and was given a copy of goals to take home.  Marveline Profeta is doing well in rehab.  She has not had any symptoms since staring.  She is now up to 2.0 mph on the treadmill. We will continue to monitor her progress.  Makana has recently increased her NS level to 3 and her TM level to 3.0/3.0%. She reports her SOB, which was her main limiting factor, has improved and she is able to do more at home without limitations. She is able to walk  faster and reports less fatigue. Arlyce has an elliptical at home she plans to start using once cardiac rehab classes are cut back to do covid closure. She plans to exercise at home when she can not come into rehab. She was given our you tube channel information and plans to use those exercise and education videos as well.  --  Out since last review with the holidays   Expected Outcomes  Short: Use RPE daily to regulate intensity. Long: Follow program prescription in THR.  Short: Review home exercise guidelines.  Long: Continue to build stamina.  Short: Continue to attend cardiac rehab and exercise at home on off days. Long: maintain an independent exercise routine to help reduce cardiac risk factors.  --  --   Row Name 10/02/19 1315 10/16/19 1055 10/28/19 1710 11/13/19 1149 11/26/19 1544     Exercise Goal Re-Evaluation   Exercise Goals Review  Increase Physical Activity;Increase Strength and Stamina;Able to understand and use rate of perceived exertion (RPE) scale;Knowledge and understanding of Target Heart Rate Range (THRR);Able to check pulse independently;Understanding of Exercise Prescription  Increase Physical Activity;Increase Strength and Stamina;Understanding of Exercise Prescription  Increase Physical Activity;Increase Strength and Stamina;Understanding of Exercise Prescription   Increase Physical Activity;Increase Strength and Stamina;Understanding of Exercise Prescription  Increase Physical Activity;Increase Strength and Stamina;Able to understand and use rate of perceived exertion (RPE) scale;Able to understand and use Dyspnea scale;Knowledge and understanding of Target Heart Rate Range (THRR);Able to check pulse independently;Understanding of Exercise Prescription   Comments  Juliahna has increased incline on TM.  She uses 4 lb weights for strength work.  Staff will monitor progress.  Refugia continues to do well in rehab.  She is now up to level 2 on the XR.  We will continue to monitor her progress.  Jiayi continues to attend Cardiac Rehab 2 days per week and exercises at home one extra day outside of class. Encouraged her to add 2 days of home exercise outside of class for a total of 4 days a week. Brenetta continues to progress with workloads and gaining strength and stamina.  Ivalee Strauser has been doing well in rehab.  She is excited to get back to three days a week this week in class.  She is now up to 2.3 mph on the treadmill. We will continue to monitor her progress.  Keora has missed a couple days due to a work conflict.She plans to return ASAP.   Expected Outcomes  Short :  continue to exercise consistently Long : maintain exercise on her own  Short: Continue to attend class regularly as we transition back to schedule.  Long: Continue to improve stamina.  Short: continue to attend Cardiac Rehab classes and add 1-2 days of exercise at home. Long: Become independent with exercise program.  Short: Increase REL workload  Long; Continue to improve stamina.  Short : get back to consistent attendance Long:  improve stamina      Discharge Exercise Prescription (Final Exercise Prescription Changes): Exercise Prescription Changes - 11/26/19 1500      Response to Exercise   Blood Pressure (Admit)  146/72    Blood Pressure (Exercise)  144/82    Blood Pressure (Exit)  110/70    Heart Rate (Admit)  101 bpm     Heart Rate (Exercise)  136 bpm    Heart Rate (Exit)  121 bpm    Rating of Perceived Exertion (Exercise)  15    Symptoms  none    Duration  Continue  with 30 min of aerobic exercise without signs/symptoms of physical distress.    Intensity  THRR unchanged      Progression   Progression  Continue to progress workloads to maintain intensity without signs/symptoms of physical distress.    Average METs  3.7      Resistance Training   Training Prescription  Yes    Weight  4 lb    Reps  10-15      Interval Training   Interval Training  No      Treadmill   MPH  2.3    Grade  3    Minutes  15    METs  3.71      REL-XR   Level  2.5    Minutes  15      Home Exercise Plan   Plans to continue exercise at  Home (comment)   elliptical   Frequency  Add 2 additional days to program exercise sessions.    Initial Home Exercises Provided  08/21/19       Nutrition:  Target Goals: Understanding of nutrition guidelines, daily intake of sodium <1543m, cholesterol <2044m calories 30% from fat and 7% or less from saturated fats, daily to have 5 or more servings of fruits and vegetables.  Biometrics:    Nutrition Therapy Plan and Nutrition Goals: Nutrition Therapy & Goals - 07/25/19 1100      Nutrition Therapy   Diet  low Na, HH    Protein (specify units)  65g    Fiber  25 grams    Whole Grain Foods  3 servings    Saturated Fats  12 max. grams    Fruits and Vegetables  5 servings/day    Sodium  1.5 grams      Personal Nutrition Goals   Nutrition Goal  ST: switch to whole grain bread LT: get in better shape    Comments  B: V8 can  grits and water L: sandwich w/ water or tea D: cheese and crackers or popcorn. weekend: mePolandr chMongoliaroast or potatoes. Office: sausage biscuit at [employer]. [employer]/ water L: sometimes no lunch bring egg salad sandwich, bolonga and mayo sandwich, or out chick-fil-a. 3x 20 oz water/day. banana, apples, grapes, sweet potatoes, broccoli, salads, pinto or  baked beans. Pt reports liking tuna fish in water. Discussed HH eating.      Intervention Plan   Intervention  Prescribe, educate and counsel regarding individualized specific dietary modifications aiming towards targeted core components such as weight, hypertension, lipid management, diabetes, heart failure and other comorbidities.;Nutrition handout(s) given to patient.    Expected Outcomes  Short Term Goal: Understand basic principles of dietary content, such as calories, fat, sodium, cholesterol and nutrients.;Short Term Goal: A plan has been developed with personal nutrition goals set during dietitian appointment.;Long Term Goal: Adherence to prescribed nutrition plan.       Nutrition Assessments: Nutrition Assessments - 07/18/19 1418      MEDFICTS Scores   Pre Score  42       Nutrition Goals Re-Evaluation: Nutrition Goals Re-Evaluation    Row Name 08/19/19 1653 09/23/19 1719 10/28/19 1702 11/28/19 1710       Goals   Nutrition Goal  ST: review HH eating handout LT: get in better shape  ST: No new goals at this time Lt: get in better shape  ST: No new goals at this time Lt: get in better shape  ST: No new goals at this time Lt: get in better shape  Comment  switched to whole grain bread. Will review handout on HH eating and will make goals next re-eval.  Pt reports no new changes at this time and would not like to make any goals right now.  Pt reports no new changes at this time and would not like to make any goals right now.  Continue with current changes    Expected Outcome  ST: review HH eating handout LT: get in better shape  ST: No new goals at this time Lt: get in better shape  ST: No new goals at this time Lt: get in better shape  ST: No new goals at this time Lt: get in better shape       Nutrition Goals Discharge (Final Nutrition Goals Re-Evaluation): Nutrition Goals Re-Evaluation - 11/28/19 1710      Goals   Nutrition Goal  ST: No new goals at this time Lt: get in  better shape    Comment  Continue with current changes    Expected Outcome  ST: No new goals at this time Lt: get in better shape       Psychosocial: Target Goals: Acknowledge presence or absence of significant depression and/or stress, maximize coping skills, provide positive support system. Participant is able to verbalize types and ability to use techniques and skills needed for reducing stress and depression.   Initial Review & Psychosocial Screening: Initial Psych Review & Screening - 07/10/19 1112      Initial Review   Current issues with  Current Stress Concerns    Source of Stress Concerns  Chronic Illness;Unable to participate in former interests or hobbies;Unable to perform yard/household activities    Comments  Current stressor is health with having dizzy spells and risk of passing out.  She  has been lucky enought that they have not happened at work.  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      Family Dynamics   Good Support System?  Yes   Husband and daughter and granddaughter lives nearby     Barriers   Psychosocial barriers to participate in program  The patient should benefit from training in stress management and relaxation.;Psychosocial barriers identified (see note)      Screening Interventions   Interventions  Provide feedback about the scores to participant;To provide support and resources with identified psychosocial needs;Encouraged to exercise    Expected Outcomes  Short Term goal: Utilizing psychosocial counselor, staff and physician to assist with identification of specific Stressors or current issues interfering with healing process. Setting desired goal for each stressor or current issue identified.;Long Term Goal: Stressors or current issues are controlled or eliminated.;Short Term goal: Identification and review with participant of any Quality of Life or Depression concerns found by scoring the questionnaire.;Long Term goal: The  participant improves quality of Life and PHQ9 Scores as seen by post scores and/or verbalization of changes       Quality of Life Scores:  Quality of Life - 07/18/19 1437      Quality of Life   Select  Quality of Life      Quality of Life Scores   Health/Function Pre  21.93 %    Socioeconomic Pre  28.75 %    Psych/Spiritual Pre  29.14 %    Family Pre  28.8 %    GLOBAL Pre  25.91 %      Scores of 19 and below usually indicate a poorer quality of life in these areas.  A difference  of  2-3 points is a clinically meaningful difference.  A difference of 2-3 points in the total score of the Quality of Life Index has been associated with significant improvement in overall quality of life, self-image, physical symptoms, and general health in studies assessing change in quality of life.  PHQ-9: Recent Review Flowsheet Data    Depression screen [facility] 2/9 07/18/2019   Decreased Interest 0   Down, Depressed, Hopeless 0   PHQ - 2 Score 0   Altered sleeping 0   Tired, decreased energy 3   Change in appetite 0   Feeling bad or failure about yourself  0   Trouble concentrating 0   Moving slowly or fidgety/restless 0   Suicidal thoughts 0   PHQ-9 Score 3   Difficult doing work/chores Not difficult at all     Interpretation of Total Score  Total Score Depression Severity:  1-4 = Minimal depression, 5-9 = Mild depression, 10-14 = Moderate depression, 15-19 = Moderately severe depression, 20-27 = Severe depression   Psychosocial Evaluation and Intervention: Psychosocial Evaluation - 07/10/19 1140      Psychosocial Evaluation & Interventions   Interventions  Stress management education;Encouraged to exercise with the program and follow exercise prescription    Comments  Urania is doing well.  She has a strong support system. Her biggest stressors are her current health state as they try to solve her dizzy spells.  She is looking forward to exercise.  She also has a history of neuropathy in both  feet that only limits her occassionally.  She has a granddaughter that lives near by but hasn't seen since March due to [place].  She also has a leaky valve that they are continuing to monitor.  She will need the 5pm time slot due to work.    Expected Outcomes  Short: Attend rehab regularly around work schedule.  Long: Continue to cope positively.    Continue Psychosocial Services   Follow up required by staff       Psychosocial Re-Evaluation: Psychosocial Re-Evaluation    [place] Name 09/02/19 1713 09/23/19 1727 10/28/19 1715 11/18/19 1716       Psychosocial Re-Evaluation   Current issues with  Current Stress Concerns  Current Sleep Concerns;Current Stress Concerns  Current Sleep Concerns;Current Stress Concerns  --    Comments  Huberta reports no new stress concerns. She still deals with occational dizzy spells but this has improved since her doctor has adjusted her blood pressure medication.  Lorieann has no new stress concerns.  She sleeps well most of the time.  Lillieann has no new stress concerns.  She sleeps well most of the time.  Krysti reports no new stress concerns.  She walks at work when she gets stressed out.  She sleeps well.    Expected Outcomes  Short: Attend [facility] stress management education to decrease stress. Long: Maintain exercise [facility] to keep stress at a minimum.  Short :  maintain positive outlook Long : manage stress long term  Short :  maintain positive outlook Long : manage stress long term  Short: continue to manage stress Long: maintain low stress levels    Interventions  Encouraged to attend Cardiac Rehabilitation for the exercise  --  Encouraged to attend Cardiac Rehabilitation for the exercise  --    Continue Psychosocial Services   Follow up required by staff  --  Follow up required by staff  --       Psychosocial Discharge (Final Psychosocial Re-Evaluation): Psychosocial  Re-Evaluation - 11/18/19 1716      Psychosocial Re-Evaluation   Comments  Graelyn reports no new stress  concerns.  She walks at work when she gets stressed out.  She sleeps well.    Expected Outcomes  Short: continue to manage stress Long: maintain low stress levels       Vocational Rehabilitation: Provide vocational rehab assistance to qualifying candidates.   Vocational Rehab Evaluation & Intervention: Vocational Rehab - 07/10/19 1118      Initial Vocational Rehab Evaluation & Intervention   Assessment shows need for Vocational Rehabilitation  No       Education: Education Goals: Education classes will be provided on a variety of topics geared toward better understanding of heart health and risk factor modification. Participant will state understanding/return demonstration of topics presented as noted by education test scores.  Learning Barriers/Preferences: Learning Barriers/Preferences - 07/10/19 1117      Learning Barriers/Preferences   Learning Barriers  Sight   glasses   Learning Preferences  Written Material;Verbal Instruction;Skilled Demonstration       Education Topics:  AED/CPR: - Group verbal and written instruction with the use of models to demonstrate the basic use of the AED with the basic ABC's of resuscitation.   General Nutrition Guidelines/Fats and Fiber: -Group instruction provided by verbal, written material, models and posters to present the general guidelines for heart healthy nutrition. Gives an explanation and review of dietary fats and fiber.   Controlling Sodium/Reading Food Labels: -Group verbal and written material supporting the discussion of sodium use in heart healthy nutrition. Review and explanation with models, verbal and written materials for utilization of the food label.   Exercise Physiology & General Exercise Guidelines: - Group verbal and written instruction with models to review the exercise physiology of the cardiovascular system and associated critical values. Provides general exercise guidelines with specific guidelines to those  with heart or lung disease.    Aerobic Exercise & Resistance Training: - Gives group verbal and written instruction on the various components of exercise. Focuses on aerobic and resistive training programs and the benefits of this training and how to safely progress through these programs..   Flexibility, Balance, Mind/Body Relaxation: Provides group verbal/written instruction on the benefits of flexibility and balance training, including mind/body exercise modes such as yoga, pilates and tai chi.  Demonstration and skill practice provided.   Cardiac Rehab from 08/15/2019 in [facility] Cardiac and Pulmonary Rehab  Date  08/15/19  Educator  AS  Instruction Review Code  1- Verbalizes Understanding      Stress and Anxiety: - Provides group verbal and written instruction about the health risks of elevated stress and causes of high stress.  Discuss the correlation between heart/lung disease and anxiety and treatment options. Review healthy ways to manage with stress and anxiety.   Depression: - Provides group verbal and written instruction on the correlation between heart/lung disease and depressed mood, treatment options, and the stigmas associated with seeking treatment.   Anatomy & Physiology of the Heart: - Group verbal and written instruction and models provide basic cardiac anatomy and physiology, with the coronary electrical and arterial systems. Review of Valvular disease and Heart Failure   Cardiac Procedures: - Group verbal and written instruction to review commonly prescribed medications for heart disease. Reviews the medication, class of the drug, and side effects. Includes the steps to properly store meds and maintain the prescription regimen. (beta blockers and nitrates)   Cardiac Medications I: - Group verbal and written instruction  to review commonly prescribed medications for heart disease. Reviews the medication, class of the drug, and side effects. Includes the steps to  properly store meds and maintain the prescription regimen.   Cardiac Medications II: -Group verbal and written instruction to review commonly prescribed medications for heart disease. Reviews the medication, class of the drug, and side effects. (all other drug classes)    Go Sex-Intimacy & Heart Disease, Get SMART - Goal Setting: - Group verbal and written instruction through game format to discuss heart disease and the return to sexual intimacy. Provides group verbal and written material to discuss and apply goal setting through the application of the S.M.A.R.T. Method.   Other Matters of the Heart: - Provides group verbal, written materials and models to describe Stable Angina and Peripheral Artery. Includes description of the disease process and treatment options available to the cardiac patient.   Exercise & Equipment Safety: - Individual verbal instruction and demonstration of equipment use and safety with use of the equipment.   Cardiac Rehab from 08/15/2019 in [facility] Cardiac and Pulmonary Rehab  Date  07/18/19  Educator  AS  Instruction Review Code  1- Verbalizes Understanding      Infection Prevention: - Provides verbal and written material to individual with discussion of infection control including proper hand washing and proper equipment cleaning during exercise session.   Cardiac Rehab from 08/15/2019 in [facility] Cardiac and Pulmonary Rehab  Date  07/18/19  Educator  AS  Instruction Review Code  1- Verbalizes Understanding      Falls Prevention: - Provides verbal and written material to individual with discussion of falls prevention and safety.   Cardiac Rehab from 08/15/2019 in [facility] Cardiac and Pulmonary Rehab  Date  07/18/19  Educator  AS  Instruction Review Code  1- Verbalizes Understanding      Diabetes: - Individual verbal and written instruction to review signs/symptoms of diabetes, desired ranges of glucose level fasting, after meals and with exercise.  Acknowledge that pre and post exercise glucose checks will be done for 3 sessions at entry of program.   Know Your Numbers and Risk Factors: -Group verbal and written instruction about important numbers in your health.  Discussion of what are risk factors and how they play a role in the disease process.  Review of Cholesterol, Blood Pressure, Diabetes, and BMI and the role they play in your overall health.   Sleep Hygiene: -Provides group verbal and written instruction about how sleep can affect your health.  Define sleep hygiene, discuss sleep cycles and impact of sleep habits. Review good sleep hygiene tips.    Other: -Provides group and verbal instruction on various topics (see comments)   Knowledge Questionnaire Score: Knowledge Questionnaire Score - 07/18/19 1438      Knowledge Questionnaire Score   Pre Score  22/26 HF exercise nutrition       Core Components/Risk Factors/Patient Goals at Admission: Personal Goals and Risk Factors at Admission - 07/18/19 1442      Core Components/Risk Factors/Patient Goals on Admission    Weight Management  Yes;Weight Loss    Intervention  Weight Management: Develop a combined nutrition and exercise program designed to reach desired caloric intake, while maintaining appropriate intake of nutrient and fiber, sodium and fats, and appropriate energy expenditure required for the weight goal.;Weight Management: Provide education and appropriate resources to help participant work on and attain dietary goals.    Admit Weight  188 lb 6.4 oz (85.5 kg)    Goal Weight: Short  Term  180 lb (81.6 kg)    Goal Weight: Long Term  175 lb (79.4 kg)    Expected Outcomes  Short Term: Continue to assess and modify interventions until short term weight is achieved;Long Term: Adherence to nutrition and physical activity/exercise program aimed toward attainment of established weight goal;Weight Loss: Understanding of general recommendations for a balanced deficit meal  plan, which promotes 1-2 lb weight loss per week and includes a negative energy balance of [phone] kcal/d;Understanding recommendations for meals to include 15-35% energy as protein, 25-35% energy from fat, 35-60% energy from carbohydrates, less than '200mg'$  of dietary cholesterol, 20-35 gm of total fiber daily;Understanding of distribution of calorie intake throughout the day with the consumption of 4-5 meals/snacks    Intervention  Provide education on lifestyle modifcations including regular physical activity/exercise, weight management, moderate sodium restriction and increased consumption of fresh fruit, vegetables, and low fat dairy, alcohol moderation, and smoking cessation.;Monitor prescription use compliance.    Expected Outcomes  Short Term: Continued assessment and intervention until BP is < 140/80m HG in hypertensive participants. < 130/866mHG in hypertensive participants with diabetes, heart failure or chronic kidney disease.;Long Term: Maintenance of blood pressure at goal levels.       Core Components/Risk Factors/Patient Goals Review:  Goals and Risk Factor Review    Row Name 09/02/19 1717 09/23/19 1724 10/28/19 1713 11/18/19 1714       Core Components/Risk Factors/Patient Goals Review   Personal Goals Review  Weight Management/Obesity;Hypertension;Lipids  --  Weight Management/Obesity;Hypertension;Lipids  --    Review  JoGulianaas lost 8 lbs since starting cardiac rehab, changing diet, and exercising. She continues to take all meds as prescribed to contol blood pressure and lipids. Her BP meds were recently adjusted, which has helped improve lightheadedness. BP and lipids are reported to be in acceptable ranges.  JoKisaas continues to lose weight - total of 11 lb so far.  She is taking all meds as directed.  JoMyoshas exercising at home  3 days a week  JoEtoyeported that while Cardiac Rehab classes were cut back due to Covid restrictions she started to gain some weight, but it is starting to come  back down now that she is exercising more regularly. She reports taking all meds as prescribed to control risk facotors.  JoRobens taking all meds as directed.  Her weight is still coming down.  She does exercise at home.    Expected Outcomes  Short: continue weight loss, short term weight goal 175 lbs. Long: Continue to maintain heart healthy lifestyle to control risk factors. Longer term weight goal 170 lbs.  Short - continue exercise and healthy eating Long: Reach goal weight  Short - continue exercise and healthy eating Long: Reach goal weight  Short : exercise consistently Long: reach goal weight       Core Components/Risk Factors/Patient Goals at Discharge (Final Review):  Goals and Risk Factor Review - 11/18/19 1714      Core Components/Risk Factors/Patient Goals Review   Review  JoLochlyns taking all meds as directed.  Her weight is still coming down.  She does exercise at home.    Expected Outcomes  Short : exercise consistently Long: reach goal weight       ITP Comments: ITP Comments    Row Name 07/10/19 1139 07/24/19 1213 07/25/19 1119 08/05/19 1651 08/14/19 0958   ITP Comments  Completed virtutal orientation.  [facility]  EP  eval scheduled for Thursday 11/5.  Pt scheduled for holter monitor with follow up on 11/17.  Will check then to see when to start rehab officially.  Completed Initial RD eval  First full day of exercise!  Patient was oriented to gym and equipment including functions, settings, policies, and procedures.  Patient's individual exercise prescription and treatment plan were reviewed.  All starting workloads were established based on the results of the 6 minute walk test done at initial orientation visit.  The plan for exercise progression was also introduced and progression will be customized based on patient's performance and goals.  30 day review competed . ITP sent to Dr Emily Filbert for review, changes as needed and ITP  approval signature.  New to program   Row Name 08/21/19 1711 09/11/19 0842 09/16/19 1711 10/09/19 1129 10/14/19 1629   ITP Comments  Reviewed home exercise with pt today.  Pt plans to use home equipment for exercise.  Reviewed THR, pulse, RPE, sign and symptoms, NTG use, and when to call 911 or MD.  Also discussed weather considerations and indoor options.  Pt voiced understanding.  30 day review competed . ITP sent to Dr Emily Filbert for review, changes as needed and ITP approval signature New to program  Virtual follow up completed. No in person class this week. Bobette is doing well. She purchased some exercise equipment and is trying to get used to it. She is working on staying active while not in class. No other concerns noted.  30 day review completed. ITP sent to Dr. Emily Filbert, Medical Director of Cardiac and Pulmonary Rehab. Continue with ITP unless changes are made by physician.  Department operating under reduced schedule until further notice by request from hospital leadership.  Follow up call completed. Angila is doing well, staying active. She is looking forward to coming next week to Cardiac Rehab.   [place] Name 11/06/19 [phone] 12/04/19 0620         ITP Comments  30 day chart review completed. ITP sent to Dr Zachery Dakins Medical Director, for review,changes as needed and signature.  30 day chart review completed. ITP sent to Dr Zachery Dakins Medical Director, for review,changes as needed and signature. Continue with ITP if no changes requested         Comments:

## 2019-12-09 ENCOUNTER — Encounter: Payer: BC Managed Care – PPO | Admitting: *Deleted

## 2019-12-09 ENCOUNTER — Other Ambulatory Visit: Payer: Self-pay

## 2019-12-09 DIAGNOSIS — Z955 Presence of coronary angioplasty implant and graft: Secondary | ICD-10-CM

## 2019-12-09 NOTE — Progress Notes (Signed)
Daily Session Note  Patient Details  Name: Tamara Villegas MRN: [id number] Date of Birth: March 21, 1946 Referring Provider:     Cardiac Rehab from 07/18/2019 in [facility] Cardiac and Pulmonary Rehab  Referring Provider  Paraschos      Encounter Date: 12/09/2019  Check In: Session Check In - 12/09/19 1709      Check-In   Supervising physician immediately available to respond to emergencies  See telemetry face sheet for immediately available ER MD    Location  [facility]-Cardiac & Pulmonary Rehab    Staff Present  Renita Papa, RN Moises Blood, BS, ACSM CEP, Exercise Physiologist;Joseph Tessie Fass RCP,RRT,BSRT    Virtual Visit  No    Medication changes reported      No    Fall or balance concerns reported     No    Warm-up and Cool-down  Performed on first and last piece of equipment    Resistance Training Performed  Yes    VAD Patient?  No    PAD/SET Patient?  No      Pain Assessment   Currently in Pain?  No/denies          Social History   Tobacco Use  Smoking Status Former Smoker  Smokeless Tobacco Never Used  Tobacco Comment   as teenager    Goals Met:  Independence with exercise equipment Exercise tolerated well No report of cardiac concerns or symptoms Strength training completed today  Goals Unmet:  Not Applicable  Comments: Pt able to follow exercise prescription today without complaint.  Will continue to monitor for progression.    Dr. Emily Filbert is Medical Director for [place] and [facility].

## 2019-12-11 ENCOUNTER — Encounter: Payer: BC Managed Care – PPO | Admitting: *Deleted

## 2019-12-11 ENCOUNTER — Other Ambulatory Visit: Payer: Self-pay

## 2019-12-11 DIAGNOSIS — Z955 Presence of coronary angioplasty implant and graft: Secondary | ICD-10-CM | POA: Diagnosis not present

## 2019-12-11 NOTE — Progress Notes (Signed)
Daily Session Note  Patient Details  Name: Tamara Villegas MRN: [id number] Date of Birth: 06-04-1946 Referring Provider:     Cardiac Rehab from 07/18/2019 in [facility] Cardiac and Pulmonary Rehab  Referring Provider  Paraschos      Encounter Date: 12/11/2019  Check In: Session Check In - 12/11/19 1757      Check-In   Staff Present  Nyoka Cowden, RN, BSN, MA;Meredith Sherryll Burger, RN BSN;Melissa Caiola RDN, LDN    Virtual Visit  No    Medication changes reported      No    Fall or balance concerns reported     No    Warm-up and Cool-down  Performed on first and last piece of equipment    Resistance Training Performed  Yes    VAD Patient?  No    PAD/SET Patient?  No      Pain Assessment   Currently in Pain?  No/denies          Social History   Tobacco Use  Smoking Status Former Smoker  Smokeless Tobacco Never Used  Tobacco Comment   as teenager    Goals Met:  Independence with exercise equipment Exercise tolerated well No report of cardiac concerns or symptoms  Goals Unmet:  O2 Sat  Pt able to follow exercise prescription today without complaint.  Will continue to monitor for progression.   Dr. Emily Filbert is Medical Director for [place] and [facility].

## 2019-12-12 ENCOUNTER — Encounter: Payer: BC Managed Care – PPO | Attending: Cardiology | Admitting: *Deleted

## 2019-12-12 ENCOUNTER — Other Ambulatory Visit: Payer: Self-pay

## 2019-12-12 DIAGNOSIS — Z87891 Personal history of nicotine dependence: Secondary | ICD-10-CM | POA: Diagnosis not present

## 2019-12-12 DIAGNOSIS — Z79899 Other long term (current) drug therapy: Secondary | ICD-10-CM | POA: Insufficient documentation

## 2019-12-12 DIAGNOSIS — E7800 Pure hypercholesterolemia, unspecified: Secondary | ICD-10-CM | POA: Insufficient documentation

## 2019-12-12 DIAGNOSIS — Z955 Presence of coronary angioplasty implant and graft: Secondary | ICD-10-CM | POA: Diagnosis present

## 2019-12-12 DIAGNOSIS — G629 Polyneuropathy, unspecified: Secondary | ICD-10-CM | POA: Diagnosis not present

## 2019-12-12 DIAGNOSIS — I10 Essential (primary) hypertension: Secondary | ICD-10-CM | POA: Insufficient documentation

## 2019-12-12 DIAGNOSIS — Z7982 Long term (current) use of aspirin: Secondary | ICD-10-CM | POA: Diagnosis not present

## 2019-12-12 NOTE — Progress Notes (Signed)
Daily Session Note  Patient Details  Name: Tamara Villegas MRN: [id number] Date of Birth: 07/03/1946 Referring Provider:     Cardiac Rehab from 07/18/2019 in [facility] Cardiac and Pulmonary Rehab  Referring Provider  Paraschos      Encounter Date: 12/12/2019  Check In: Session Check In - 12/12/19 [place]      Check-In   Supervising physician immediately available to respond to emergencies  See telemetry face sheet for immediately available ER MD    Location  [facility]-Cardiac & Pulmonary Rehab    Staff Present  Justin Mend Jaci Carrel, BS, ACSM CEP, Exercise Physiologist;Adanya Sosinski Sherryll Burger, RN BSN    Virtual Visit  No    Medication changes reported      No    Fall or balance concerns reported     No    Warm-up and Cool-down  Performed on first and last piece of equipment    Resistance Training Performed  Yes    VAD Patient?  No    PAD/SET Patient?  No      Pain Assessment   Currently in Pain?  No/denies          Social History   Tobacco Use  Smoking Status Former Smoker  Smokeless Tobacco Never Used  Tobacco Comment   as teenager    Goals Met:  Independence with exercise equipment Exercise tolerated well No report of cardiac concerns or symptoms Strength training completed today  Goals Unmet:  Not Applicable  Comments: Pt able to follow exercise prescription today without complaint.  Will continue to monitor for progression.    Dr. Emily Filbert is Medical Director for [place] and [facility].

## 2019-12-16 ENCOUNTER — Other Ambulatory Visit: Payer: Self-pay

## 2019-12-16 ENCOUNTER — Encounter: Payer: BC Managed Care – PPO | Admitting: *Deleted

## 2019-12-16 DIAGNOSIS — Z955 Presence of coronary angioplasty implant and graft: Secondary | ICD-10-CM

## 2019-12-16 NOTE — Progress Notes (Signed)
Daily Session Note  Patient Details  Name: Tamara Villegas MRN: [id number] Date of Birth: 04-Jun-1946 Referring Provider:     Cardiac Rehab from 07/18/2019 in [facility] Cardiac and Pulmonary Rehab  Referring Provider  Paraschos      Encounter Date: 12/16/2019  Check In: Session Check In - 12/16/19 1657      Check-In   Supervising physician immediately available to respond to emergencies  See telemetry face sheet for immediately available ER MD    Location  [facility]-Cardiac & Pulmonary Rehab    Staff Present  Renita Papa, RN Moises Blood, BS, ACSM CEP, Exercise Physiologist;Joseph Tessie Fass RCP,RRT,BSRT    Virtual Visit  No    Medication changes reported      No    Fall or balance concerns reported     No    Warm-up and Cool-down  Performed on first and last piece of equipment    Resistance Training Performed  Yes    VAD Patient?  No    PAD/SET Patient?  No      Pain Assessment   Currently in Pain?  No/denies          Social History   Tobacco Use  Smoking Status Former Smoker  Smokeless Tobacco Never Used  Tobacco Comment   as teenager    Goals Met:  Independence with exercise equipment Exercise tolerated well No report of cardiac concerns or symptoms Strength training completed today  Goals Unmet:  Not Applicable  Comments: Pt able to follow exercise prescription today without complaint.  Will continue to monitor for progression.    Dr. Emily Filbert is Medical Director for [place] and [facility].

## 2019-12-19 ENCOUNTER — Other Ambulatory Visit: Payer: Self-pay

## 2019-12-19 ENCOUNTER — Encounter: Payer: BC Managed Care – PPO | Admitting: *Deleted

## 2019-12-19 VITALS — Ht 65.5 in | Wt 179.9 lb

## 2019-12-19 DIAGNOSIS — Z955 Presence of coronary angioplasty implant and graft: Secondary | ICD-10-CM | POA: Diagnosis not present

## 2019-12-19 NOTE — Progress Notes (Signed)
Daily Session Note  Patient Details  Name: Tamara Villegas MRN: [id number] Date of Birth: [birth year] Referring Provider:     Cardiac Rehab from 07/18/2019 in [facility] Cardiac and Pulmonary Rehab  Referring Provider  Paraschos      Encounter Date: 12/19/2019  Check In: Session Check In - 12/19/19 1708      Check-In   Supervising physician immediately available to respond to emergencies  See telemetry face sheet for immediately available ER MD    Location  [facility]-Cardiac & Pulmonary Rehab    Staff Present  Renita Papa, RN Moises Blood, BS, ACSM CEP, Exercise Physiologist;Joseph Tessie Fass RCP,RRT,BSRT    Virtual Visit  No    Medication changes reported      No    Fall or balance concerns reported     No    Warm-up and Cool-down  Performed on first and last piece of equipment    Resistance Training Performed  Yes    VAD Patient?  No    PAD/SET Patient?  No      Pain Assessment   Currently in Pain?  No/denies          Social History   Tobacco Use  Smoking Status Former Smoker  Smokeless Tobacco Never Used  Tobacco Comment   as teenager    Goals Met:  Independence with exercise equipment Exercise tolerated well No report of cardiac concerns or symptoms Strength training completed today  Goals Unmet:  Not Applicable  Comments: Pt able to follow exercise prescription today without complaint.  Will continue to monitor for progression.  [place] Name 07/18/19 1428 07/18/19 1728 12/19/19 1724     6 Minute Walk   Phase  Initial  --  Discharge   Distance  1200 feet  1200 feet  1500 feet   Distance % Change  --  --  25 %   Distance Feet Change  --  --  300 ft   Walk Time  6 minutes  6 minutes  6 minutes   # of Rest Breaks  --  0  0   MPH  --  2.27  2.84   METS  --  2.68  3.71   RPE  --  17  15   Perceived Dyspnea   --  3  --   VO2 Peak  --  9.38  12.98   Symptoms  --  No  No   Resting HR  --  65 bpm  82 bpm   Resting BP  --  124/64  132/80   Max Ex. HR  --   111 bpm  130 bpm   Max Ex. BP  --  158/66  180/80   2 Minute Post BP  --  140/68  150/80        Dr. Emily Filbert is Medical Director for [place] and [facility].

## 2019-12-23 ENCOUNTER — Other Ambulatory Visit: Payer: Self-pay

## 2019-12-23 ENCOUNTER — Encounter: Payer: BC Managed Care – PPO | Admitting: *Deleted

## 2019-12-23 DIAGNOSIS — Z955 Presence of coronary angioplasty implant and graft: Secondary | ICD-10-CM

## 2019-12-23 NOTE — Progress Notes (Signed)
Daily Session Note  Patient Details  Name: CAMBREE HENDRIX MRN: [id number] Date of Birth: Jun 25, 1946 Referring Provider:     Cardiac Rehab from 07/18/2019 in [facility] Cardiac and Pulmonary Rehab  Referring Provider  Paraschos      Encounter Date: 12/23/2019  Check In: Session Check In - 12/23/19 1703      Check-In   Supervising physician immediately available to respond to emergencies  See telemetry face sheet for immediately available ER MD    Location  [facility]-Cardiac & Pulmonary Rehab    Staff Present  Renita Papa, RN Moises Blood, BS, ACSM CEP, Exercise Physiologist;Joseph Tessie Fass RCP,RRT,BSRT    Virtual Visit  No    Medication changes reported      No    Fall or balance concerns reported     No    Warm-up and Cool-down  Performed on first and last piece of equipment    Resistance Training Performed  Yes    VAD Patient?  No    PAD/SET Patient?  No      Pain Assessment   Currently in Pain?  No/denies          Social History   Tobacco Use  Smoking Status Former Smoker  Smokeless Tobacco Never Used  Tobacco Comment   as teenager    Goals Met:  Independence with exercise equipment Exercise tolerated well No report of cardiac concerns or symptoms Strength training completed today  Goals Unmet:  Not Applicable  Comments: Pt able to follow exercise prescription today without complaint.  Will continue to monitor for progression.    Dr. Emily Filbert is Medical Director for [place] and [facility].

## 2019-12-25 ENCOUNTER — Encounter: Payer: BC Managed Care – PPO | Admitting: *Deleted

## 2019-12-25 ENCOUNTER — Other Ambulatory Visit: Payer: Self-pay

## 2019-12-25 DIAGNOSIS — Z955 Presence of coronary angioplasty implant and graft: Secondary | ICD-10-CM

## 2019-12-25 NOTE — Progress Notes (Signed)
Daily Session Note  Patient Details  Name: Tamara Villegas MRN: [id number] Date of Birth: 11-20-1945 Referring Provider:     Cardiac Rehab from 07/18/2019 in [facility] Cardiac and Pulmonary Rehab  Referring Provider  Paraschos      Encounter Date: 12/25/2019  Check In: Session Check In - 12/25/19 1703      Check-In   Supervising physician immediately available to respond to emergencies  See telemetry face sheet for immediately available ER MD    Location  [facility]-Cardiac & Pulmonary Rehab    Staff Present  Renita Papa, RN BSN;Melissa Caiola RDN, LDN;Susanne Bice, RN, BSN, CCRP    Virtual Visit  No    Medication changes reported      No    Fall or balance concerns reported     No    Warm-up and Cool-down  Performed on first and last piece of equipment    Resistance Training Performed  Yes    VAD Patient?  No    PAD/SET Patient?  No      Pain Assessment   Currently in Pain?  No/denies          Social History   Tobacco Use  Smoking Status Former Smoker  Smokeless Tobacco Never Used  Tobacco Comment   as teenager    Goals Met:  Independence with exercise equipment Exercise tolerated well No report of cardiac concerns or symptoms Strength training completed today  Goals Unmet:  Not Applicable  Comments: Pt able to follow exercise prescription today without complaint.  Will continue to monitor for progression.    Dr. Emily Filbert is Medical Director for [place] and [facility].

## 2019-12-26 ENCOUNTER — Other Ambulatory Visit: Payer: Self-pay

## 2019-12-26 ENCOUNTER — Encounter: Payer: BC Managed Care – PPO | Admitting: *Deleted

## 2019-12-26 DIAGNOSIS — Z955 Presence of coronary angioplasty implant and graft: Secondary | ICD-10-CM | POA: Diagnosis not present

## 2019-12-26 NOTE — Progress Notes (Signed)
Daily Session Note  Patient Details  Name: Tamara Villegas MRN: [id number] Date of Birth: 1946/07/24 Referring Provider:     Cardiac Rehab from 07/18/2019 in [facility] Cardiac and Pulmonary Rehab  Referring Provider  Paraschos      Encounter Date: 12/26/2019  Check In: Session Check In - 12/26/19 1706      Check-In   Supervising physician immediately available to respond to emergencies  See telemetry face sheet for immediately available ER MD    Location  [facility]-Cardiac & Pulmonary Rehab    Staff Present  Renita Papa, RN BSN;Joseph Hood RCP,RRT,BSRT;Melissa Kaplan RDN, LDN    Virtual Visit  No    Medication changes reported      No    Fall or balance concerns reported     No    Warm-up and Cool-down  Performed on first and last piece of equipment    Resistance Training Performed  Yes    VAD Patient?  No    PAD/SET Patient?  No      Pain Assessment   Currently in Pain?  No/denies          Social History   Tobacco Use  Smoking Status Former Smoker  Smokeless Tobacco Never Used  Tobacco Comment   as teenager    Goals Met:  Independence with exercise equipment Exercise tolerated well No report of cardiac concerns or symptoms Strength training completed today  Goals Unmet:  Not Applicable  Comments: Pt able to follow exercise prescription today without complaint.  Will continue to monitor for progression.    Dr. Emily Filbert is Medical Director for [place] and [facility].

## 2019-12-30 ENCOUNTER — Encounter: Payer: BC Managed Care – PPO | Admitting: *Deleted

## 2019-12-30 ENCOUNTER — Other Ambulatory Visit: Payer: Self-pay

## 2019-12-30 DIAGNOSIS — Z955 Presence of coronary angioplasty implant and graft: Secondary | ICD-10-CM | POA: Diagnosis not present

## 2019-12-30 NOTE — Progress Notes (Signed)
Daily Session Note  Patient Details  Name: Tamara Villegas MRN: [id number] Date of Birth: 06/22/1946 Referring Provider:     Cardiac Rehab from 07/18/2019 in [facility] Cardiac and Pulmonary Rehab  Referring Provider  Paraschos      Encounter Date: 12/30/2019  Check In: Session Check In - 12/30/19 1705      Check-In   Supervising physician immediately available to respond to emergencies  See telemetry face sheet for immediately available ER MD    Location  [facility]-Cardiac & Pulmonary Rehab    Staff Present  Renita Papa, RN Moises Blood, BS, ACSM CEP, Exercise Physiologist;Joseph Tessie Fass RCP,RRT,BSRT    Virtual Visit  No    Medication changes reported      No    Fall or balance concerns reported     No    Warm-up and Cool-down  Performed on first and last piece of equipment    Resistance Training Performed  Yes    VAD Patient?  No    PAD/SET Patient?  No      Pain Assessment   Currently in Pain?  No/denies          Social History   Tobacco Use  Smoking Status Former Smoker  Smokeless Tobacco Never Used  Tobacco Comment   as teenager    Goals Met:  Independence with exercise equipment Exercise tolerated well No report of cardiac concerns or symptoms Strength training completed today  Goals Unmet:  Not Applicable  Comments: Pt able to follow exercise prescription today without complaint.  Will continue to monitor for progression.    Dr. Emily Filbert is Medical Director for [place] and [facility].

## 2020-01-01 ENCOUNTER — Encounter: Payer: Self-pay | Admitting: *Deleted

## 2020-01-01 ENCOUNTER — Encounter: Payer: BC Managed Care – PPO | Admitting: *Deleted

## 2020-01-01 ENCOUNTER — Other Ambulatory Visit: Payer: Self-pay

## 2020-01-01 DIAGNOSIS — Z955 Presence of coronary angioplasty implant and graft: Secondary | ICD-10-CM | POA: Diagnosis not present

## 2020-01-01 NOTE — Progress Notes (Signed)
Cardiac Individual Treatment Plan  Patient Details  Name: Tamara Villegas MRN: [id number] Date of Birth: 1945-09-28 Referring Provider:     Cardiac Rehab from 07/18/2019 in [facility] Cardiac and Pulmonary Rehab  Referring Provider  Paraschos      Initial Encounter Date:    Cardiac Rehab from 07/18/2019 in [facility] Cardiac and Pulmonary Rehab  Date  07/18/19      Visit Diagnosis: S/P coronary artery stent placement  Patient's Home Medications on Admission:  Current Outpatient Medications:  .  acetaminophen (TYLENOL) 325 MG tablet, Take 650 mg by mouth every 6 (six) hours as needed for moderate pain or headache., Disp: , Rfl:  .  aspirin 81 MG tablet, Take 81 mg by mouth daily., Disp: , Rfl:  .  Calcium Carb-Cholecalciferol (CALCIUM 600 + D PO), Take 1 tablet by mouth 2 (two) times daily., Disp: , Rfl:  .  DULoxetine (CYMBALTA) 30 MG capsule, Take 30 mg by mouth daily., Disp: , Rfl:  .  latanoprost (XALATAN) 0.005 % ophthalmic solution, Place 1 drop into both eyes at bedtime., Disp: , Rfl:  .  lisinopril (ZESTRIL) 20 MG tablet, Take 20 mg by mouth daily., Disp: , Rfl:  .  lisinopril-hydrochlorothiazide (ZESTORETIC) 20-12.5 MG tablet, Take 1 tablet by mouth daily., Disp: , Rfl:  .  Multiple Vitamin (MULTIVITAMIN) tablet, Take 1 tablet by mouth daily., Disp: , Rfl:  .  Omega-3 Fatty Acids (FISH OIL) 1000 MG CAPS, Take 1,000-2,000 mg by mouth See admin instructions. Take 1000 mg in the morning and 2000 mg at night, Disp: , Rfl:  .  simvastatin (ZOCOR) 40 MG tablet, Take 40 mg by mouth at bedtime., Disp: , Rfl:  .  ticagrelor (BRILINTA) 90 MG TABS tablet, Take 1 tablet (90 mg total) by mouth 2 (two) times daily., Disp: 60 tablet, Rfl: 11 .  timolol (BETIMOL) 0.5 % ophthalmic solution, Place 1 drop into both eyes daily., Disp: , Rfl:   Past Medical History: Past Medical History:  Diagnosis Date  . Hepatitis 1993   B.   . Hypercholesteremia   . Hypertension   . Neuropathy    feet  . Wears  dentures    full upper, partial lower    Tobacco Use: Social History   Tobacco Use  Smoking Status Former Smoker  Smokeless Tobacco Never Used  Tobacco Comment   as teenager    Labs: Recent Review Flowsheet Data    There is no flowsheet data to display.       Exercise Target Goals: Exercise Program Goal: Individual exercise prescription set using results from initial 6 min walk test and THRR while considering  patient's activity barriers and safety.   Exercise Prescription Goal: Initial exercise prescription builds to 30-45 minutes a day of aerobic activity, 2-3 days per week.  Home exercise guidelines will be given to patient during program as part of exercise prescription that the participant will acknowledge.   Education: Aerobic Exercise & Resistance Training: - Gives group verbal and written instruction on the various components of exercise. Focuses on aerobic and resistive training programs and the benefits of this training and how to safely progress through these programs..   Education: Exercise & Equipment Safety: - Individual verbal instruction and demonstration of equipment use and safety with use of the equipment.   Cardiac Rehab from 08/15/2019 in [facility] Cardiac and Pulmonary Rehab  Date  07/18/19  Educator  AS  Instruction Review Code  1- Verbalizes Understanding      Education:  Exercise Physiology & General Exercise Guidelines: - Group verbal and written instruction with models to review the exercise physiology of the cardiovascular system and associated critical values. Provides general exercise guidelines with specific guidelines to those with heart or lung disease.    Education: Flexibility, Balance, Mind/Body Relaxation: Provides group verbal/written instruction on the benefits of flexibility and balance training, including mind/body exercise modes such as yoga, pilates and tai chi.  Demonstration and skill practice provided.   Cardiac Rehab from  08/15/2019 in [facility] Cardiac and Pulmonary Rehab  Date  08/15/19  Educator  AS  Instruction Review Code  1- Verbalizes Understanding      Activity Barriers & Risk Stratification: Activity Barriers & Cardiac Risk Stratification - 07/10/19 1119      Activity Barriers & Cardiac Risk Stratification   Activity Barriers  Deconditioning;Other (comment);Balance Concerns;Muscular Weakness;History of Falls    Comments  nueropathy in both feet, twisted left ankle in August (Still bothering her), dizzy spells recently    Cardiac Risk Stratification  Moderate       6 Minute Walk: 6 Minute Walk    Row Name 07/18/19 1428 07/18/19 1728 12/19/19 1724     6 Minute Walk   Phase  Initial  --  Discharge   Distance  1200 feet  1200 feet  1500 feet   Distance % Change  --  --  25 %   Distance Feet Change  --  --  300 ft   Walk Time  6 minutes  6 minutes  6 minutes   # of Rest Breaks  --  0  0   MPH  --  2.27  2.84   METS  --  2.68  3.71   RPE  --  17  15   Perceived Dyspnea   --  3  --   VO2 Peak  --  9.38  12.98   Symptoms  --  No  No   Resting HR  --  65 bpm  82 bpm   Resting BP  --  124/64  132/80   Max Ex. HR  --  111 bpm  130 bpm   Max Ex. BP  --  158/66  180/80   2 Minute Post BP  --  140/68  150/80      Oxygen Initial Assessment:   Oxygen Re-Evaluation:   Oxygen Discharge (Final Oxygen Re-Evaluation):   Initial Exercise Prescription: Initial Exercise Prescription - 07/18/19 1400      Date of Initial Exercise RX and Referring Provider   Date  07/18/19    Referring Provider  Paraschos      Treadmill   MPH  2    Grade  0.5    Minutes  15    METs  2.6      Recumbant Bike   Level  2    RPM  60    Watts  18    Minutes  15    METs  2.6      NuStep   Level  2    SPM  80    Minutes  15    METs  2.6      REL-XR   Level  2    Speed  50    Minutes  15    METs  2.6      Biostep-RELP   Level  2    SPM  50    Minutes  15    METs  2.6  Prescription Details    Frequency (times per week)  3    Duration  Progress to 30 minutes of continuous aerobic without signs/symptoms of physical distress      Intensity   THRR 40-80% of Max Heartrate  98-130    Ratings of Perceived Exertion  11-15      Resistance Training   Training Prescription  Yes    Weight  4 lb    Reps  10-15       Perform Capillary Blood Glucose checks as needed.  Exercise Prescription Changes: Exercise Prescription Changes    Row Name 08/06/19 0800 08/21/19 1100 08/21/19 1700 09/03/19 1300 10/02/19 1300     Response to Exercise   Blood Pressure (Admit)  138/72  130/82  --  122/70  144/60   Blood Pressure (Exercise)  130/80  162/82  --  128/52  168/82   Blood Pressure (Exit)  130/68  118/62  --  122/56  110/62   Heart Rate (Admit)  84 bpm  92 bpm  --  85 bpm  90 bpm   Heart Rate (Exercise)  130 bpm  134 bpm  --  123 bpm  147 bpm   Heart Rate (Exit)  90 bpm  111 bpm  --  84 bpm  126 bpm   Rating of Perceived Exertion (Exercise)  --  15  --  15  15   Symptoms  none  none  --  none  none   Comments  first day   --  --  --  --   Duration  Continue with 30 min of aerobic exercise without signs/symptoms of physical distress.  Continue with 30 min of aerobic exercise without signs/symptoms of physical distress.  --  Continue with 30 min of aerobic exercise without signs/symptoms of physical distress.  Continue with 30 min of aerobic exercise without signs/symptoms of physical distress.   Intensity  THRR unchanged  THRR unchanged  --  THRR unchanged  THRR unchanged     Progression   Progression  Continue to progress workloads to maintain intensity without signs/symptoms of physical distress.  Continue to progress workloads to maintain intensity without signs/symptoms of physical distress.  --  Continue to progress workloads to maintain intensity without signs/symptoms of physical distress.  Continue to progress workloads to maintain intensity without signs/symptoms of physical distress.    Average METs  2.5  2.82  --  3.7  3.48     Resistance Training   Training Prescription  Yes  Yes  --  Yes  Yes   Weight  4 lb  4 lb  --  4 lb  4 lb   Reps  10-15  10-15  --  10-15  10-15     Interval Training   Interval Training  No  No  --  No  No     Treadmill   MPH  2  2  --  --  2   Grade  0.5  1.5  --  --  3   Minutes  15  15  --  --  15   METs  2.6  2.94  --  --  3.36     NuStep   Level  2  2  --  3  3   SPM  80  --  --  80  80   Minutes  15  15  --  15  15   METs  2.4  2.5  --  3.7  3.6     Home Exercise Plan   Plans to continue exercise at  --  --  Home (comment) elliptical  Home (comment) elliptical  Home (comment) elliptical   Frequency  --  --  --  Add 1 additional day to program exercise sessions.  Add 1 additional day to program exercise sessions.   Initial Home Exercises Provided  --  --  --  08/21/19  08/21/19   Row Name 10/16/19 1000 10/29/19 1200 11/13/19 1100 11/26/19 1500 12/09/19 1600     Response to Exercise   Blood Pressure (Admit)  124/64  148/58  122/62  146/72  122/56   Blood Pressure (Exercise)  130/70  166/74  142/82  144/82  130/66   Blood Pressure (Exit)  112/60  142/80  112/60  110/70  118/60   Heart Rate (Admit)  73 bpm  78 bpm  85 bpm  101 bpm  72 bpm   Heart Rate (Exercise)  133 bpm  145 bpm  136 bpm  136 bpm  143 bpm   Heart Rate (Exit)  91 bpm  107 bpm  125 bpm  121 bpm  108 bpm   Rating of Perceived Exertion (Exercise)  '15  14  14  15  13   '$ Symptoms  none  none  none  none  none   Duration  Continue with 30 min of aerobic exercise without signs/symptoms of physical distress.  Continue with 30 min of aerobic exercise without signs/symptoms of physical distress.  Continue with 30 min of aerobic exercise without signs/symptoms of physical distress.  Continue with 30 min of aerobic exercise without signs/symptoms of physical distress.  Continue with 30 min of aerobic exercise without signs/symptoms of physical distress.   Intensity  THRR unchanged   THRR unchanged  THRR unchanged  THRR unchanged  THRR unchanged     Progression   Progression  Continue to progress workloads to maintain intensity without signs/symptoms of physical distress.  Continue to progress workloads to maintain intensity without signs/symptoms of physical distress.  Continue to progress workloads to maintain intensity without signs/symptoms of physical distress.  Continue to progress workloads to maintain intensity without signs/symptoms of physical distress.  Continue to progress workloads to maintain intensity without signs/symptoms of physical distress.   Average METs  2.63  3.45  3.2  3.7  2.94     Resistance Training   Training Prescription  Yes  Yes  Yes  Yes  Yes   Weight  4 lb   4lb   4lb  4 lb  4 lb   Reps  10-15  10-15  10-15  10-15  10-15     Interval Training   Interval Training  No  No  No  No  No     Treadmill   MPH  2  --  2.3  2.3  2.3   Grade  3  --  '3  3  3   '$ Minutes  15  --  '15  15  15   '$ METs  3.36  --  3.71  3.71  3.71     NuStep   Level  --  4  4  --  4   SPM  --  80  --  --  --   Minutes  --  15  15  --  15   METs  --  3.9  3.9  --  3.6     REL-XR   Level  2  --  2.5  2.5  2.5   Minutes  15  --  '15  15  15   '$ METs  1.9  --  2.2  --  1.8     Biostep-RELP   Level  --  2  2  --  2   SPM  --  50  --  --  --   Minutes  --  15  15  --  15   METs  --  3  3  --  3     Home Exercise Plan   Plans to continue exercise at  Home (comment) elliptical  Home (comment) elliptical  Home (comment) elliptical  Home (comment) elliptical  Home (comment) elliptical   Frequency  Add 1 additional day to program exercise sessions.  Add 1 additional day to program exercise sessions.  Add 2 additional days to program exercise sessions.  Add 2 additional days to program exercise sessions.  Add 2 additional days to program exercise sessions.   Initial Home Exercises Provided  08/21/19  08/21/19  08/21/19  08/21/19  08/21/19   Row Name 12/25/19 1500              Response to Exercise   Blood Pressure (Admit)  122/70       Blood Pressure (Exercise)  154/58       Blood Pressure (Exit)  134/82       Heart Rate (Admit)  78 bpm       Heart Rate (Exercise)  146 bpm       Heart Rate (Exit)  116 bpm       Rating of Perceived Exertion (Exercise)  16       Symptoms  none       Duration  Continue with 30 min of aerobic exercise without signs/symptoms of physical distress.       Intensity  THRR unchanged higher HR during weights not cardio         Progression   Progression  Continue to progress workloads to maintain intensity without signs/symptoms of physical distress.       Average METs  3.25         Resistance Training   Training Prescription  Yes       Weight  4 lb       Reps  10-15         Interval Training   Interval Training  No         NuStep   Level  4       SPM  80       Minutes  15       METs  3.5         Biostep-RELP   Level  2       SPM  50       Minutes  15       METs  3          Exercise Comments: Exercise Comments    Row Name 08/21/19 1711           Exercise Comments  Reviewed home exercise with pt today.  Pt plans to use home equipment for exercise.  Reviewed THR, pulse, RPE, sign and symptoms, NTG use, and when to call 911 or MD.  Also discussed weather considerations and indoor options.  Pt voiced understanding.          Exercise Goals and Review: Exercise Goals    Row Name 07/18/19 [phone]  Exercise Goals   Increase Physical Activity  Yes       Intervention  Provide advice, education, support and counseling about physical activity/exercise needs.;Develop an individualized exercise prescription for aerobic and resistive training based on initial evaluation findings, risk stratification, comorbidities and participant's personal goals.       Expected Outcomes  Short Term: Attend rehab on a regular basis to increase amount of physical activity.;Long Term: Add in home exercise to make exercise  part of routine and to increase amount of physical activity.;Long Term: Exercising regularly at least 3-5 days a week.       Increase Strength and Stamina  Yes       Intervention  Provide advice, education, support and counseling about physical activity/exercise needs.;Develop an individualized exercise prescription for aerobic and resistive training based on initial evaluation findings, risk stratification, comorbidities and participant's personal goals.       Expected Outcomes  Short Term: Increase workloads from initial exercise prescription for resistance, speed, and METs.;Short Term: Perform resistance training exercises routinely during rehab and add in resistance training at home;Long Term: Improve cardiorespiratory fitness, muscular endurance and strength as measured by increased METs and functional capacity (6MWT)       Able to understand and use rate of perceived exertion (RPE) scale  Yes       Intervention  Provide education and explanation on how to use RPE scale       Expected Outcomes  Short Term: Able to use RPE daily in rehab to express subjective intensity level;Long Term:  Able to use RPE to guide intensity level when exercising independently       Knowledge and understanding of Target Heart Rate Range (THRR)  Yes       Intervention  Provide education and explanation of THRR including how the numbers were predicted and where they are located for reference       Expected Outcomes  Short Term: Able to state/look up THRR;Short Term: Able to use daily as guideline for intensity in rehab;Long Term: Able to use THRR to govern intensity when exercising independently       Able to check pulse independently  Yes       Intervention  Provide education and demonstration on how to check pulse in carotid and radial arteries.;Review the importance of being able to check your own pulse for safety during independent exercise       Expected Outcomes  Short Term: Able to explain why pulse checking is  important during independent exercise;Long Term: Able to check pulse independently and accurately       Understanding of Exercise Prescription  Yes       Intervention  Provide education, explanation, and written materials on patient's individual exercise prescription       Expected Outcomes  Short Term: Able to explain program exercise prescription;Long Term: Able to explain home exercise prescription to exercise independently          Exercise Goals Re-Evaluation : Exercise Goals Re-Evaluation    Row Name 08/05/19 1652 08/21/19 1117 09/02/19 1700 09/03/19 1310 09/20/19 0932     Exercise Goal Re-Evaluation   Exercise Goals Review  Increase Physical Activity;Able to understand and use rate of perceived exertion (RPE) scale;Knowledge and understanding of Target Heart Rate Range (THRR);Understanding of Exercise Prescription;Increase Strength and Stamina;Able to check pulse independently  Increase Physical Activity;Increase Strength and Stamina;Understanding of Exercise Prescription  Increase Physical Activity;Increase Strength and Stamina;Understanding of Exercise Prescription;Able to understand and use rate of perceived  exertion (RPE) scale  Increase Physical Activity;Increase Strength and Stamina;Able to understand and use rate of perceived exertion (RPE) scale;Knowledge and understanding of Target Heart Rate Range (THRR);Able to check pulse independently;Understanding of Exercise Prescription  --   Comments  Reviewed RPE scale, THR and program prescription with pt today.  Pt voiced understanding and was given a copy of goals to take home.  Tiarah Shisler is doing well in rehab.  She has not had any symptoms since staring.  She is now up to 2.0 mph on the treadmill. We will continue to monitor her progress.  Aybree has recently increased her NS level to 3 and her TM level to 3.0/3.0%. She reports her SOB, which was her main limiting factor, has improved and she is able to do more at home without limitations. She is  able to walk faster and reports less fatigue. Ilianna has an elliptical at home she plans to start using once cardiac rehab classes are cut back to do covid closure. She plans to exercise at home when she can not come into rehab. She was given our you tube channel information and plans to use those exercise and education videos as well.  --  Out since last review with the holidays   Expected Outcomes  Short: Use RPE daily to regulate intensity. Long: Follow program prescription in THR.  Short: Review home exercise guidelines.  Long: Continue to build stamina.  Short: Continue to attend cardiac rehab and exercise at home on off days. Long: maintain an independent exercise routine to help reduce cardiac risk factors.  --  --   Row Name 10/02/19 1315 10/16/19 1055 10/28/19 1710 11/13/19 1149 11/26/19 1544     Exercise Goal Re-Evaluation   Exercise Goals Review  Increase Physical Activity;Increase Strength and Stamina;Able to understand and use rate of perceived exertion (RPE) scale;Knowledge and understanding of Target Heart Rate Range (THRR);Able to check pulse independently;Understanding of Exercise Prescription  Increase Physical Activity;Increase Strength and Stamina;Understanding of Exercise Prescription  Increase Physical Activity;Increase Strength and Stamina;Understanding of Exercise Prescription  Increase Physical Activity;Increase Strength and Stamina;Understanding of Exercise Prescription  Increase Physical Activity;Increase Strength and Stamina;Able to understand and use rate of perceived exertion (RPE) scale;Able to understand and use Dyspnea scale;Knowledge and understanding of Target Heart Rate Range (THRR);Able to check pulse independently;Understanding of Exercise Prescription   Comments  Shamecca has increased incline on TM.  She uses 4 lb weights for strength work.  Staff will monitor progress.  Zaila continues to do well in rehab.  She is now up to level 2 on the XR.  We will continue to monitor her  progress.  Ananda continues to attend Cardiac Rehab 2 days per week and exercises at home one extra day outside of class. Encouraged her to add 2 days of home exercise outside of class for a total of 4 days a week. Aries continues to progress with workloads and gaining strength and stamina.  Kitti Mcclish has been doing well in rehab.  She is excited to get back to three days a week this week in class.  She is now up to 2.3 mph on the treadmill. We will continue to monitor her progress.  Tessy has missed a couple days due to a work conflict.She plans to return ASAP.   Expected Outcomes  Short :  continue to exercise consistently Long : maintain exercise on her own  Short: Continue to attend class regularly as we transition back to schedule.  Long: Continue to improve  stamina.  Short: continue to attend Cardiac Rehab classes and add 1-2 days of exercise at home. Long: Become independent with exercise program.  Short: Increase REL workload  Long; Continue to improve stamina.  Short : get back to consistent attendance Long:  improve stamina   Row Name 12/09/19 1638 12/25/19 1558 12/25/19 1719         Exercise Goal Re-Evaluation   Exercise Goals Review  Increase Physical Activity;Increase Strength and Stamina;Understanding of Exercise Prescription  Increase Physical Activity;Increase Strength and Stamina;Able to understand and use rate of perceived exertion (RPE) scale;Able to understand and use Dyspnea scale;Knowledge and understanding of Target Heart Rate Range (THRR);Able to check pulse independently;Understanding of Exercise Prescription  Increase Physical Activity;Increase Strength and Stamina;Able to understand and use rate of perceived exertion (RPE) scale;Able to understand and use Dyspnea scale;Knowledge and understanding of Target Heart Rate Range (THRR);Able to check pulse independently;Understanding of Exercise Prescription     Comments  Mikell Camp is doing well in rehab. She is up to 3 METs on the BioStep.  We will  continue to monitor her progress.  Diamante wil complete HT soon.  She improved her walk test 25%.  Rosland wil complete HT soon.  She improved her walk test 25%. Evalee reports exercising when on the weekends, but plans to workout during the week after HT. Delanna has an REL that she uses at home for 20 minutes. Infinity is also thinking about joining the [facility] and will do resistence during the week when done.     Expected Outcomes  Short: Continue to increase workloads  Long: Continue to improve stamina.  Short: complete program Long: maintain fitness on her own  Short: complete program Long: maintain fitness on her own        Discharge Exercise Prescription (Final Exercise Prescription Changes): Exercise Prescription Changes - 12/25/19 1500      Response to Exercise   Blood Pressure (Admit)  122/70    Blood Pressure (Exercise)  154/58    Blood Pressure (Exit)  134/82    Heart Rate (Admit)  78 bpm    Heart Rate (Exercise)  146 bpm    Heart Rate (Exit)  116 bpm    Rating of Perceived Exertion (Exercise)  16    Symptoms  none    Duration  Continue with 30 min of aerobic exercise without signs/symptoms of physical distress.    Intensity  THRR unchanged   higher HR during weights not cardio     Progression   Progression  Continue to progress workloads to maintain intensity without signs/symptoms of physical distress.    Average METs  3.25      Resistance Training   Training Prescription  Yes    Weight  4 lb    Reps  10-15      Interval Training   Interval Training  No      NuStep   Level  4    SPM  80    Minutes  15    METs  3.5      Biostep-RELP   Level  2    SPM  50    Minutes  15    METs  3       Nutrition:  Target Goals: Understanding of nutrition guidelines, daily intake of sodium '1500mg'$ , cholesterol '200mg'$ , calories 30% from fat and 7% or less from saturated fats, daily to have 5 or more servings of fruits and vegetables.  Education: Hardy  verbal and written material supporting the discussion of sodium use in heart healthy nutrition. Review and explanation with models, verbal and written materials for utilization of the food label.   Education: General Nutrition Guidelines/Fats and Fiber: -Group instruction provided by verbal, written material, models and posters to present the general guidelines for heart healthy nutrition. Gives an explanation and review of dietary fats and fiber.   Biometrics:  Post Biometrics - 12/19/19 1727       Post  Biometrics   Height  5' 5.5" (1.664 m)    Weight  179 lb 14.4 oz (81.6 kg)    BMI (Calculated)  29.47    Single Leg Stand  3.16 seconds       Nutrition Therapy Plan and Nutrition Goals: Nutrition Therapy & Goals - 07/25/19 1100      Nutrition Therapy   Diet  low Na, HH    Protein (specify units)  65g    Fiber  25 grams    Whole Grain Foods  3 servings    Saturated Fats  12 max. grams    Fruits and Vegetables  5 servings/day    Sodium  1.5 grams      Personal Nutrition Goals   Nutrition Goal  ST: switch to whole grain bread LT: get in better shape    Comments  B: V8 can  grits and water L: sandwich w/ water or tea D: cheese and crackers or popcorn. weekend: [place] or [place], roast or potatoes. Office: sausage biscuit at [employer] w/ water L: sometimes no lunch bring egg salad sandwich, bolonga and mayo sandwich, or out chick-fil-a. 3x 20 oz water/day. banana, apples, grapes, sweet potatoes, broccoli, salads, pinto or baked beans. Pt reports liking tuna fish in water. Discussed HH eating.      Intervention Plan   Intervention  Prescribe, educate and counsel regarding individualized specific dietary modifications aiming towards targeted core components such as weight, hypertension, lipid management, diabetes, heart failure and other comorbidities.;Nutrition handout(s) given to patient.    Expected Outcomes  Short Term Goal: Understand basic principles of dietary content, such as  calories, fat, sodium, cholesterol and nutrients.;Short Term Goal: A plan has been developed with personal nutrition goals set during dietitian appointment.;Long Term Goal: Adherence to prescribed nutrition plan.       Nutrition Assessments: Nutrition Assessments - 07/18/19 1418      MEDFICTS Scores   Pre Score  42       MEDIFICTS Score Key:          ?70 Need to make dietary changes          40-70 Heart Healthy Diet         ? 40 Therapeutic Level Cholesterol Diet  Nutrition Goals Re-Evaluation: Nutrition Goals Re-Evaluation    [place] Name 08/19/19 1653 09/23/19 1719 10/28/19 1702 11/28/19 1710 12/25/19 1722     Goals   Nutrition Goal  ST: review HH eating handout LT: get in better shape  ST: No new goals at this time Lt: get in better shape  ST: No new goals at this time Lt: get in better shape  ST: No new goals at this time Lt: get in better shape  ST: No new goals at this time Lt: get in better shape   Comment  switched to whole grain bread. Will review handout on [place] eating and will make goals next re-eval.  Pt reports no new changes at this time and would not like to make any goals right  now.  Pt reports no new changes at this time and would not like to make any goals right now.  Continue with current changes  Continue with current changes   Expected Outcome  ST: review HH eating handout LT: get in better shape  ST: No new goals at this time Lt: get in better shape  ST: No new goals at this time Lt: get in better shape  ST: No new goals at this time Lt: get in better shape  ST: No new goals at this time Lt: get in better shape      Nutrition Goals Discharge (Final Nutrition Goals Re-Evaluation): Nutrition Goals Re-Evaluation - 12/25/19 1722      Goals   Nutrition Goal  ST: No new goals at this time Lt: get in better shape    Comment  Continue with current changes    Expected Outcome  ST: No new goals at this time Lt: get in better shape       Psychosocial: Target Goals:  Acknowledge presence or absence of significant depression and/or stress, maximize coping skills, provide positive support system. Participant is able to verbalize types and ability to use techniques and skills needed for reducing stress and depression.   Education: Depression - Provides group verbal and written instruction on the correlation between heart/lung disease and depressed mood, treatment options, and the stigmas associated with seeking treatment.   Education: Sleep Hygiene -Provides group verbal and written instruction about how sleep can affect your health.  Define sleep hygiene, discuss sleep cycles and impact of sleep habits. Review good sleep hygiene tips.     Education: Stress and Anxiety: - Provides group verbal and written instruction about the health risks of elevated stress and causes of high stress.  Discuss the correlation between heart/lung disease and anxiety and treatment options. Review healthy ways to manage with stress and anxiety.    Initial Review & Psychosocial Screening: Initial Psych Review & Screening - 07/10/19 1112      Initial Review   Current issues with  Current Stress Concerns    Source of Stress Concerns  Chronic Illness;Unable to participate in former interests or hobbies;Unable to perform yard/household activities    Comments  Current stressor is health with having dizzy spells and risk of passing out.  She  has been lucky enought that they have not happened at work.  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      Family Dynamics   Good Support System?  Yes   Husband and daughter and granddaughter lives nearby     Barriers   Psychosocial barriers to participate in program  The patient should benefit from training in stress management and relaxation.;Psychosocial barriers identified (see note)      Screening Interventions   Interventions  Provide feedback about the scores to participant;To provide support and resources with  identified psychosocial needs;Encouraged to exercise    Expected Outcomes  Short Term goal: Utilizing psychosocial counselor, staff and physician to assist with identification of specific Stressors or current issues interfering with healing process. Setting desired goal for each stressor or current issue identified.;Long Term Goal: Stressors or current issues are controlled or eliminated.;Short Term goal: Identification and review with participant of any Quality of Life or Depression concerns found by scoring the questionnaire.;Long Term goal: The participant improves quality of Life and PHQ9 Scores as seen by post scores and/or verbalization of changes       Quality of Life Scores:  Quality of Life - 07/18/19 1437      Quality of Life   Select  Quality of Life      Quality of Life Scores   Health/Function Pre  21.93 %    Socioeconomic Pre  28.75 %    Psych/Spiritual Pre  29.14 %    Family Pre  28.8 %    GLOBAL Pre  25.91 %      Scores of 19 and below usually indicate a poorer quality of life in these areas.  A difference of  2-3 points is a clinically meaningful difference.  A difference of 2-3 points in the total score of the Quality of Life Index has been associated with significant improvement in overall quality of life, self-image, physical symptoms, and general health in studies assessing change in quality of life.  PHQ-9: Recent Review Flowsheet Data    Depression screen [facility] 2/9 07/18/2019   Decreased Interest 0   Down, Depressed, Hopeless 0   PHQ - 2 Score 0   Altered sleeping 0   Tired, decreased energy 3   Change in appetite 0   Feeling bad or failure about yourself  0   Trouble concentrating 0   Moving slowly or fidgety/restless 0   Suicidal thoughts 0   PHQ-9 Score 3   Difficult doing work/chores Not difficult at all     Interpretation of Total Score  Total Score Depression Severity:  1-4 = Minimal depression, 5-9 = Mild depression, 10-14 = Moderate depression,  15-19 = Moderately severe depression, 20-27 = Severe depression   Psychosocial Evaluation and Intervention: Psychosocial Evaluation - 07/10/19 1140      Psychosocial Evaluation & Interventions   Interventions  Stress management education;Encouraged to exercise with the program and follow exercise prescription    Comments  Mary is doing well.  She has a strong support system. Her biggest stressors are her current health state as they try to solve her dizzy spells.  She is looking forward to exercise.  She also has a history of neuropathy in both feet that only limits her occassionally.  She has a granddaughter that lives near by but hasn't seen since March due to [employer].  She also has a leaky valve that they are continuing to monitor.  She will need the 5pm time slot due to work.    Expected Outcomes  Short: Attend rehab regularly around work schedule.  Long: Continue to cope positively.    Continue Psychosocial Services   Follow up required by staff       Psychosocial Re-Evaluation: Psychosocial Re-Evaluation    [place] Name 09/02/19 1713 09/23/19 1727 10/28/19 1715 11/18/19 1716 12/25/19 1722     Psychosocial Re-Evaluation   Current issues with  Current Stress Concerns  Current Sleep Concerns;Current Stress Concerns  Current Sleep Concerns;Current Stress Concerns  --  Current Sleep Concerns;Current Stress Concerns   Comments  Jaiyana reports no new stress concerns. She still deals with occational dizzy spells but this has improved since her doctor has adjusted her blood pressure medication.  Myeisha has no new stress concerns.  She sleeps well most of the time.  Hana has no new stress concerns.  She sleeps well most of the time.  Enijah reports no new stress concerns.  She walks at work when she gets stressed out.  She sleeps well.  Yesha reports no new stress concerns. She sleeps well.   Expected Outcomes  Short: Attend [facility] stress management education to decrease stress. Long: Maintain exercise  Post [facility] to  keep stress at a minimum.  Short :  maintain positive outlook Long : manage stress long term  Short :  maintain positive outlook Long : manage stress long term  Short: continue to manage stress Long: maintain low stress levels  Short: continue to manage stress Long: maintain low stress levels   Interventions  Encouraged to attend Cardiac Rehabilitation for the exercise  --  Encouraged to attend Cardiac Rehabilitation for the exercise  --  Encouraged to attend Cardiac Rehabilitation for the exercise   Continue Psychosocial Services   Follow up required by staff  --  Follow up required by staff  --  --      Psychosocial Discharge (Final Psychosocial Re-Evaluation): Psychosocial Re-Evaluation - 12/25/19 1722      Psychosocial Re-Evaluation   Current issues with  Current Sleep Concerns;Current Stress Concerns    Comments  Zaia reports no new stress concerns. She sleeps well.    Expected Outcomes  Short: continue to manage stress Long: maintain low stress levels    Interventions  Encouraged to attend Cardiac Rehabilitation for the exercise       Vocational Rehabilitation: Provide vocational rehab assistance to qualifying candidates.   Vocational Rehab Evaluation & Intervention: Vocational Rehab - 07/10/19 1118      Initial Vocational Rehab Evaluation & Intervention   Assessment shows need for Vocational Rehabilitation  No       Education: Education Goals: Education classes will be provided on a variety of topics geared toward better understanding of heart health and risk factor modification. Participant will state understanding/return demonstration of topics presented as noted by education test scores.  Learning Barriers/Preferences: Learning Barriers/Preferences - 07/10/19 1117      Learning Barriers/Preferences   Learning Barriers  Sight   glasses   Learning Preferences  Written Material;Verbal Instruction;Skilled Demonstration       General Cardiac Education  Topics:  AED/CPR: - Group verbal and written instruction with the use of models to demonstrate the basic use of the AED with the basic ABC's of resuscitation.   Anatomy & Physiology of the Heart: - Group verbal and written instruction and models provide basic cardiac anatomy and physiology, with the coronary electrical and arterial systems. Review of Valvular disease and Heart Failure   Cardiac Procedures: - Group verbal and written instruction to review commonly prescribed medications for heart disease. Reviews the medication, class of the drug, and side effects. Includes the steps to properly store meds and maintain the prescription regimen. (beta blockers and nitrates)   Cardiac Medications I: - Group verbal and written instruction to review commonly prescribed medications for heart disease. Reviews the medication, class of the drug, and side effects. Includes the steps to properly store meds and maintain the prescription regimen.   Cardiac Medications II: -Group verbal and written instruction to review commonly prescribed medications for heart disease. Reviews the medication, class of the drug, and side effects. (all other drug classes)    Go Sex-Intimacy & Heart Disease, Get SMART - Goal Setting: - Group verbal and written instruction through game format to discuss heart disease and the return to sexual intimacy. Provides group verbal and written material to discuss and apply goal setting through the application of the S.M.A.R.T. Method.   Other Matters of the Heart: - Provides group verbal, written materials and models to describe Stable Angina and Peripheral Artery. Includes description of the disease process and treatment options available to the cardiac patient.   Infection Prevention: - Provides  verbal and written material to individual with discussion of infection control including proper hand washing and proper equipment cleaning during exercise session.   Cardiac Rehab  from 08/15/2019 in [facility] Cardiac and Pul[facility] Rehab  Date  07/18/19  Educator  AS  Instruction Review Code  1- Verbalizes Understanding      Falls Prevention: - Provides verbal and written material to individual with discussion of falls prevention and safety.   Cardiac Rehab from 08/15/2019 in [facility] Cardiac and Pulmonary Rehab  Date  07/18/19  Educator  AS  Instruction Review Code  1- Verbalizes Understanding      Other: -Provides group and verbal instruction on various topics (see comments)   Knowledge Questionnaire Score: Knowledge Questionnaire Score - 07/18/19 1438      Knowledge Questionnaire Score   Pre Score  22/26 HF exercise nutrition       Core Components/Risk Factors/Patient Goals at Admission: Personal Goals and Risk Factors at Admission - 07/18/19 1442      Core Components/Risk Factors/Patient Goals on Admission    Weight Management  Yes;Weight Loss    Intervention  Weight Management: Develop a combined nutrition and exercise program designed to reach desired caloric intake, while maintaining appropriate intake of nutrient and fiber, sodium and fats, and appropriate energy expenditure required for the weight goal.;Weight Management: Provide education and appropriate resources to help participant work on and attain dietary goals.    Admit Weight  188 lb 6.4 oz (85.5 kg)    Goal Weight: Short Term  180 lb (81.6 kg)    Goal Weight: Long Term  175 lb (79.4 kg)    Expected Outcomes  Short Term: Continue to assess and modify interventions until short term weight is achieved;Long Term: Adherence to nutrition and physical activity/exercise program aimed toward attainment of established weight goal;Weight Loss: Understanding of general recommendations for a balanced deficit meal plan, which promotes 1-2 lb weight loss per week and includes a negative energy balance of [phone] kcal/d;Understanding recommendations for meals to include 15-35% energy as protein, 25-35% energy from  fat, 35-60% energy from carbohydrates, less than '200mg'$  of dietary cholesterol, 20-35 gm of total fiber daily;Understanding of distribution of calorie intake throughout the day with the consumption of 4-5 meals/snacks    Intervention  Provide education on lifestyle modifcations including regular physical activity/exercise, weight management, moderate sodium restriction and increased consumption of fresh fruit, vegetables, and low fat dairy, alcohol moderation, and smoking cessation.;Monitor prescription use compliance.    Expected Outcomes  Short Term: Continued assessment and intervention until BP is < 140/40m HG in hypertensive participants. < 130/87mHG in hypertensive participants with diabetes, heart failure or chronic kidney disease.;Long Term: Maintenance of blood pressure at goal levels.       Education:Diabetes - Individual verbal and written instruction to review signs/symptoms of diabetes, desired ranges of glucose level fasting, after meals and with exercise. Acknowledge that pre and post exercise glucose checks will be done for 3 sessions at entry of program.   Education: Know Your Numbers and Risk Factors: -Group verbal and written instruction about important numbers in your health.  Discussion of what are risk factors and how they play a role in the disease process.  Review of Cholesterol, Blood Pressure, Diabetes, and BMI and the role they play in your overall health.   Core Components/Risk Factors/Patient Goals Review:  Goals and Risk Factor Review    Row Name 09/02/19 1717 09/23/19 1724 10/28/19 1713 11/18/19 1714 12/25/19 1723     Core  Components/Risk Factors/Patient Goals Review   Personal Goals Review  Weight Management/Obesity;Hypertension;Lipids  --  Weight Management/Obesity;Hypertension;Lipids  --  Weight Management/Obesity;Hypertension;Lipids   Review  Talonda has lost 8 lbs since starting cardiac rehab, changing diet, and exercising. She continues to take all meds as  prescribed to contol blood pressure and lipids. Her BP meds were recently adjusted, which has helped improve lightheadedness. BP and lipids are reported to be in acceptable ranges.  Dierra has continues to lose weight - total of 11 lb so far.  She is taking all meds as directed.  Janella is exercising at home  3 days a week  Toriann reported that while Cardiac Rehab classes were cut back due to Covid restrictions she started to gain some weight, but it is starting to come back down now that she is exercising more regularly. She reports taking all meds as prescribed to control risk facotors.  Jeraldean is taking all meds as directed.  Her weight is still coming down.  She does exercise at home.  Eddie is taking all meds as directed.  Her weight is still coming down.  She does exercise at home.   Expected Outcomes  Short: continue weight loss, short term weight goal 175 lbs. Long: Continue to maintain heart healthy lifestyle to control risk factors. Longer term weight goal 170 lbs.  Short - continue exercise and healthy eating Long: Reach goal weight  Short - continue exercise and healthy eating Long: Reach goal weight  Short : exercise consistently Long: reach goal weight  Short : exercise consistently Long: reach goal weight      Core Components/Risk Factors/Patient Goals at Discharge (Final Review):  Goals and Risk Factor Review - 12/25/19 1723      Core Components/Risk Factors/Patient Goals Review   Personal Goals Review  Weight Management/Obesity;Hypertension;Lipids    Review  Brittley is taking all meds as directed.  Her weight is still coming down.  She does exercise at home.    Expected Outcomes  Short : exercise consistently Long: reach goal weight       ITP Comments: ITP Comments    [place] Name 07/10/19 1139 07/24/19 1213 07/25/19 1119 08/05/19 1651 08/14/19 0958   ITP Comments  Completed virtutal orientation.  Documentation for diagnosis can be found in [facility] encounter [facility]/12/19.  EP eval scheduled for Thursday 11/5.  Pt  scheduled for holter monitor with follow up on 11/17.  Will check then to see when to start rehab officially.  Completed Initial RD eval  First full day of exercise!  Patient was oriented to gym and equipment including functions, settings, policies, and procedures.  Patient's individual exercise prescription and treatment plan were reviewed.  All starting workloads were established based on the results of the 6 minute walk test done at initial orientation visit.  The plan for exercise progression was also introduced and progression will be customized based on patient's performance and goals.  30 day review competed . ITP sent to Dr Emily Filbert for review, changes as needed and ITP approval signature.  New to program   [place] Name 08/21/19 1711 09/11/19 0842 09/16/19 1711 10/09/19 1129 10/14/19 1629   ITP Comments  Reviewed home exercise with pt today.  Pt plans to use home equipment for exercise.  Reviewed THR, pulse, RPE, sign and symptoms, NTG use, and when to call 911 or MD.  Also discussed weather considerations and indoor options.  Pt voiced understanding.  30 day review competed . ITP sent to Dr Emily Filbert  for review, changes as needed and ITP approval signature New to program  Virtual follow up completed. No in person class this week. Khamille is doing well. She purchased some exercise equipment and is trying to get used to it. She is working on staying active while not in class. No other concerns noted.  30 day review completed. ITP sent to Dr. Emily Filbert, Medical Director of Cardiac and Pulmonary Rehab. Continue with ITP unless changes are made by physician.  Department operating under reduced schedule until further notice by request from hospital leadership.  Follow up call completed. Jayd is doing well, staying active. She is looking forward to coming next week to Cardiac Rehab.   [place] Name 11/06/19 [phone] 12/04/19 0620 01/01/20 0546       ITP Comments  30 day chart review completed. ITP sent to Dr Zachery Dakins Medical Director, for review,changes as needed and signature.  30 day chart review completed. ITP sent to Dr Zachery Dakins Medical Director, for review,changes as needed and signature. Continue with ITP if no changes requested  30 Day review completed. Medical Director review done, changes made as directed,and approval shown by signature of Market researcher.        Comments:

## 2020-01-01 NOTE — Progress Notes (Signed)
Daily Session Note  Patient Details  Name: Tamara Villegas MRN: [id number] Date of Birth: 04/06/1946 Referring Provider:     Cardiac Rehab from 07/18/2019 in [facility] Cardiac and Pulmonary Rehab  Referring Provider  Paraschos      Encounter Date: 01/01/2020  Check In: Session Check In - 01/01/20 1701      Check-In   Supervising physician immediately available to respond to emergencies  See telemetry face sheet for immediately available ER MD    Location  [facility]-Cardiac & Pulmonary Rehab    Staff Present  Renita Papa, RN BSN;Melissa Caiola RDN, Rowe Pavy, BA, ACSM CEP, Exercise Physiologist    Virtual Visit  No    Medication changes reported      No    Fall or balance concerns reported     No    Warm-up and Cool-down  Performed on first and last piece of equipment    Resistance Training Performed  Yes    VAD Patient?  No    PAD/SET Patient?  No      Pain Assessment   Currently in Pain?  No/denies          Social History   Tobacco Use  Smoking Status Former Smoker  Smokeless Tobacco Never Used  Tobacco Comment   as teenager    Goals Met:  Independence with exercise equipment Exercise tolerated well No report of cardiac concerns or symptoms Strength training completed today  Goals Unmet:  Not Applicable  Comments: Pt able to follow exercise prescription today without complaint.  Will continue to monitor for progression.    Dr. Emily Filbert is Medical Director for [place] and [facility].

## 2020-01-02 ENCOUNTER — Encounter: Payer: BC Managed Care – PPO | Admitting: *Deleted

## 2020-01-02 ENCOUNTER — Other Ambulatory Visit: Payer: Self-pay

## 2020-01-02 DIAGNOSIS — Z955 Presence of coronary angioplasty implant and graft: Secondary | ICD-10-CM | POA: Diagnosis not present

## 2020-01-02 NOTE — Progress Notes (Signed)
Cardiac Individual Treatment Plan  Patient Details  Name: Tamara Villegas MRN: [id number] Date of Birth: 1946-03-07 Referring Provider:     Cardiac Rehab from 07/18/2019 in [facility] Cardiac and Pulmonary Rehab  Referring Provider  Paraschos      Initial Encounter Date:    Cardiac Rehab from 07/18/2019 in [facility] Cardiac and Pulmonary Rehab  Date  07/18/19      Visit Diagnosis: S/P coronary artery stent placement  Patient's Home Medications on Admission:  Current Outpatient Medications:  .  acetaminophen (TYLENOL) 325 MG tablet, Take 650 mg by mouth every 6 (six) hours as needed for moderate pain or headache., Disp: , Rfl:  .  aspirin 81 MG tablet, Take 81 mg by mouth daily., Disp: , Rfl:  .  Calcium Carb-Cholecalciferol (CALCIUM 600 + D PO), Take 1 tablet by mouth 2 (two) times daily., Disp: , Rfl:  .  DULoxetine (CYMBALTA) 30 MG capsule, Take 30 mg by mouth daily., Disp: , Rfl:  .  latanoprost (XALATAN) 0.005 % ophthalmic solution, Place 1 drop into both eyes at bedtime., Disp: , Rfl:  .  lisinopril (ZESTRIL) 20 MG tablet, Take 20 mg by mouth daily., Disp: , Rfl:  .  lisinopril-hydrochlorothiazide (ZESTORETIC) 20-12.5 MG tablet, Take 1 tablet by mouth daily., Disp: , Rfl:  .  Multiple Vitamin (MULTIVITAMIN) tablet, Take 1 tablet by mouth daily., Disp: , Rfl:  .  Omega-3 Fatty Acids (FISH OIL) 1000 MG CAPS, Take 1,000-2,000 mg by mouth See admin instructions. Take 1000 mg in the morning and 2000 mg at night, Disp: , Rfl:  .  simvastatin (ZOCOR) 40 MG tablet, Take 40 mg by mouth at bedtime., Disp: , Rfl:  .  ticagrelor (BRILINTA) 90 MG TABS tablet, Take 1 tablet (90 mg total) by mouth 2 (two) times daily., Disp: 60 tablet, Rfl: 11 .  timolol (BETIMOL) 0.5 % ophthalmic solution, Place 1 drop into both eyes daily., Disp: , Rfl:   Past Medical History: Past Medical History:  Diagnosis Date  . Hepatitis 1993   B.   . Hypercholesteremia   . Hypertension   . Neuropathy    feet  . Wears  dentures    full upper, partial lower    Tobacco Use: Social History   Tobacco Use  Smoking Status Former Smoker  Smokeless Tobacco Never Used  Tobacco Comment   as teenager    Labs: Recent Review Flowsheet Data    There is no flowsheet data to display.       Exercise Target Goals: Exercise Program Goal: Individual exercise prescription set using results from initial 6 min walk test and THRR while considering  patient's activity barriers and safety.   Exercise Prescription Goal: Initial exercise prescription builds to 30-45 minutes a day of aerobic activity, 2-3 days per week.  Home exercise guidelines will be given to patient during program as part of exercise prescription that the participant will acknowledge.   Education: Aerobic Exercise & Resistance Training: - Gives group verbal and written instruction on the various components of exercise. Focuses on aerobic and resistive training programs and the benefits of this training and how to safely progress through these programs..   Education: Exercise & Equipment Safety: - Individual verbal instruction and demonstration of equipment use and safety with use of the equipment.   Cardiac Rehab from 08/15/2019 in [facility] Cardiac and Pulmonary Rehab  Date  07/18/19  Educator  AS  Instruction Review Code  1- Verbalizes Understanding      Education:  Exercise Physiology & General Exercise Guidelines: - Group verbal and written instruction with models to review the exercise physiology of the cardiovascular system and associated critical values. Provides general exercise guidelines with specific guidelines to those with heart or lung disease.    Education: Flexibility, Balance, Mind/Body Relaxation: Provides group verbal/written instruction on the benefits of flexibility and balance training, including mind/body exercise modes such as yoga, pilates and tai chi.  Demonstration and skill practice provided.   Cardiac Rehab from  08/15/2019 in [facility] Cardiac and Pulmonary Rehab  Date  08/15/19  Educator  AS  Instruction Review Code  1- Verbalizes Understanding      Activity Barriers & Risk Stratification: Activity Barriers & Cardiac Risk Stratification - 07/10/19 1119      Activity Barriers & Cardiac Risk Stratification   Activity Barriers  Deconditioning;Other (comment);Balance Concerns;Muscular Weakness;History of Falls    Comments  nueropathy in both feet, twisted left ankle in August (Still bothering her), dizzy spells recently    Cardiac Risk Stratification  Moderate       6 Minute Walk: 6 Minute Walk    Row Name 07/18/19 1428 07/18/19 1728 12/19/19 1724     6 Minute Walk   Phase  Initial  --  Discharge   Distance  1200 feet  1200 feet  1500 feet   Distance % Change  --  --  25 %   Distance Feet Change  --  --  300 ft   Walk Time  6 minutes  6 minutes  6 minutes   # of Rest Breaks  --  0  0   MPH  --  2.27  2.84   METS  --  2.68  3.71   RPE  --  17  15   Perceived Dyspnea   --  3  --   VO2 Peak  --  9.38  12.98   Symptoms  --  No  No   Resting HR  --  65 bpm  82 bpm   Resting BP  --  124/64  132/80   Max Ex. HR  --  111 bpm  130 bpm   Max Ex. BP  --  158/66  180/80   2 Minute Post BP  --  140/68  150/80      Oxygen Initial Assessment:   Oxygen Re-Evaluation:   Oxygen Discharge (Final Oxygen Re-Evaluation):   Initial Exercise Prescription: Initial Exercise Prescription - 07/18/19 1400      Date of Initial Exercise RX and Referring Provider   Date  07/18/19    Referring Provider  Paraschos      Treadmill   MPH  2    Grade  0.5    Minutes  15    METs  2.6      Recumbant Bike   Level  2    RPM  60    Watts  18    Minutes  15    METs  2.6      NuStep   Level  2    SPM  80    Minutes  15    METs  2.6      REL-XR   Level  2    Speed  50    Minutes  15    METs  2.6      Biostep-RELP   Level  2    SPM  50    Minutes  15    METs  2.6  Prescription Details    Frequency (times per week)  3    Duration  Progress to 30 minutes of continuous aerobic without signs/symptoms of physical distress      Intensity   THRR 40-80% of Max Heartrate  98-130    Ratings of Perceived Exertion  11-15      Resistance Training   Training Prescription  Yes    Weight  4 lb    Reps  10-15       Perform Capillary Blood Glucose checks as needed.  Exercise Prescription Changes: Exercise Prescription Changes    Row Name 08/06/19 0800 08/21/19 1100 08/21/19 1700 09/03/19 1300 10/02/19 1300     Response to Exercise   Blood Pressure (Admit)  138/72  130/82  --  122/70  144/60   Blood Pressure (Exercise)  130/80  162/82  --  128/52  168/82   Blood Pressure (Exit)  130/68  118/62  --  122/56  110/62   Heart Rate (Admit)  84 bpm  92 bpm  --  85 bpm  90 bpm   Heart Rate (Exercise)  130 bpm  134 bpm  --  123 bpm  147 bpm   Heart Rate (Exit)  90 bpm  111 bpm  --  84 bpm  126 bpm   Rating of Perceived Exertion (Exercise)  --  15  --  15  15   Symptoms  none  none  --  none  none   Comments  first day   --  --  --  --   Duration  Continue with 30 min of aerobic exercise without signs/symptoms of physical distress.  Continue with 30 min of aerobic exercise without signs/symptoms of physical distress.  --  Continue with 30 min of aerobic exercise without signs/symptoms of physical distress.  Continue with 30 min of aerobic exercise without signs/symptoms of physical distress.   Intensity  THRR unchanged  THRR unchanged  --  THRR unchanged  THRR unchanged     Progression   Progression  Continue to progress workloads to maintain intensity without signs/symptoms of physical distress.  Continue to progress workloads to maintain intensity without signs/symptoms of physical distress.  --  Continue to progress workloads to maintain intensity without signs/symptoms of physical distress.  Continue to progress workloads to maintain intensity without signs/symptoms of physical distress.    Average METs  2.5  2.82  --  3.7  3.48     Resistance Training   Training Prescription  Yes  Yes  --  Yes  Yes   Weight  4 lb  4 lb  --  4 lb  4 lb   Reps  10-15  10-15  --  10-15  10-15     Interval Training   Interval Training  No  No  --  No  No     Treadmill   MPH  2  2  --  --  2   Grade  0.5  1.5  --  --  3   Minutes  15  15  --  --  15   METs  2.6  2.94  --  --  3.36     NuStep   Level  2  2  --  3  3   SPM  80  --  --  80  80   Minutes  15  15  --  15  15   METs  2.4  2.5  --  3.7  3.6     Home Exercise Plan   Plans to continue exercise at  --  --  Home (comment) elliptical  Home (comment) elliptical  Home (comment) elliptical   Frequency  --  --  --  Add 1 additional day to program exercise sessions.  Add 1 additional day to program exercise sessions.   Initial Home Exercises Provided  --  --  --  08/21/19  08/21/19   Row Name 10/16/19 1000 10/29/19 1200 11/13/19 1100 11/26/19 1500 12/09/19 1600     Response to Exercise   Blood Pressure (Admit)  124/64  148/58  122/62  146/72  122/56   Blood Pressure (Exercise)  130/70  166/74  142/82  144/82  130/66   Blood Pressure (Exit)  112/60  142/80  112/60  110/70  118/60   Heart Rate (Admit)  73 bpm  78 bpm  85 bpm  101 bpm  72 bpm   Heart Rate (Exercise)  133 bpm  145 bpm  136 bpm  136 bpm  143 bpm   Heart Rate (Exit)  91 bpm  107 bpm  125 bpm  121 bpm  108 bpm   Rating of Perceived Exertion (Exercise)  '15  14  14  15  13   '$ Symptoms  none  none  none  none  none   Duration  Continue with 30 min of aerobic exercise without signs/symptoms of physical distress.  Continue with 30 min of aerobic exercise without signs/symptoms of physical distress.  Continue with 30 min of aerobic exercise without signs/symptoms of physical distress.  Continue with 30 min of aerobic exercise without signs/symptoms of physical distress.  Continue with 30 min of aerobic exercise without signs/symptoms of physical distress.   Intensity  THRR unchanged   THRR unchanged  THRR unchanged  THRR unchanged  THRR unchanged     Progression   Progression  Continue to progress workloads to maintain intensity without signs/symptoms of physical distress.  Continue to progress workloads to maintain intensity without signs/symptoms of physical distress.  Continue to progress workloads to maintain intensity without signs/symptoms of physical distress.  Continue to progress workloads to maintain intensity without signs/symptoms of physical distress.  Continue to progress workloads to maintain intensity without signs/symptoms of physical distress.   Average METs  2.63  3.45  3.2  3.7  2.94     Resistance Training   Training Prescription  Yes  Yes  Yes  Yes  Yes   Weight  4 lb   4lb   4lb  4 lb  4 lb   Reps  10-15  10-15  10-15  10-15  10-15     Interval Training   Interval Training  No  No  No  No  No     Treadmill   MPH  2  --  2.3  2.3  2.3   Grade  3  --  '3  3  3   '$ Minutes  15  --  '15  15  15   '$ METs  3.36  --  3.71  3.71  3.71     NuStep   Level  --  4  4  --  4   SPM  --  80  --  --  --   Minutes  --  15  15  --  15   METs  --  3.9  3.9  --  3.6     REL-XR   Level  2  --  2.5  2.5  2.5   Minutes  15  --  '15  15  15   '$ METs  1.9  --  2.2  --  1.8     Biostep-RELP   Level  --  2  2  --  2   SPM  --  50  --  --  --   Minutes  --  15  15  --  15   METs  --  3  3  --  3     Home Exercise Plan   Plans to continue exercise at  Home (comment) elliptical  Home (comment) elliptical  Home (comment) elliptical  Home (comment) elliptical  Home (comment) elliptical   Frequency  Add 1 additional day to program exercise sessions.  Add 1 additional day to program exercise sessions.  Add 2 additional days to program exercise sessions.  Add 2 additional days to program exercise sessions.  Add 2 additional days to program exercise sessions.   Initial Home Exercises Provided  08/21/19  08/21/19  08/21/19  08/21/19  08/21/19   Row Name 12/25/19 1500              Response to Exercise   Blood Pressure (Admit)  122/70       Blood Pressure (Exercise)  154/58       Blood Pressure (Exit)  134/82       Heart Rate (Admit)  78 bpm       Heart Rate (Exercise)  146 bpm       Heart Rate (Exit)  116 bpm       Rating of Perceived Exertion (Exercise)  16       Symptoms  none       Duration  Continue with 30 min of aerobic exercise without signs/symptoms of physical distress.       Intensity  THRR unchanged higher HR during weights not cardio         Progression   Progression  Continue to progress workloads to maintain intensity without signs/symptoms of physical distress.       Average METs  3.25         Resistance Training   Training Prescription  Yes       Weight  4 lb       Reps  10-15         Interval Training   Interval Training  No         NuStep   Level  4       SPM  80       Minutes  15       METs  3.5         Biostep-RELP   Level  2       SPM  50       Minutes  15       METs  3          Exercise Comments: Exercise Comments    Row Name 08/21/19 1711           Exercise Comments  Reviewed home exercise with pt today.  Pt plans to use home equipment for exercise.  Reviewed THR, pulse, RPE, sign and symptoms, NTG use, and when to call 911 or MD.  Also discussed weather considerations and indoor options.  Pt voiced understanding.          Exercise Goals and Review: Exercise Goals    Row Name 07/18/19 [id number]  Exercise Goals   Increase Physical Activity  Yes       Intervention  Provide advice, education, support and counseling about physical activity/exercise needs.;Develop an individualized exercise prescription for aerobic and resistive training based on initial evaluation findings, risk stratification, comorbidities and participant's personal goals.       Expected Outcomes  Short Term: Attend rehab on a regular basis to increase amount of physical activity.;Long Term: Add in home exercise to make exercise  part of routine and to increase amount of physical activity.;Long Term: Exercising regularly at least 3-5 days a week.       Increase Strength and Stamina  Yes       Intervention  Provide advice, education, support and counseling about physical activity/exercise needs.;Develop an individualized exercise prescription for aerobic and resistive training based on initial evaluation findings, risk stratification, comorbidities and participant's personal goals.       Expected Outcomes  Short Term: Increase workloads from initial exercise prescription for resistance, speed, and METs.;Short Term: Perform resistance training exercises routinely during rehab and add in resistance training at home;Long Term: Improve cardiorespiratory fitness, muscular endurance and strength as measured by increased METs and functional capacity (6MWT)       Able to understand and use rate of perceived exertion (RPE) scale  Yes       Intervention  Provide education and explanation on how to use RPE scale       Expected Outcomes  Short Term: Able to use RPE daily in rehab to express subjective intensity level;Long Term:  Able to use RPE to guide intensity level when exercising independently       Knowledge and understanding of Target Heart Rate Range (THRR)  Yes       Intervention  Provide education and explanation of THRR including how the numbers were predicted and where they are located for reference       Expected Outcomes  Short Term: Able to state/look up THRR;Short Term: Able to use daily as guideline for intensity in rehab;Long Term: Able to use THRR to govern intensity when exercising independently       Able to check pulse independently  Yes       Intervention  Provide education and demonstration on how to check pulse in carotid and radial arteries.;Review the importance of being able to check your own pulse for safety during independent exercise       Expected Outcomes  Short Term: Able to explain why pulse checking is  important during independent exercise;Long Term: Able to check pulse independently and accurately       Understanding of Exercise Prescription  Yes       Intervention  Provide education, explanation, and written materials on patient's individual exercise prescription       Expected Outcomes  Short Term: Able to explain program exercise prescription;Long Term: Able to explain home exercise prescription to exercise independently          Exercise Goals Re-Evaluation : Exercise Goals Re-Evaluation    Row Name 08/05/19 1652 08/21/19 1117 09/02/19 1700 09/03/19 1310 09/20/19 0932     Exercise Goal Re-Evaluation   Exercise Goals Review  Increase Physical Activity;Able to understand and use rate of perceived exertion (RPE) scale;Knowledge and understanding of Target Heart Rate Range (THRR);Understanding of Exercise Prescription;Increase Strength and Stamina;Able to check pulse independently  Increase Physical Activity;Increase Strength and Stamina;Understanding of Exercise Prescription  Increase Physical Activity;Increase Strength and Stamina;Understanding of Exercise Prescription;Able to understand and use rate of perceived  exertion (RPE) scale  Increase Physical Activity;Increase Strength and Stamina;Able to understand and use rate of perceived exertion (RPE) scale;Knowledge and understanding of Target Heart Rate Range (THRR);Able to check pulse independently;Understanding of Exercise Prescription  --   Comments  Reviewed RPE scale, THR and program prescription with pt today.  Pt voiced understanding and was given a copy of goals to take home.  Suki Crockett is doing well in rehab.  She has not had any symptoms since staring.  She is now up to 2.0 mph on the treadmill. We will continue to monitor her progress.  Lorrain has recently increased her NS level to 3 and her TM level to 3.0/3.0%. She reports her SOB, which was her main limiting factor, has improved and she is able to do more at home without limitations. She is  able to walk faster and reports less fatigue. Veronia has an elliptical at home she plans to start using once cardiac rehab classes are cut back to do covid closure. She plans to exercise at home when she can not come into rehab. She was given our you tube channel information and plans to use those exercise and education videos as well.  --  Out since last review with the holidays   Expected Outcomes  Short: Use RPE daily to regulate intensity. Long: Follow program prescription in THR.  Short: Review home exercise guidelines.  Long: Continue to build stamina.  Short: Continue to attend cardiac rehab and exercise at home on off days. Long: maintain an independent exercise routine to help reduce cardiac risk factors.  --  --   Row Name 10/02/19 1315 10/16/19 1055 10/28/19 1710 11/13/19 1149 11/26/19 1544     Exercise Goal Re-Evaluation   Exercise Goals Review  Increase Physical Activity;Increase Strength and Stamina;Able to understand and use rate of perceived exertion (RPE) scale;Knowledge and understanding of Target Heart Rate Range (THRR);Able to check pulse independently;Understanding of Exercise Prescription  Increase Physical Activity;Increase Strength and Stamina;Understanding of Exercise Prescription  Increase Physical Activity;Increase Strength and Stamina;Understanding of Exercise Prescription  Increase Physical Activity;Increase Strength and Stamina;Understanding of Exercise Prescription  Increase Physical Activity;Increase Strength and Stamina;Able to understand and use rate of perceived exertion (RPE) scale;Able to understand and use Dyspnea scale;Knowledge and understanding of Target Heart Rate Range (THRR);Able to check pulse independently;Understanding of Exercise Prescription   Comments  Chardai has increased incline on TM.  She uses 4 lb weights for strength work.  Staff will monitor progress.  Quinnlyn continues to do well in rehab.  She is now up to level 2 on the XR.  We will continue to monitor her  progress.  Jessilynn continues to attend Cardiac Rehab 2 days per week and exercises at home one extra day outside of class. Encouraged her to add 2 days of home exercise outside of class for a total of 4 days a week. Terree continues to progress with workloads and gaining strength and stamina.  Carmina Walle has been doing well in rehab.  She is excited to get back to three days a week this week in class.  She is now up to 2.3 mph on the treadmill. We will continue to monitor her progress.  Jamerica has missed a couple days due to a work conflict.She plans to return ASAP.   Expected Outcomes  Short :  continue to exercise consistently Long : maintain exercise on her own  Short: Continue to attend class regularly as we transition back to schedule.  Long: Continue to improve  stamina.  Short: continue to attend Cardiac Rehab classes and add 1-2 days of exercise at home. Long: Become independent with exercise program.  Short: Increase REL workload  Long; Continue to improve stamina.  Short : get back to consistent attendance Long:  improve stamina   Row Name 12/09/19 1638 12/25/19 1558 12/25/19 1719         Exercise Goal Re-Evaluation   Exercise Goals Review  Increase Physical Activity;Increase Strength and Stamina;Understanding of Exercise Prescription  Increase Physical Activity;Increase Strength and Stamina;Able to understand and use rate of perceived exertion (RPE) scale;Able to understand and use Dyspnea scale;Knowledge and understanding of Target Heart Rate Range (THRR);Able to check pulse independently;Understanding of Exercise Prescription  Increase Physical Activity;Increase Strength and Stamina;Able to understand and use rate of perceived exertion (RPE) scale;Able to understand and use Dyspnea scale;Knowledge and understanding of Target Heart Rate Range (THRR);Able to check pulse independently;Understanding of Exercise Prescription     Comments  AmeLie Hollars is doing well in rehab. She is up to 3 METs on the BioStep.  We will  continue to monitor her progress.  Preston wil complete HT soon.  She improved her walk test 25%.  Willow wil complete HT soon.  She improved her walk test 25%. Gyneth reports exercising when on the weekends, but plans to workout during the week after HT. Taja has an REL that she uses at home for 20 minutes. Yanilen is also thinking about joining the [employer] and will do resistence during the week when done.     Expected Outcomes  Short: Continue to increase workloads  Long: Continue to improve stamina.  Short: complete program Long: maintain fitness on her own  Short: complete program Long: maintain fitness on her own        Discharge Exercise Prescription (Final Exercise Prescription Changes): Exercise Prescription Changes - 12/25/19 1500      Response to Exercise   Blood Pressure (Admit)  122/70    Blood Pressure (Exercise)  154/58    Blood Pressure (Exit)  134/82    Heart Rate (Admit)  78 bpm    Heart Rate (Exercise)  146 bpm    Heart Rate (Exit)  116 bpm    Rating of Perceived Exertion (Exercise)  16    Symptoms  none    Duration  Continue with 30 min of aerobic exercise without signs/symptoms of physical distress.    Intensity  THRR unchanged   higher HR during weights not cardio     Progression   Progression  Continue to progress workloads to maintain intensity without signs/symptoms of physical distress.    Average METs  3.25      Resistance Training   Training Prescription  Yes    Weight  4 lb    Reps  10-15      Interval Training   Interval Training  No      NuStep   Level  4    SPM  80    Minutes  15    METs  3.5      Biostep-RELP   Level  2    SPM  50    Minutes  15    METs  3       Nutrition:  Target Goals: Understanding of nutrition guidelines, daily intake of sodium '1500mg'$ , cholesterol '200mg'$ , calories 30% from fat and 7% or less from saturated fats, daily to have 5 or more servings of fruits and vegetables.  Education: [place]  verbal and written material supporting the discussion of sodium use in heart healthy nutrition. Review and explanation with models, verbal and written materials for utilization of the food label.   Education: General Nutrition Guidelines/Fats and Fiber: -Group instruction provided by verbal, written material, models and posters to present the general guidelines for heart healthy nutrition. Gives an explanation and review of dietary fats and fiber.   Biometrics:  Post Biometrics - 12/19/19 1727       Post  Biometrics   Height  5' 5.5" (1.664 m)    Weight  179 lb 14.4 oz (81.6 kg)    BMI (Calculated)  29.47    Single Leg Stand  3.16 seconds       Nutrition Therapy Plan and Nutrition Goals: Nutrition Therapy & Goals - 07/25/19 1100      Nutrition Therapy   Diet  low Na, HH    Protein (specify units)  65g    Fiber  25 grams    Whole Grain Foods  3 servings    Saturated Fats  12 max. grams    Fruits and Vegetables  5 servings/day    Sodium  1.5 grams      Personal Nutrition Goals   Nutrition Goal  ST: switch to whole grain bread LT: get in better shape    Comments  B: V8 can  grits and water L: sandwich w/ water or tea D: cheese and crackers or popcorn. weekend: [place] or [place], roast or potatoes. Office: sausage biscuit at [employer] w/ water L: sometimes no lunch bring egg salad sandwich, [place] and mayo sandwich, or out chick-fil-a. 3x 20 oz water/day. banana, apples, grapes, sweet potatoes, broccoli, salads, pinto or baked beans. Pt reports liking tuna fish in water. Discussed HH eating.      Intervention Plan   Intervention  Prescribe, educate and counsel regarding individualized specific dietary modifications aiming towards targeted core components such as weight, hypertension, lipid management, diabetes, heart failure and other comorbidities.;Nutrition handout(s) given to patient.    Expected Outcomes  Short Term Goal: Understand basic principles of dietary content, such as  calories, fat, sodium, cholesterol and nutrients.;Short Term Goal: A plan has been developed with personal nutrition goals set during dietitian appointment.;Long Term Goal: Adherence to prescribed nutrition plan.       Nutrition Assessments: Nutrition Assessments - 07/18/19 1418      MEDFICTS Scores   Pre Score  42       MEDIFICTS Score Key:          ?70 Need to make dietary changes          40-70 Heart Healthy Diet         ? 40 Therapeutic Level Cholesterol Diet  Nutrition Goals Re-Evaluation: Nutrition Goals Re-Evaluation    [place] Name 08/19/19 1653 09/23/19 1719 10/28/19 1702 11/28/19 1710 12/25/19 1722     Goals   Nutrition Goal  ST: review HH eating handout LT: get in better shape  ST: No new goals at this time Lt: get in better shape  ST: No new goals at this time Lt: get in better shape  ST: No new goals at this time Lt: get in better shape  ST: No new goals at this time Lt: get in better shape   Comment  switched to whole grain bread. Will review handout on [place] eating and will make goals next re-eval.  Pt reports no new changes at this time and would not like to make any goals right  now.  Pt reports no new changes at this time and would not like to make any goals right now.  Continue with current changes  Continue with current changes   Expected Outcome  ST: review HH eating handout LT: get in better shape  ST: No new goals at this time Lt: get in better shape  ST: No new goals at this time Lt: get in better shape  ST: No new goals at this time Lt: get in better shape  ST: No new goals at this time Lt: get in better shape      Nutrition Goals Discharge (Final Nutrition Goals Re-Evaluation): Nutrition Goals Re-Evaluation - 12/25/19 1722      Goals   Nutrition Goal  ST: No new goals at this time Lt: get in better shape    Comment  Continue with current changes    Expected Outcome  ST: No new goals at this time Lt: get in better shape       Psychosocial: Target Goals:  Acknowledge presence or absence of significant depression and/or stress, maximize coping skills, provide positive support system. Participant is able to verbalize types and ability to use techniques and skills needed for reducing stress and depression.   Education: Depression - Provides group verbal and written instruction on the correlation between heart/lung disease and depressed mood, treatment options, and the stigmas associated with seeking treatment.   Education: Sleep Hygiene -Provides group verbal and written instruction about how sleep can affect your health.  Define sleep hygiene, discuss sleep cycles and impact of sleep habits. Review good sleep hygiene tips.     Education: Stress and Anxiety: - Provides group verbal and written instruction about the health risks of elevated stress and causes of high stress.  Discuss the correlation between heart/lung disease and anxiety and treatment options. Review healthy ways to manage with stress and anxiety.    Initial Review & Psychosocial Screening: Initial Psych Review & Screening - 07/10/19 1112      Initial Review   Current issues with  Current Stress Concerns    Source of Stress Concerns  Chronic Illness;Unable to participate in former interests or hobbies;Unable to perform yard/household activities    Comments  Current stressor is health with having dizzy spells and risk of passing out.  She  has been lucky enought that they have not happened at work.  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      Family Dynamics   Good Support System?  Yes   Husband and daughter and granddaughter lives nearby     Barriers   Psychosocial barriers to participate in program  The patient should benefit from training in stress management and relaxation.;Psychosocial barriers identified (see note)      Screening Interventions   Interventions  Provide feedback about the scores to participant;To provide support and resources with  identified psychosocial needs;Encouraged to exercise    Expected Outcomes  Short Term goal: Utilizing psychosocial counselor, staff and physician to assist with identification of specific Stressors or current issues interfering with healing process. Setting desired goal for each stressor or current issue identified.;Long Term Goal: Stressors or current issues are controlled or eliminated.;Short Term goal: Identification and review with participant of any Quality of Life or Depression concerns found by scoring the questionnaire.;Long Term goal: The participant improves quality of Life and PHQ9 Scores as seen by post scores and/or verbalization of changes       Quality of Life Scores:  Quality of Life - 07/18/19 1437      Quality of Life   Select  Quality of Life      Quality of Life Scores   Health/Function Pre  21.93 %    Socioeconomic Pre  28.75 %    Psych/Spiritual Pre  29.14 %    Family Pre  28.8 %    GLOBAL Pre  25.91 %      Scores of 19 and below usually indicate a poorer quality of life in these areas.  A difference of  2-3 points is a clinically meaningful difference.  A difference of 2-3 points in the total score of the Quality of Life Index has been associated with significant improvement in overall quality of life, self-image, physical symptoms, and general health in studies assessing change in quality of life.  PHQ-9: Recent Review Flowsheet Data    Depression screen [facility] 2/9 07/18/2019   Decreased Interest 0   Down, Depressed, Hopeless 0   PHQ - 2 Score 0   Altered sleeping 0   Tired, decreased energy 3   Change in appetite 0   Feeling bad or failure about yourself  0   Trouble concentrating 0   Moving slowly or fidgety/restless 0   Suicidal thoughts 0   PHQ-9 Score 3   Difficult doing work/chores Not difficult at all     Interpretation of Total Score  Total Score Depression Severity:  1-4 = Minimal depression, 5-9 = Mild depression, 10-14 = Moderate depression,  15-19 = Moderately severe depression, 20-27 = Severe depression   Psychosocial Evaluation and Intervention: Psychosocial Evaluation - 07/10/19 1140      Psychosocial Evaluation & Interventions   Interventions  Stress management education;Encouraged to exercise with the program and follow exercise prescription    Comments  Simi is doing well.  She has a strong support system. Her biggest stressors are her current health state as they try to solve her dizzy spells.  She is looking forward to exercise.  She also has a history of neuropathy in both feet that only limits her occassionally.  She has a granddaughter that lives near by but hasn't seen since March due to [place].  She also has a leaky valve that they are continuing to monitor.  She will need the 5pm time slot due to work.    Expected Outcomes  Short: Attend rehab regularly around work schedule.  Long: Continue to cope positively.    Continue Psychosocial Services   Follow up required by staff       Psychosocial Re-Evaluation: Psychosocial Re-Evaluation    [place] Name 09/02/19 1713 09/23/19 1727 10/28/19 1715 11/18/19 1716 12/25/19 1722     Psychosocial Re-Evaluation   Current issues with  Current Stress Concerns  Current Sleep Concerns;Current Stress Concerns  Current Sleep Concerns;Current Stress Concerns  --  Current Sleep Concerns;Current Stress Concerns   Comments  Rashae reports no new stress concerns. She still deals with occational dizzy spells but this has improved since her doctor has adjusted her blood pressure medication.  Toluwani has no new stress concerns.  She sleeps well most of the time.  Amandeep has no new stress concerns.  She sleeps well most of the time.  Aizah reports no new stress concerns.  She walks at work when she gets stressed out.  She sleeps well.  Krista reports no new stress concerns. She sleeps well.   Expected Outcomes  Short: Attend [facility] stress management education to decrease stress. Long: Maintain exercise  Post [facility] to  keep stress at a minimum.  Short :  maintain positive outlook Long : manage stress long term  Short :  maintain positive outlook Long : manage stress long term  Short: continue to manage stress Long: maintain low stress levels  Short: continue to manage stress Long: maintain low stress levels   Interventions  Encouraged to attend Cardiac Rehabilitation for the exercise  --  Encouraged to attend Cardiac Rehabilitation for the exercise  --  Encouraged to attend Cardiac Rehabilitation for the exercise   Continue Psychosocial Services   Follow up required by staff  --  Follow up required by staff  --  --      Psychosocial Discharge (Final Psychosocial Re-Evaluation): Psychosocial Re-Evaluation - 12/25/19 1722      Psychosocial Re-Evaluation   Current issues with  Current Sleep Concerns;Current Stress Concerns    Comments  Lupe reports no new stress concerns. She sleeps well.    Expected Outcomes  Short: continue to manage stress Long: maintain low stress levels    Interventions  Encouraged to attend Cardiac Rehabilitation for the exercise       Vocational Rehabilitation: Provide vocational rehab assistance to qualifying candidates.   Vocational Rehab Evaluation & Intervention: Vocational Rehab - 07/10/19 1118      Initial Vocational Rehab Evaluation & Intervention   Assessment shows need for Vocational Rehabilitation  No       Education: Education Goals: Education classes will be provided on a variety of topics geared toward better understanding of heart health and risk factor modification. Participant will state understanding/return demonstration of topics presented as noted by education test scores.  Learning Barriers/Preferences: Learning Barriers/Preferences - 07/10/19 1117      Learning Barriers/Preferences   Learning Barriers  Sight   glasses   Learning Preferences  Written Material;Verbal Instruction;Skilled Demonstration       General Cardiac Education  Topics:  AED/CPR: - Group verbal and written instruction with the use of models to demonstrate the basic use of the AED with the basic ABC's of resuscitation.   Anatomy & Physiology of the Heart: - Group verbal and written instruction and models provide basic cardiac anatomy and physiology, with the coronary electrical and arterial systems. Review of Valvular disease and Heart Failure   Cardiac Procedures: - Group verbal and written instruction to review commonly prescribed medications for heart disease. Reviews the medication, class of the drug, and side effects. Includes the steps to properly store meds and maintain the prescription regimen. (beta blockers and nitrates)   Cardiac Medications I: - Group verbal and written instruction to review commonly prescribed medications for heart disease. Reviews the medication, class of the drug, and side effects. Includes the steps to properly store meds and maintain the prescription regimen.   Cardiac Medications II: -Group verbal and written instruction to review commonly prescribed medications for heart disease. Reviews the medication, class of the drug, and side effects. (all other drug classes)    Go Sex-Intimacy & Heart Disease, Get SMART - Goal Setting: - Group verbal and written instruction through game format to discuss heart disease and the return to sexual intimacy. Provides group verbal and written material to discuss and apply goal setting through the application of the S.M.A.R.T. Method.   Other Matters of the Heart: - Provides group verbal, written materials and models to describe Stable Angina and Peripheral Artery. Includes description of the disease process and treatment options available to the cardiac patient.   Infection Prevention: - Provides  verbal and written material to individual with discussion of infection control including proper hand washing and proper equipment cleaning during exercise session.   Cardiac Rehab  from 08/15/2019 in [facility] Cardiac and Pulmonary Rehab  Date  07/18/19  Educator  AS  Instruction Review Code  1- Verbalizes Understanding      Falls Prevention: - Provides verbal and written material to individual with discussion of falls prevention and safety.   Cardiac Rehab from 08/15/2019 in [facility] Cardiac and Pulmonary Rehab  Date  07/18/19  Educator  AS  Instruction Review Code  1- Verbalizes Understanding      Other: -Provides group and verbal instruction on various topics (see comments)   Knowledge Questionnaire Score: Knowledge Questionnaire Score - 07/18/19 1438      Knowledge Questionnaire Score   Pre Score  22/26 HF exercise nutrition       Core Components/Risk Factors/Patient Goals at Admission: Personal Goals and Risk Factors at Admission - 07/18/19 1442      Core Components/Risk Factors/Patient Goals on Admission    Weight Management  Yes;Weight Loss    Intervention  Weight Management: Develop a combined nutrition and exercise program designed to reach desired caloric intake, while maintaining appropriate intake of nutrient and fiber, sodium and fats, and appropriate energy expenditure required for the weight goal.;Weight Management: Provide education and appropriate resources to help participant work on and attain dietary goals.    Admit Weight  188 lb 6.4 oz (85.5 kg)    Goal Weight: Short Term  180 lb (81.6 kg)    Goal Weight: Long Term  175 lb (79.4 kg)    Expected Outcomes  Short Term: Continue to assess and modify interventions until short term weight is achieved;Long Term: Adherence to nutrition and physical activity/exercise program aimed toward attainment of established weight goal;Weight Loss: Understanding of general recommendations for a balanced deficit meal plan, which promotes 1-2 lb weight loss per week and includes a negative energy balance of [phone] kcal/d;Understanding recommendations for meals to include 15-35% energy as protein, 25-35% energy from  fat, 35-60% energy from carbohydrates, less than '200mg'$  of dietary cholesterol, 20-35 gm of total fiber daily;Understanding of distribution of calorie intake throughout the day with the consumption of 4-5 meals/snacks    Intervention  Provide education on lifestyle modifcations including regular physical activity/exercise, weight management, moderate sodium restriction and increased consumption of fresh fruit, vegetables, and low fat dairy, alcohol moderation, and smoking cessation.;Monitor prescription use compliance.    Expected Outcomes  Short Term: Continued assessment and intervention until BP is < 140/71m HG in hypertensive participants. < 130/831mHG in hypertensive participants with diabetes, heart failure or chronic kidney disease.;Long Term: Maintenance of blood pressure at goal levels.       Education:Diabetes - Individual verbal and written instruction to review signs/symptoms of diabetes, desired ranges of glucose level fasting, after meals and with exercise. Acknowledge that pre and post exercise glucose checks will be done for 3 sessions at entry of program.   Education: Know Your Numbers and Risk Factors: -Group verbal and written instruction about important numbers in your health.  Discussion of what are risk factors and how they play a role in the disease process.  Review of Cholesterol, Blood Pressure, Diabetes, and BMI and the role they play in your overall health.   Core Components/Risk Factors/Patient Goals Review:  Goals and Risk Factor Review    Row Name 09/02/19 1717 09/23/19 1724 10/28/19 1713 11/18/19 1714 12/25/19 1723     Core  Components/Risk Factors/Patient Goals Review   Personal Goals Review  Weight Management/Obesity;Hypertension;Lipids  --  Weight Management/Obesity;Hypertension;Lipids  --  Weight Management/Obesity;Hypertension;Lipids   Review  Annalisse has lost 8 lbs since starting cardiac rehab, changing diet, and exercising. She continues to take all meds as  prescribed to contol blood pressure and lipids. Her BP meds were recently adjusted, which has helped improve lightheadedness. BP and lipids are reported to be in acceptable ranges.  Tameaka has continues to lose weight - total of 11 lb so far.  She is taking all meds as directed.  Madelyn is exercising at home  3 days a week  Monteen reported that while Cardiac Rehab classes were cut back due to Covid restrictions she started to gain some weight, but it is starting to come back down now that she is exercising more regularly. She reports taking all meds as prescribed to control risk facotors.  Delores is taking all meds as directed.  Her weight is still coming down.  She does exercise at home.  Mckayla is taking all meds as directed.  Her weight is still coming down.  She does exercise at home.   Expected Outcomes  Short: continue weight loss, short term weight goal 175 lbs. Long: Continue to maintain heart healthy lifestyle to control risk factors. Longer term weight goal 170 lbs.  Short - continue exercise and healthy eating Long: Reach goal weight  Short - continue exercise and healthy eating Long: Reach goal weight  Short : exercise consistently Long: reach goal weight  Short : exercise consistently Long: reach goal weight      Core Components/Risk Factors/Patient Goals at Discharge (Final Review):  Goals and Risk Factor Review - 12/25/19 1723      Core Components/Risk Factors/Patient Goals Review   Personal Goals Review  Weight Management/Obesity;Hypertension;Lipids    Review  Kloe is taking all meds as directed.  Her weight is still coming down.  She does exercise at home.    Expected Outcomes  Short : exercise consistently Long: reach goal weight       ITP Comments: ITP Comments    Row Name 07/10/19 1139 07/24/19 1213 07/25/19 1119 08/05/19 1651 08/14/19 0958   ITP Comments  Completed virtutal orientation.  Documentation for diagnosis can be found in [facility] encounter 06/12/19.  EP eval scheduled for Thursday 11/5.  Pt  scheduled for holter monitor with follow up on 11/17.  Will check then to see when to start rehab officially.  Completed Initial RD eval  First full day of exercise!  Patient was oriented to gym and equipment including functions, settings, policies, and procedures.  Patient's individual exercise prescription and treatment plan were reviewed.  All starting workloads were established based on the results of the 6 minute walk test done at initial orientation visit.  The plan for exercise progression was also introduced and progression will be customized based on patient's performance and goals.  30 day review competed . ITP sent to Dr Emily Filbert for review, changes as needed and ITP approval signature.  New to program   Row Name 08/21/19 1711 09/11/19 0842 09/16/19 1711 10/09/19 1129 10/14/19 1629   ITP Comments  Reviewed home exercise with pt today.  Pt plans to use home equipment for exercise.  Reviewed THR, pulse, RPE, sign and symptoms, NTG use, and when to call 911 or MD.  Also discussed weather considerations and indoor options.  Pt voiced understanding.  30 day review competed . ITP sent to Dr Emily Filbert  for review, changes as needed and ITP approval signature New to program  Virtual follow up completed. No in person class this week. Cherl is doing well. She purchased some exercise equipment and is trying to get used to it. She is working on staying active while not in class. No other concerns noted.  30 day review completed. ITP sent to Dr. Emily Filbert, Medical Director of Cardiac and Pulmonary Rehab. Continue with ITP unless changes are made by physician.  Department operating under reduced schedule until further notice by request from hospital leadership.  Follow up call completed. Kenlyn is doing well, staying active. She is looking forward to coming next week to Cardiac Rehab.   [place] Name 11/06/19 8309 12/04/19 0620 01/01/20 0546 01/02/20 1711     ITP Comments  30 day chart review completed. ITP sent to Dr Zachery Dakins Medical Director, for review,changes as needed and signature.  30 day chart review completed. ITP sent to Dr Zachery Dakins Medical Director, for review,changes as needed and signature. Continue with ITP if no changes requested  30 Day review completed. Medical Director review done, changes made as directed,and approval shown by signature of Market researcher.  Emeline graduated today from  rehab with 36 sessions completed.  Details of the patient's exercise prescription and what She needs to do in order to continue the prescription and progress were discussed with patient.  Patient was given a copy of prescription and goals.  Patient verbalized understanding.  Allen plans to continue to exercise by walking and using elliptical at home.       Comments: discharge ITP

## 2020-01-02 NOTE — Patient Instructions (Signed)
Discharge Patient Instructions  Patient Details  Name: Tamara Villegas MRN: [id number] Date of Birth: 1945/12/05 Referring Provider:  Isaias Cowman, MD   Number of Visits: 26  Reason for Discharge:  Patient reached a stable level of exercise. Patient independent in their exercise. Patient has met program and personal goals.  Smoking History:  Social History   Tobacco Use  Smoking Status Former Smoker  Smokeless Tobacco Never Used  Tobacco Comment   as teenager    Diagnosis:  S/P coronary artery stent placement  Initial Exercise Prescription: Initial Exercise Prescription - 07/18/19 1400      Date of Initial Exercise RX and Referring Provider   Date  07/18/19    Referring Provider  Paraschos      Treadmill   MPH  2    Grade  0.5    Minutes  15    METs  2.6      Recumbant Bike   Level  2    RPM  60    Watts  18    Minutes  15    METs  2.6      NuStep   Level  2    SPM  80    Minutes  15    METs  2.6      REL-XR   Level  2    Speed  50    Minutes  15    METs  2.6      Biostep-RELP   Level  2    SPM  50    Minutes  15    METs  2.6      Prescription Details   Frequency (times per week)  3    Duration  Progress to 30 minutes of continuous aerobic without signs/symptoms of physical distress      Intensity   THRR 40-80% of Max Heartrate  98-130    Ratings of Perceived Exertion  11-15      Resistance Training   Training Prescription  Yes    Weight  4 lb    Reps  10-15       Discharge Exercise Prescription (Final Exercise Prescription Changes): Exercise Prescription Changes - 12/25/19 1500      Response to Exercise   Blood Pressure (Admit)  122/70    Blood Pressure (Exercise)  154/58    Blood Pressure (Exit)  134/82    Heart Rate (Admit)  78 bpm    Heart Rate (Exercise)  146 bpm    Heart Rate (Exit)  116 bpm    Rating of Perceived Exertion (Exercise)  16    Symptoms  none    Duration  Continue with 30 min of aerobic exercise without  signs/symptoms of physical distress.    Intensity  THRR unchanged   higher HR during weights not cardio     Progression   Progression  Continue to progress workloads to maintain intensity without signs/symptoms of physical distress.    Average METs  3.25      Resistance Training   Training Prescription  Yes    Weight  4 lb    Reps  10-15      Interval Training   Interval Training  No      NuStep   Level  4    SPM  80    Minutes  15    METs  3.5      Biostep-RELP   Level  2    SPM  50  Minutes  15    METs  3       Functional Capacity: 6 Minute Walk    Row Name 07/18/19 1428 07/18/19 1728 12/19/19 1724     6 Minute Walk   Phase  Initial  --  Discharge   Distance  1200 feet  1200 feet  1500 feet   Distance % Change  --  --  25 %   Distance Feet Change  --  --  300 ft   Walk Time  6 minutes  6 minutes  6 minutes   # of Rest Breaks  --  0  0   MPH  --  2.27  2.84   METS  --  2.68  3.71   RPE  --  17  15   Perceived Dyspnea   --  3  --   VO2 Peak  --  9.38  12.98   Symptoms  --  No  No   Resting HR  --  65 bpm  82 bpm   Resting BP  --  124/64  132/80   Max Ex. HR  --  111 bpm  130 bpm   Max Ex. BP  --  158/66  180/80   2 Minute Post BP  --  140/68  150/80      Quality of Life: Quality of Life - 07/18/19 1437      Quality of Life   Select  Quality of Life      Quality of Life Scores   Health/Function Pre  21.93 %    Socioeconomic Pre  28.75 %    Psych/Spiritual Pre  29.14 %    Family Pre  28.8 %    GLOBAL Pre  25.91 %       Personal Goals: Goals established at orientation with interventions provided to work toward goal. Personal Goals and Risk Factors at Admission - 07/18/19 1442      Core Components/Risk Factors/Patient Goals on Admission    Weight Management  Yes;Weight Loss    Intervention  Weight Management: Develop a combined nutrition and exercise program designed to reach desired caloric intake, while maintaining appropriate intake of  nutrient and fiber, sodium and fats, and appropriate energy expenditure required for the weight goal.;Weight Management: Provide education and appropriate resources to help participant work on and attain dietary goals.    Admit Weight  188 lb 6.4 oz (85.5 kg)    Goal Weight: Short Term  180 lb (81.6 kg)    Goal Weight: Long Term  175 lb (79.4 kg)    Expected Outcomes  Short Term: Continue to assess and modify interventions until short term weight is achieved;Long Term: Adherence to nutrition and physical activity/exercise program aimed toward attainment of established weight goal;Weight Loss: Understanding of general recommendations for a balanced deficit meal plan, which promotes 1-2 lb weight loss per week and includes a negative energy balance of [phone] kcal/d;Understanding recommendations for meals to include 15-35% energy as protein, 25-35% energy from fat, 35-60% energy from carbohydrates, less than '200mg'$  of dietary cholesterol, 20-35 gm of total fiber daily;Understanding of distribution of calorie intake throughout the day with the consumption of 4-5 meals/snacks    Intervention  Provide education on lifestyle modifcations including regular physical activity/exercise, weight management, moderate sodium restriction and increased consumption of fresh fruit, vegetables, and low fat dairy, alcohol moderation, and smoking cessation.;Monitor prescription use compliance.    Expected Outcomes  Short Term: Continued assessment and intervention until BP is <  140/29m HG in hypertensive participants. < 130/832mHG in hypertensive participants with diabetes, heart failure or chronic kidney disease.;Long Term: Maintenance of blood pressure at goal levels.        Personal Goals Discharge: Goals and Risk Factor Review - 12/25/19 1723      Core Components/Risk Factors/Patient Goals Review   Personal Goals Review  Weight Management/Obesity;Hypertension;Lipids    Review  Tamara Villegas taking all meds as directed.  Her  weight is still coming down.  She does exercise at home.    Expected Outcomes  Short : exercise consistently Long: reach goal weight       Exercise Goals and Review: Exercise Goals    Row Name 07/18/19 1443             Exercise Goals   Increase Physical Activity  Yes       Intervention  Provide advice, education, support and counseling about physical activity/exercise needs.;Develop an individualized exercise prescription for aerobic and resistive training based on initial evaluation findings, risk stratification, comorbidities and participant's personal goals.       Expected Outcomes  Short Term: Attend rehab on a regular basis to increase amount of physical activity.;Long Term: Add in home exercise to make exercise part of routine and to increase amount of physical activity.;Long Term: Exercising regularly at least 3-5 days a week.       Increase Strength and Stamina  Yes       Intervention  Provide advice, education, support and counseling about physical activity/exercise needs.;Develop an individualized exercise prescription for aerobic and resistive training based on initial evaluation findings, risk stratification, comorbidities and participant's personal goals.       Expected Outcomes  Short Term: Increase workloads from initial exercise prescription for resistance, speed, and METs.;Short Term: Perform resistance training exercises routinely during rehab and add in resistance training at home;Long Term: Improve cardiorespiratory fitness, muscular endurance and strength as measured by increased METs and functional capacity (6MWT)       Able to understand and use rate of perceived exertion (RPE) scale  Yes       Intervention  Provide education and explanation on how to use RPE scale       Expected Outcomes  Short Term: Able to use RPE daily in rehab to express subjective intensity level;Long Term:  Able to use RPE to guide intensity level when exercising independently       Knowledge and  understanding of Target Heart Rate Range (THRR)  Yes       Intervention  Provide education and explanation of THRR including how the numbers were predicted and where they are located for reference       Expected Outcomes  Short Term: Able to state/look up THRR;Short Term: Able to use daily as guideline for intensity in rehab;Long Term: Able to use THRR to govern intensity when exercising independently       Able to check pulse independently  Yes       Intervention  Provide education and demonstration on how to check pulse in carotid and radial arteries.;Review the importance of being able to check your own pulse for safety during independent exercise       Expected Outcomes  Short Term: Able to explain why pulse checking is important during independent exercise;Long Term: Able to check pulse independently and accurately       Understanding of Exercise Prescription  Yes       Intervention  Provide education, explanation, and written materials on  patient's individual exercise prescription       Expected Outcomes  Short Term: Able to explain program exercise prescription;Long Term: Able to explain home exercise prescription to exercise independently          Exercise Goals Re-Evaluation: Exercise Goals Re-Evaluation    Row Name 08/05/19 1652 08/21/19 1117 09/02/19 1700 09/03/19 1310 09/20/19 0932     Exercise Goal Re-Evaluation   Exercise Goals Review  Increase Physical Activity;Able to understand and use rate of perceived exertion (RPE) scale;Knowledge and understanding of Target Heart Rate Range (THRR);Understanding of Exercise Prescription;Increase Strength and Stamina;Able to check pulse independently  Increase Physical Activity;Increase Strength and Stamina;Understanding of Exercise Prescription  Increase Physical Activity;Increase Strength and Stamina;Understanding of Exercise Prescription;Able to understand and use rate of perceived exertion (RPE) scale  Increase Physical Activity;Increase  Strength and Stamina;Able to understand and use rate of perceived exertion (RPE) scale;Knowledge and understanding of Target Heart Rate Range (THRR);Able to check pulse independently;Understanding of Exercise Prescription  --   Comments  Reviewed RPE scale, THR and program prescription with pt today.  Pt voiced understanding and was given a copy of goals to take home.  Tamara Villegas is doing well in rehab.  She has not had any symptoms since staring.  She is now up to 2.0 mph on the treadmill. We will continue to monitor her progress.  Tamara Villegas has recently increased her NS level to 3 and her TM level to 3.0/3.0%. She reports her SOB, which was her main limiting factor, has improved and she is able to do more at home without limitations. She is able to walk faster and reports less fatigue. Tamara Villegas has an elliptical at home she plans to start using once cardiac rehab classes are cut back to do covid closure. She plans to exercise at home when she can not come into rehab. She was given our you tube channel information and plans to use those exercise and education videos as well.  --  Out since last review with the holidays   Expected Outcomes  Short: Use RPE daily to regulate intensity. Long: Follow program prescription in THR.  Short: Review home exercise guidelines.  Long: Continue to build stamina.  Short: Continue to attend cardiac rehab and exercise at home on off days. Long: maintain an independent exercise routine to help reduce cardiac risk factors.  --  --   Row Name 10/02/19 1315 10/16/19 1055 10/28/19 1710 11/13/19 1149 11/26/19 1544     Exercise Goal Re-Evaluation   Exercise Goals Review  Increase Physical Activity;Increase Strength and Stamina;Able to understand and use rate of perceived exertion (RPE) scale;Knowledge and understanding of Target Heart Rate Range (THRR);Able to check pulse independently;Understanding of Exercise Prescription  Increase Physical Activity;Increase Strength and Stamina;Understanding of  Exercise Prescription  Increase Physical Activity;Increase Strength and Stamina;Understanding of Exercise Prescription  Increase Physical Activity;Increase Strength and Stamina;Understanding of Exercise Prescription  Increase Physical Activity;Increase Strength and Stamina;Able to understand and use rate of perceived exertion (RPE) scale;Able to understand and use Dyspnea scale;Knowledge and understanding of Target Heart Rate Range (THRR);Able to check pulse independently;Understanding of Exercise Prescription   Comments  Tamara Villegas has increased incline on TM.  She uses 4 lb weights for strength work.  Staff will monitor progress.  Tamara Villegas continues to do well in rehab.  She is now up to level 2 on the XR.  We will continue to monitor her progress.  Tamara Villegas continues to attend Cardiac Rehab 2 days per week and exercises at home one  extra day outside of class. Encouraged her to add 2 days of home exercise outside of class for a total of 4 days a week. Tamara Villegas continues to progress with workloads and gaining strength and stamina.  Tamara Villegas has been doing well in rehab.  She is excited to get back to three days a week this week in class.  She is now up to 2.3 mph on the treadmill. We will continue to monitor her progress.  Tamara Villegas has missed a couple days due to a work conflict.She plans to return ASAP.   Expected Outcomes  Short :  continue to exercise consistently Long : maintain exercise on her own  Short: Continue to attend class regularly as we transition back to schedule.  Long: Continue to improve stamina.  Short: continue to attend Cardiac Rehab classes and add 1-2 days of exercise at home. Long: Become independent with exercise program.  Short: Increase REL workload  Long; Continue to improve stamina.  Short : get back to consistent attendance Long:  improve stamina   Row Name 12/09/19 1638 12/25/19 1558 12/25/19 1719         Exercise Goal Re-Evaluation   Exercise Goals Review  Increase Physical Activity;Increase Strength and  Stamina;Understanding of Exercise Prescription  Increase Physical Activity;Increase Strength and Stamina;Able to understand and use rate of perceived exertion (RPE) scale;Able to understand and use Dyspnea scale;Knowledge and understanding of Target Heart Rate Range (THRR);Able to check pulse independently;Understanding of Exercise Prescription  Increase Physical Activity;Increase Strength and Stamina;Able to understand and use rate of perceived exertion (RPE) scale;Able to understand and use Dyspnea scale;Knowledge and understanding of Target Heart Rate Range (THRR);Able to check pulse independently;Understanding of Exercise Prescription     Comments  Tamara Villegas is doing well in rehab. She is up to 3 METs on the BioStep.  We will continue to monitor her progress.  Tamara Villegas wil complete HT soon.  She improved her walk test 25%.  Tamara Villegas wil complete HT soon.  She improved her walk test 25%. Tamara Villegas reports exercising when on the weekends, but plans to workout during the week after HT. Tamara Villegas has an REL that she uses at home for 20 minutes. Tamara Villegas is also thinking about joining the [employer] and will do resistence during the week when done.     Expected Outcomes  Short: Continue to increase workloads  Long: Continue to improve stamina.  Short: complete program Long: maintain fitness on her own  Short: complete program Long: maintain fitness on her own        Nutrition & Weight - Outcomes:  Post Biometrics - 12/19/19 1727       Post  Biometrics   Height  5' 5.5" (1.664 m)    Weight  179 lb 14.4 oz (81.6 kg)    BMI (Calculated)  29.47    Single Leg Stand  3.16 seconds       Nutrition: Nutrition Therapy & Goals - 07/25/19 1100      Nutrition Therapy   Diet  low Na, HH    Protein (specify units)  65g    Fiber  25 grams    Whole Grain Foods  3 servings    Saturated Fats  12 max. grams    Fruits and Vegetables  5 servings/day    Sodium  1.5 grams      Personal Nutrition Goals   Nutrition Goal  ST: switch to whole grain  bread LT: get in better shape    Comments  B:  V8 can  grits and water L: sandwich w/ water or tea D: cheese and crackers or popcorn. weekend: [place] or [place], roast or potatoes. Office: sausage biscuit at [employer] w/ water L: sometimes no lunch bring egg salad sandwich, [place] and mayo sandwich, or out chick-fil-a. 3x 20 oz water/day. banana, apples, grapes, sweet potatoes, broccoli, salads, pinto or baked beans. Pt reports liking tuna fish in water. Discussed HH eating.      Intervention Plan   Intervention  Prescribe, educate and counsel regarding individualized specific dietary modifications aiming towards targeted core components such as weight, hypertension, lipid management, diabetes, heart failure and other comorbidities.;Nutrition handout(s) given to patient.    Expected Outcomes  Short Term Goal: Understand basic principles of dietary content, such as calories, fat, sodium, cholesterol and nutrients.;Short Term Goal: A plan has been developed with personal nutrition goals set during dietitian appointment.;Long Term Goal: Adherence to prescribed nutrition plan.       Nutrition Discharge: Nutrition Assessments - 07/18/19 1418      MEDFICTS Scores   Pre Score  42       Education Questionnaire Score: Knowledge Questionnaire Score - 07/18/19 1438      Knowledge Questionnaire Score   Pre Score  22/26 HF exercise nutrition       Goals reviewed with patient; copy given to patient.

## 2020-01-02 NOTE — Progress Notes (Signed)
Discharge Progress Report  Patient Details  Name: Tamara Villegas MRN: [id number] Date of Birth: November 01, 1945 Referring Provider:     Cardiac Rehab from 07/18/2019 in [facility]  Referring Provider  Paraschos       Number of Visits: 48  Reason for Discharge:  Patient reached a stable level of exercise. Patient independent in their exercise. Patient has met program and personal goals.  Smoking History:  Social History   Tobacco Use  Smoking Status Former Smoker  Smokeless Tobacco Never Used  Tobacco Comment   as teenager    Diagnosis:  S/P coronary artery stent placement  ADL [facility]:   Initial Exercise Prescription: Initial Exercise Prescription - 07/18/19 1400      Date of Initial Exercise RX and Referring Provider   Date  07/18/19    Referring Provider  Paraschos      Treadmill   MPH  2    Grade  0.5    Minutes  15    METs  2.6      Recumbant Bike   Level  2    RPM  60    Watts  18    Minutes  15    METs  2.6      NuStep   Level  2    SPM  80    Minutes  15    METs  2.6      REL-XR   Level  2    Speed  50    Minutes  15    METs  2.6      Biostep-RELP   Level  2    SPM  50    Minutes  15    METs  2.6      Prescription Details   Frequency (times per week)  3    Duration  Progress to 30 minutes of continuous aerobic without signs/symptoms of physical distress      Intensity   THRR 40-80% of Max Heartrate  98-130    Ratings of Perceived Exertion  11-15      Resistance Training   Training Prescription  Yes    Weight  4 lb    Reps  10-15       Discharge Exercise Prescription (Final Exercise Prescription Changes): Exercise Prescription Changes - 12/25/19 1500      Response to Exercise   Blood Pressure (Admit)  122/70    Blood Pressure (Exercise)  154/58    Blood Pressure (Exit)  134/82    Heart Rate (Admit)  78 bpm    Heart Rate (Exercise)  146 bpm    Heart Rate (Exit)  116 bpm    Rating of Perceived Exertion  (Exercise)  16    Symptoms  none    Duration  Continue with 30 min of aerobic exercise without signs/symptoms of physical distress.    Intensity  THRR unchanged   higher HR during weights not cardio     Progression   Progression  Continue to progress workloads to maintain intensity without signs/symptoms of physical distress.    Average METs  3.25      Resistance Training   Training Prescription  Yes    Weight  4 lb    Reps  10-15      Interval Training   Interval Training  No      NuStep   Level  4    SPM  80    Minutes  15  METs  3.5      Biostep-RELP   Level  2    SPM  50    Minutes  15    METs  3       Functional Capacity: 6 Minute Walk    Row Name 07/18/19 1428 07/18/19 1728 12/19/19 1724     6 Minute Walk   Phase  Initial  --  Discharge   Distance  1200 feet  1200 feet  1500 feet   Distance % Change  --  --  25 %   Distance Feet Change  --  --  300 ft   Walk Time  6 minutes  6 minutes  6 minutes   # of Rest Breaks  --  0  0   MPH  --  2.27  2.84   METS  --  2.68  3.71   RPE  --  17  15   Perceived Dyspnea   --  3  --   VO2 Peak  --  9.38  12.98   Symptoms  --  No  No   Resting HR  --  65 bpm  82 bpm   Resting BP  --  124/64  132/80   Max Ex. HR  --  111 bpm  130 bpm   Max Ex. BP  --  158/66  180/80   2 Minute Post BP  --  140/68  150/80      Psychological, QOL, Others - Outcomes: PHQ 2/9: Depression screen PHQ 2/9 07/18/2019  Decreased Interest 0  Down, Depressed, Hopeless 0  PHQ - 2 Score 0  Altered sleeping 0  Tired, decreased energy 3  Change in appetite 0  Feeling bad or failure about yourself  0  Trouble concentrating 0  Moving slowly or fidgety/restless 0  Suicidal thoughts 0  PHQ-9 Score 3  Difficult doing work/chores Not difficult at all    Quality of Life: Quality of Life - 07/18/19 1437      Quality of Life   Select  Quality of Life      Quality of Life Scores   Health/Function Pre  21.93 %    Socioeconomic Pre  28.75  %    Psych/Spiritual Pre  29.14 %    Family Pre  28.8 %    GLOBAL Pre  25.91 %        Nutrition & Weight - Outcomes:  Post Biometrics - 12/19/19 1727       Post  Biometrics   Height  5' 5.5" (1.664 m)    Weight  179 lb 14.4 oz (81.6 kg)    BMI (Calculated)  29.47    Single Leg Stand  3.16 seconds       Nutrition: Nutrition Therapy & Goals - 07/25/19 1100      Nutrition Therapy   Diet  low Na, HH    Protein (specify units)  65g    Fiber  25 grams    Whole Grain Foods  3 servings    Saturated Fats  12 max. grams    Fruits and Vegetables  5 servings/day    Sodium  1.5 grams      Personal Nutrition Goals   Nutrition Goal  ST: switch to whole grain bread LT: get in better shape    Comments  B: V8 can  grits and water L: sandwich w/ water or tea D: cheese and crackers or popcorn. weekend: [place] or [place], roast or potatoes. Office: sausage biscuit  at [employer] [facility] [facility] w/ water L: sometimes no lunch bring egg salad sandwich, [place] and mayo sandwich, or out chick-fil-a. 3x 20 oz water/day. banana, apples, grapes, sweet potatoes, broccoli, salads, pinto or baked beans. Pt reports liking tuna fish in water. Discussed HH eating.      Intervention Plan   Intervention  Prescribe, educate and counsel regarding individualized specific dietary modifications aiming towards targeted core components such as weight, hypertension, lipid management, diabetes, heart failure and other comorbidities.;Nutrition handout(s) given to patient.    Expected Outcomes  Short Term Goal: Understand basic principles of dietary content, such as calories, fat, sodium, cholesterol and nutrients.;Short Term Goal: A plan has been developed with personal nutrition goals set during dietitian appointment.;Long Term Goal: Adherence to prescribed nutrition plan.       Nutrition Discharge: Nutrition Assessments - 07/18/19 1418      MEDFICTS Scores   Pre Score  42       Education Questionnaire Score: Knowledge  Questionnaire Score - 07/18/19 1438      Knowledge Questionnaire Score   Pre Score  22/26 HF exercise nutrition       Goals reviewed with patient; copy given to patient.

## 2020-01-02 NOTE — Progress Notes (Signed)
Daily Session Note  Patient Details  Name: Tamara Villegas MRN: [id number] Date of Birth: [birth year] Referring Provider:     Cardiac Rehab from 07/18/2019 in [facility] Cardiac and Pulmonary Rehab  Referring Provider  Paras[place]      Encounter Date: 01/02/2020  Check In: Session Check In - 01/02/20 1710      Check-In   Supervising physician immediately available to respond to emergencies  See telemetry face sheet for immediately available ER MD    Location  [facility]-Cardiac & Pulmonary Rehab    Staff Present  Tamara Papa, RN BSN;Tamara Villegas, Tamara Villegas, ACSM CEP, Exercise Physiologist    Virtual Visit  No    Medication changes reported      No    Fall or balance concerns reported     No    Warm-up and Cool-down  Performed on first and last piece of equipment    Resistance Training Performed  Yes    VAD Patient?  No    PAD/SET Patient?  No      Pain Assessment   Currently in Pain?  No/denies          Social History   Tobacco Use  Smoking Status Former Smoker  Smokeless Tobacco Never Used  Tobacco Comment   as teenager    Goals Met:  Independence with exercise equipment Exercise tolerated well No report of cardiac concerns or symptoms Strength training completed today  Goals Unmet:  Not Applicable  Comments:  Tamara Villegas graduated today from  rehab with 36 sessions completed.  Details of the patient's exercise prescription and what She needs to do in order to continue the prescription and progress were discussed with patient.  Patient was given a copy of prescription and goals.  Patient verbalized understanding.  Tamara Villegas plans to continue to exercise by walking and using elliptical at home.     Dr. Emily Villegas is Medical Director for [facility].

## 2020-01-15 ENCOUNTER — Other Ambulatory Visit: Payer: Self-pay | Admitting: Internal Medicine

## 2020-01-15 DIAGNOSIS — Z1231 Encounter for screening mammogram for malignant neoplasm of breast: Secondary | ICD-10-CM

## 2020-03-03 ENCOUNTER — Other Ambulatory Visit: Payer: Self-pay

## 2020-03-03 ENCOUNTER — Ambulatory Visit
Admission: RE | Admit: 2020-03-03 | Discharge: 2020-03-03 | Disposition: A | Discharge status: Home / Self Care | Payer: BC Managed Care – PPO | Source: Ambulatory Visit | Attending: Internal Medicine | Admitting: Internal Medicine

## 2020-03-03 DIAGNOSIS — Z1231 Encounter for screening mammogram for malignant neoplasm of breast: Secondary | ICD-10-CM | POA: Diagnosis not present

## 2020-07-08 ENCOUNTER — Other Ambulatory Visit: Payer: Self-pay | Admitting: Podiatry

## 2020-07-08 DIAGNOSIS — S92252A Displaced fracture of navicular [scaphoid] of left foot, initial encounter for closed fracture: Secondary | ICD-10-CM

## 2020-07-16 ENCOUNTER — Other Ambulatory Visit: Payer: Self-pay

## 2020-07-16 ENCOUNTER — Ambulatory Visit
Admission: RE | Admit: 2020-07-16 | Discharge: 2020-07-16 | Disposition: A | Discharge status: Home / Self Care | Payer: BC Managed Care – PPO | Source: Ambulatory Visit | Attending: Podiatry | Admitting: Podiatry

## 2020-07-16 DIAGNOSIS — S92252A Displaced fracture of navicular [scaphoid] of left foot, initial encounter for closed fracture: Secondary | ICD-10-CM | POA: Diagnosis present

## 2020-07-28 ENCOUNTER — Other Ambulatory Visit: Payer: Self-pay | Admitting: Podiatry

## 2020-08-03 ENCOUNTER — Encounter
Admission: RE | Admit: 2020-08-03 | Discharge: 2020-08-03 | Disposition: A | Discharge status: Home / Self Care | Payer: BC Managed Care – PPO | Source: Ambulatory Visit | Attending: Podiatry | Admitting: Podiatry

## 2020-08-03 ENCOUNTER — Other Ambulatory Visit: Payer: Self-pay

## 2020-08-03 HISTORY — DX: Chronic kidney disease, unspecified: N18.9

## 2020-08-03 HISTORY — DX: Vitamin D deficiency, unspecified: E55.9

## 2020-08-03 HISTORY — DX: Type 2 diabetes mellitus without complications: E11.9

## 2020-08-03 HISTORY — DX: Unspecified glaucoma: H40.9

## 2020-08-03 HISTORY — DX: Occlusion and stenosis of bilateral carotid arteries: I65.23

## 2020-08-03 HISTORY — DX: Nonrheumatic aortic (valve) insufficiency: I35.1

## 2020-08-03 HISTORY — DX: Atherosclerotic heart disease of native coronary artery without angina pectoris: I25.10

## 2020-08-03 HISTORY — DX: Primary osteoarthritis, left ankle and foot: M19.072

## 2020-08-03 HISTORY — DX: Personal history of urinary calculi: Z87.442

## 2020-08-03 NOTE — Patient Instructions (Addendum)
Your procedure is scheduled on: Friday, December 3 Report to the Registration Desk on the 1st floor of the [facility]. To find out your arrival time, please call [phone] between 1PM - 3PM on: Thursday, December 2  REMEMBER: Instructions that are not followed completely may result in serious medical risk, up to and including death; or upon the discretion of your surgeon and anesthesiologist your surgery may need to be rescheduled.  Do not eat food after midnight the night before surgery.  No gum chewing, lozengers or hard candies.  You may however, drink water up to 2 hours before you are scheduled to arrive for your surgery. Do not drink anything within 2 hours of your scheduled arrival time.  In addition, your doctor has ordered for you to drink the provided  Gatorade G2 Drinking this carbohydrate drink up to two hours before surgery helps to reduce insulin resistance and improve patient outcomes. Please complete drinking 2 hours prior to scheduled arrival time.  TAKE THESE MEDICATIONS THE MORNING OF SURGERY WITH A SIP OF WATER:  1.  Duloxetine (Cymbalta) 2.  Timolol eye drops  Follow recommendations from Cardiologist regarding stopping Aspirin. Hold Aspirin 3 days prior to surgery. Last day to take is November 29. Resume 1 day after procedure.  One week prior to surgery: Starting November 26 Stop Anti-inflammatories (NSAIDS) such as Advil, Aleve, Ibuprofen, Motrin, Naproxen, Naprosyn and Aspirin based products such as Excedrin, Goodys Powder, BC Powder. Stop ANY OVER THE COUNTER supplements until after surgery.  (stop calcium, fish oil) (However, you may continue taking Vitamin D, and multivitamin up until the day before surgery.)  No Alcohol for 24 hours before or after surgery.  On the morning of surgery brush your teeth with toothpaste and water, you may rinse your mouth with mouthwash if you wish. Do not swallow any toothpaste or mouthwash.  Do not wear jewelry,  make-up, hairpins, clips or nail polish.  Do not wear lotions, powders, or perfumes.   Do not shave body from the neck down 48 hours prior to surgery just in case you cut yourself which could leave a site for infection.  Also, freshly shaved skin may become irritated if using the CHG soap.  Contact lenses, hearing aids and dentures may not be worn into surgery.  Do not bring valuables to the hospital. [facility] is not responsible for any missing/lost belongings or valuables.   Use CHG Soap as directed on instruction sheet.  Notify your doctor if there is any change in your medical condition (cold, fever, infection).  Wear comfortable clothing (specific to your surgery type) to the hospital.  Plan for stool softeners for home use; pain medications have a tendency to cause constipation. You can also help prevent constipation by eating foods high in fiber such as fruits and vegetables and drinking plenty of fluids as your diet allows.  After surgery, you can help prevent lung complications by doing breathing exercises.  Take deep breaths and cough every 1-2 hours. Your doctor may order a device called an Incentive Spirometer to help you take deep breaths.  If you are being discharged the day of surgery, you will not be allowed to drive home. You will need a responsible adult (18 years or older) to drive you home and stay with you that night.   If you are taking public transportation, you will need to have a responsible adult (18 years or older) with you. Please confirm with your physician that it is acceptable to  use public transportation.   Please call the [facility] at [phone] if you have any questions about these instructions.  Visitation Policy:  Patients undergoing a surgery or procedure may have one family member or support person with them as long as that person is not COVID-19 positive or experiencing its symptoms.  That person may remain in the  waiting area during the procedure.

## 2020-08-05 ENCOUNTER — Encounter
Admission: RE | Admit: 2020-08-05 | Discharge: 2020-08-05 | Disposition: A | Discharge status: Home / Self Care | Payer: BC Managed Care – PPO | Source: Ambulatory Visit | Attending: Podiatry | Admitting: Podiatry

## 2020-08-05 ENCOUNTER — Other Ambulatory Visit: Payer: Self-pay

## 2020-08-05 DIAGNOSIS — Z01818 Encounter for other preprocedural examination: Secondary | ICD-10-CM | POA: Diagnosis present

## 2020-08-05 DIAGNOSIS — I10 Essential (primary) hypertension: Secondary | ICD-10-CM | POA: Insufficient documentation

## 2020-08-05 DIAGNOSIS — I2510 Atherosclerotic heart disease of native coronary artery without angina pectoris: Secondary | ICD-10-CM | POA: Diagnosis not present

## 2020-08-05 DIAGNOSIS — Z01810 Encounter for preprocedural cardiovascular examination: Secondary | ICD-10-CM

## 2020-08-05 LAB — CBC
HCT: 37.8 % (ref 36.0–46.0)
Hemoglobin: 12.3 g/dL (ref 12.0–15.0)
MCH: 30.5 pg (ref 26.0–34.0)
MCHC: 32.5 g/dL (ref 30.0–36.0)
MCV: 93.8 fL (ref 80.0–100.0)
Platelets: 243 10*3/uL (ref 150–400)
RBC: 4.03 MIL/uL (ref 3.87–5.11)
RDW: 12.5 % (ref 11.5–15.5)
WBC: 8.5 10*3/uL (ref 4.0–10.5)
nRBC: 0 % (ref 0.0–0.2)

## 2020-08-05 LAB — BASIC METABOLIC PANEL
Anion gap: 10 (ref 5–15)
BUN: 16 mg/dL (ref 8–23)
CO2: 26 mmol/L (ref 22–32)
Calcium: 8.7 mg/dL — ABNORMAL LOW (ref 8.9–10.3)
Chloride: 103 mmol/L (ref 98–111)
Creatinine, Ser: 1.04 mg/dL — ABNORMAL HIGH (ref 0.44–1.00)
GFR, Estimated: 56 mL/min — ABNORMAL LOW (ref >60)
Glucose, Bld: 170 mg/dL — ABNORMAL HIGH (ref 70–99)
Potassium: 3.9 mmol/L (ref 3.5–5.1)
Sodium: 139 mmol/L (ref 135–145)

## 2020-08-10 NOTE — Progress Notes (Signed)
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 Clinical nursing staff has updated patient's PMH/PSHx, current medication list, and drug allergies/intolerances to ensure comprehensive history available to assist in medical decision making as it pertains to the aforementioned surgical procedure and anticipated anesthetic course.   Clinical Discussion:  Tamara Villegas is a 74 y.o. female who is submitted for pre-surgical anesthesia review and clearance prior to her undergoing the above procedure. Patient is a Former Research scientist (life sciences). Pertinent PMH includes: CAD, carotid atherosclerosis, non rheumatic aortic valve  insufficiency, HTN, HLD, T2DM, CKD-III, OA.  Patient is followed by cardiology Saralyn Pilar, MD). She was last seen in the cardiology clinic on 03/24/2020; notes reviewed.  At the time of her clinic visit, patient doing well from a cardiovascular perspective.  She denies chest pain, PND, orthopnea, peripheral edema, palpitations, vertiginous symptoms, or presyncope/syncope.  Patient has mild exertional shortness of breath.  Blood pressure well controlled on prescribed ACEi monotherapy.  She is on a statin for HLD.  TTE performed in 05/2019 revealed normal left ventricular systolic function with mild LVH; LVEF 50%.  Subsequent myocardial perfusion  imaging study revealed normal left ventricular function with mild to moderate anteroapical ischemia.  Diagnostic left heart cath revealed single-vessel CAD with 80% stenosis of the LAD; DES x 1 placed (see full interpretation of cardiovascular testing and treatment below).  As of 06/11/2020, patient will have been on DAPT (ASA + ticagrelor) therapy for 1 year. Cardiology gave patient clearance to discontinue ticagrelor as of 06/11/2020 with plans to continue daily low-dose ASA.  Patient follow-up with cardiology in 4 months.  Patient scheduled to undergo elective podiatric surgery on 08/14/2020 with Dr. [name].  Given patient's past medical history significant for cardiovascular issues, presurgical cardiac clearance was sought by the PAT team. Per cardiology, "this patient is optimized for surgery and may proceed with the planned procedural course with a LOW risk stratification". This patient is on daily antiplatelet therapy. She has been instructed on recommendations for holding her daily low-dose ASA for 3 days prior to her procedure. The patient has been instructed that her last dose of her anticoagulant will be on 09/09/2020.  She denies previous perioperative complications with anesthesia. She underwent a MAC anesthetic course at [facility] (ASA II) in 10/2016 with no documented complications.   Vitals with BMI 08/03/2020 12/19/2019 06/13/2019  Height 5' 5.5" 5' 5.5" -  Weight 185 lbs 179 lbs 14 oz -  BMI 19.62 22.97 -  Systolic - - 989  Diastolic - - 65  Pulse - - 82    Providers/Specialists:   NOTE: Primary physician provider listed below. Patient may have been seen by APP or partner within same practice.   PROVIDER ROLE LAST Montel Culver, DPM Podiatry (Surgeon)  07/27/2020  Ezequiel Kayser, MD Primary Care Provider  07/17/2020  Isaias Cowman, MD Cardiology  03/24/2020   Allergies:  Sudafed [pseudoephedrine]  Current Home Medications:   No current  facility-administered medications for this encounter.   Marland Kitchen aspirin 81 MG tablet  . atorvastatin (LIPITOR) 80 MG tablet  . Calcium Carb-Cholecalciferol (CALCIUM 600 + D PO)  . Cholecalciferol (VITAMIN D) 50 MCG (2000 UT) CAPS  . DULoxetine (CYMBALTA) 30 MG capsule  . ezetimibe (ZETIA) 10 MG tablet  . latanoprost (XALATAN) 0.005 % ophthalmic solution  . lisinopril (ZESTRIL) 20 MG tablet  . Multiple Vitamin (MULTIVITAMIN) tablet  . Omega-3 Fatty Acids (FISH OIL) 1200 MG CAPS  . timolol (BETIMOL) 0.5 % ophthalmic solution  . acetaminophen (TYLENOL) 325 MG tablet   History:   Past Medical History:  Diagnosis Date  . Adenomatous polyp of colon 07/04/2014  . Carotid atherosclerosis, bilateral   . Chronic kidney disease    stage 3  . Coronary artery disease   . Diabetes mellitus without complication (HCC)    diet control  . Glaucoma   . Hepatitis 1993   B.   . Herpes zoster infection of thoracic region 12/14/2016   left  . History of kidney stones   . Hypercholesteremia   . Hypertension   . Neuropathy    feet  . Nonrheumatic aortic valve insufficiency   . Osteoarthritis of left foot   . Vitamin D deficiency   . Wears dentures    full upper, partial lower   Past Surgical History:  Procedure Laterality Date  . ABDOMINAL HYSTERECTOMY  1975  . BREAST CYST ASPIRATION Left 1999   negative  . CARDIAC CATHETERIZATION    . CATARACT EXTRACTION W/PHACO Right 10/19/2016   Procedure: CATARACT EXTRACTION PHACO AND INTRAOCULAR LENS PLACEMENT ([place]) Right;  Surgeon: Leandrew Koyanagi, MD;  Location: [place];  Service: Ophthalmology;  Laterality: Right;  . CATARACT EXTRACTION W/PHACO Left 11/02/2016   Procedure: CATARACT EXTRACTION PHACO AND INTRAOCULAR LENS PLACEMENT ([place])  left;  Surgeon: Leandrew Koyanagi, MD;  Location: [place];  Service: Ophthalmology;  Laterality: Left;  . COLONOSCOPY  2007, 2017  . CORONARY ANGIOPLASTY    . CORONARY STENT INTERVENTION N/A  06/12/2019   Procedure: CORONARY STENT INTERVENTION;  Surgeon: Isaias Cowman, MD;  Location: [place] [facility];  Service: Cardiovascular;  Laterality: N/A;  . LEFT HEART CATH AND CORONARY ANGIOGRAPHY Left 06/12/2019   Procedure: LEFT HEART CATH AND CORONARY ANGIOGRAPHY;  Surgeon: Isaias Cowman, MD;  Location: [place] [facility];  Service: Cardiovascular;  Laterality: Left;  . TONSILLECTOMY     Family History  Problem Relation Age of Onset  . Breast cancer Daughter 52  . Breast cancer Maternal Aunt   . Kidney disease Mother   . Heart attack Father    Social History   Tobacco Use  . Smoking status: Former Smoker    Types: Cigarettes  . Smokeless tobacco: Never Used  . Tobacco comment: early 20's  Vaping Use  . Vaping Use: Never used  Substance Use Topics  . Alcohol use: Yes    Comment: rarely  . Drug use: Never    Pertinent Clinical Results:  LABS: Labs reviewed: Acceptable for surgery.  No visits with results within 3 Day(s) from this visit.  Latest known visit with results is:  Hospital Outpatient Visit on 08/05/2020  Component Date Value Ref Range Status  . Sodium 08/05/2020 139  135 - 145 mmol/L Final  . Potassium 08/05/2020 3.9  3.5 - 5.1 mmol/L Final  . Chloride 08/05/2020 103  98 - 111 mmol/L Final  . CO2 08/05/2020 26  22 - 32 mmol/L Final  . Glucose, Bld 08/05/2020 170* 70 - 99 mg/dL Final   Glucose reference range applies only to samples taken after fasting for at least 8 hours.  . BUN 08/05/2020 16  8 - 23 mg/dL Final  . Creatinine, Ser 08/05/2020 1.04* 0.44 - 1.00 mg/dL Final  . Calcium 08/05/2020 8.7* 8.9 - 10.3 mg/dL Final  . GFR, Estimated 08/05/2020 56* >60 mL/min Final   Comment: (NOTE) Calculated using the CKD-EPI Creatinine Equation (2021)   . Anion gap 08/05/2020 10  5 - 15 Final   Performed at [facility], [address] [place], [place] [address]  . WBC 08/05/2020 8.5  4.0 - 10.5 K/uL Final  . RBC 08/05/2020 4.03   3.87 - 5.11 MIL/uL Final  . Hemoglobin 08/05/2020 12.3  12.0 - 15.0 g/dL Final  . HCT 08/05/2020 37.8  36 - 46 % Final  . MCV 08/05/2020 93.8  80.0 - 100.0 fL Final  . MCH 08/05/2020 30.5  26.0 - 34.0 pg Final  . MCHC 08/05/2020 32.5  30.0 - 36.0 g/dL Final  . RDW 08/05/2020 12.5  11.5 - 15.5 % Final  . Platelets 08/05/2020 243  150 - 400 K/uL Final  . nRBC 08/05/2020 0.0  0.0 - 0.2 % Final   Performed at [facility], [address] [place], [place] [address]    ECG: Date: 08/05/2020 Time ECG obtained: 1500 PM Rate: 68 bpm Rhythm: normal sinus Axis (leads I and aVF): Normal Intervals: PR 130 ms. QRS 78 ms. QTc 421 ms. ST segment and T wave changes: Nonspecific ST abnormality Comparison: Similar to previous tracing obtained on 06/13/2019   IMAGING / PROCEDURES: LEFT HEART CATHETERIZATION AND CORONARY ANGIOGRAPHY done on 06/12/2019 1. One-vessel coronary artery disease with 80% stenosis mid LAD  Mid LAD lesion is 80% stenosed. 2. Normal left ventricular function 3. Mild to moderate aortic insufficiency  There is mild (2+) aortic regurgitation. 4. Successful PCI with DES mid LAD  A drug-eluting stent was successfully placed using a STENT RESOLUTE ONYX 2.5X12.  Post intervention, there is a 0% residual stenosis.  LEXISCAN done on 05/28/2019 1. LVEF 67% 2. Regional wall motion reveals normal myocardial thickening and wall motion 3. Small perfusion abnormality of mild intensity is present in the anteroapical region 4. There are no artifacts noted 5. Left ventricular cavity size normal 6. The overall quality of the study is good  ECHOCARDIOGRAM done on 05/28/2019 1. LVEF 50% 2. Normal left ventricular systolic function with mild LVH 3. Normal right ventricular systolic function 4. Moderate to severe AR; mild MR, TR, PR 5. No valvular stenosis   Impression and Plan:  Tamara Villegas has been referred for pre-anesthesia review and clearance prior to her  undergoing the planned anesthetic and procedural courses. Available labs, pertinent testing, and imaging results were personally reviewed by me. This patient has been appropriately cleared by cardiology.   Based on clinical review performed today (08/10/20), barring any significant acute changes in the patient's overall condition, it is anticipated that she will be able to proceed with the planned surgical intervention. Any acute changes in clinical condition may necessitate her procedure being postponed and/or cancelled. Pre-surgical instructions were reviewed with the patient during her PAT appointment and questions were fielded by PAT clinical staff.  Honor Loh, MSN, APRN, FNP-C, [place] Nurse Practitioner Phone: [phone] 08/10/20 3:25 PM  NOTE: This note has been prepared using Dragon dictation software. Despite my best ability to proofread, there is always the potential that unintentional transcriptional errors may still occur from this process.

## 2020-08-12 ENCOUNTER — Other Ambulatory Visit
Admission: RE | Admit: 2020-08-12 | Discharge: 2020-08-12 | Disposition: A | Discharge status: Home / Self Care | Payer: BC Managed Care – PPO | Source: Ambulatory Visit | Attending: Podiatry | Admitting: Podiatry

## 2020-08-12 ENCOUNTER — Other Ambulatory Visit: Payer: Self-pay

## 2020-08-12 DIAGNOSIS — Z888 Allergy status to other drugs, medicaments and biological substances status: Secondary | ICD-10-CM | POA: Diagnosis not present

## 2020-08-12 DIAGNOSIS — X58XXXD Exposure to other specified factors, subsequent encounter: Secondary | ICD-10-CM | POA: Diagnosis not present

## 2020-08-12 DIAGNOSIS — Z79899 Other long term (current) drug therapy: Secondary | ICD-10-CM | POA: Diagnosis not present

## 2020-08-12 DIAGNOSIS — Z7982 Long term (current) use of aspirin: Secondary | ICD-10-CM | POA: Diagnosis not present

## 2020-08-12 DIAGNOSIS — S92252P Displaced fracture of navicular [scaphoid] of left foot, subsequent encounter for fracture with malunion: Secondary | ICD-10-CM | POA: Diagnosis not present

## 2020-08-12 DIAGNOSIS — Z01812 Encounter for preprocedural laboratory examination: Secondary | ICD-10-CM | POA: Insufficient documentation

## 2020-08-12 DIAGNOSIS — Z20822 Contact with and (suspected) exposure to covid-19: Secondary | ICD-10-CM | POA: Insufficient documentation

## 2020-08-12 DIAGNOSIS — Z955 Presence of coronary angioplasty implant and graft: Secondary | ICD-10-CM | POA: Diagnosis not present

## 2020-08-12 DIAGNOSIS — Z7722 Contact with and (suspected) exposure to environmental tobacco smoke (acute) (chronic): Secondary | ICD-10-CM | POA: Diagnosis not present

## 2020-08-12 DIAGNOSIS — Z961 Presence of intraocular lens: Secondary | ICD-10-CM | POA: Diagnosis not present

## 2020-08-12 DIAGNOSIS — Z9842 Cataract extraction status, left eye: Secondary | ICD-10-CM | POA: Diagnosis not present

## 2020-08-12 DIAGNOSIS — Z9841 Cataract extraction status, right eye: Secondary | ICD-10-CM | POA: Diagnosis not present

## 2020-08-12 DIAGNOSIS — M19172 Post-traumatic osteoarthritis, left ankle and foot: Secondary | ICD-10-CM | POA: Diagnosis present

## 2020-08-12 LAB — SARS CORONAVIRUS 2 (TAT 6-24 HRS): SARS Coronavirus 2: NEGATIVE

## 2020-08-13 MED ORDER — CHLORHEXIDINE GLUCONATE 0.12 % MT SOLN: 15.0000 mL | Freq: Once | OROMUCOSAL | Status: AC

## 2020-08-13 MED ORDER — SODIUM CHLORIDE 0.9 % IV SOLN: INTRAVENOUS | Status: DC

## 2020-08-13 MED ORDER — FAMOTIDINE 20 MG PO TABS: 20.0000 mg | ORAL_TABLET | Freq: Once | ORAL | Status: AC

## 2020-08-13 MED ORDER — POVIDONE-IODINE 10 % EX SWAB
2.0000 "application " | Freq: Once | CUTANEOUS | Status: AC
  Administered 2020-08-14: 2 via TOPICAL

## 2020-08-13 MED ORDER — CEFAZOLIN SODIUM-DEXTROSE 2-4 GM/100ML-% IV SOLN
2.0000 g | INTRAVENOUS | Status: AC
  Administered 2020-08-14: 2 g via INTRAVENOUS

## 2020-08-13 MED ORDER — ORAL CARE MOUTH RINSE: 15.0000 mL | Freq: Once | OROMUCOSAL | Status: AC

## 2020-08-14 ENCOUNTER — Encounter
Admission: RE | Disposition: A | Discharge status: Home / Self Care | Payer: Self-pay | Source: Home / Self Care | Attending: Podiatry

## 2020-08-14 ENCOUNTER — Ambulatory Visit: Payer: BC Managed Care – PPO

## 2020-08-14 ENCOUNTER — Ambulatory Visit: Payer: BC Managed Care – PPO | Admitting: Urgent Care

## 2020-08-14 ENCOUNTER — Ambulatory Visit
Admission: RE | Admit: 2020-08-14 | Discharge: 2020-08-14 | Disposition: A | Discharge status: Home / Self Care | Payer: BC Managed Care – PPO | Attending: Podiatry | Admitting: Podiatry

## 2020-08-14 ENCOUNTER — Encounter: Payer: Self-pay | Admitting: Podiatry

## 2020-08-14 ENCOUNTER — Other Ambulatory Visit: Payer: Self-pay

## 2020-08-14 DIAGNOSIS — Z20822 Contact with and (suspected) exposure to covid-19: Secondary | ICD-10-CM | POA: Insufficient documentation

## 2020-08-14 DIAGNOSIS — Z419 Encounter for procedure for purposes other than remedying health state, unspecified: Secondary | ICD-10-CM

## 2020-08-14 DIAGNOSIS — Z888 Allergy status to other drugs, medicaments and biological substances status: Secondary | ICD-10-CM | POA: Insufficient documentation

## 2020-08-14 DIAGNOSIS — Z9842 Cataract extraction status, left eye: Secondary | ICD-10-CM | POA: Insufficient documentation

## 2020-08-14 DIAGNOSIS — Z7982 Long term (current) use of aspirin: Secondary | ICD-10-CM | POA: Insufficient documentation

## 2020-08-14 DIAGNOSIS — Z7722 Contact with and (suspected) exposure to environmental tobacco smoke (acute) (chronic): Secondary | ICD-10-CM | POA: Insufficient documentation

## 2020-08-14 DIAGNOSIS — S92252P Displaced fracture of navicular [scaphoid] of left foot, subsequent encounter for fracture with malunion: Secondary | ICD-10-CM | POA: Insufficient documentation

## 2020-08-14 DIAGNOSIS — Z961 Presence of intraocular lens: Secondary | ICD-10-CM | POA: Insufficient documentation

## 2020-08-14 DIAGNOSIS — M19172 Post-traumatic osteoarthritis, left ankle and foot: Secondary | ICD-10-CM | POA: Insufficient documentation

## 2020-08-14 DIAGNOSIS — Z79899 Other long term (current) drug therapy: Secondary | ICD-10-CM | POA: Insufficient documentation

## 2020-08-14 DIAGNOSIS — Z955 Presence of coronary angioplasty implant and graft: Secondary | ICD-10-CM | POA: Insufficient documentation

## 2020-08-14 DIAGNOSIS — X58XXXD Exposure to other specified factors, subsequent encounter: Secondary | ICD-10-CM | POA: Insufficient documentation

## 2020-08-14 DIAGNOSIS — Z9841 Cataract extraction status, right eye: Secondary | ICD-10-CM | POA: Insufficient documentation

## 2020-08-14 HISTORY — PX: FUSION OF TALONAVICULAR JOINT: SHX6332

## 2020-08-14 HISTORY — PX: FOOT ARTHRODESIS: SHX1655

## 2020-08-14 LAB — GLUCOSE, CAPILLARY: Glucose-Capillary: 171 mg/dL — ABNORMAL HIGH (ref 70–99)

## 2020-08-14 SURGERY — FUSION, JOINT, FOOT
Anesthesia: General | Site: Foot | Laterality: Left

## 2020-08-14 MED ORDER — MIDAZOLAM HCL 2 MG/2ML IJ SOLN
INTRAMUSCULAR | Status: DC | PRN
  Administered 2020-08-14: 1 mg via INTRAVENOUS

## 2020-08-14 MED ORDER — DEXMEDETOMIDINE (PRECEDEX) IN NS 20 MCG/5ML (4 MCG/ML) IV SYRINGE
PREFILLED_SYRINGE | INTRAVENOUS | Status: DC | PRN
  Administered 2020-08-14: 4 ug via INTRAVENOUS

## 2020-08-14 MED ORDER — PROPOFOL 10 MG/ML IV BOLUS
INTRAVENOUS | Status: AC
  Filled 2020-08-14: qty 20

## 2020-08-14 MED ORDER — ACETAMINOPHEN 10 MG/ML IV SOLN
INTRAVENOUS | Status: AC
  Filled 2020-08-14: qty 100

## 2020-08-14 MED ORDER — ONDANSETRON HCL 4 MG/2ML IJ SOLN: 4.0000 mg | Freq: Once | INTRAMUSCULAR | Status: DC | PRN

## 2020-08-14 MED ORDER — SEVOFLURANE IN SOLN
RESPIRATORY_TRACT | Status: AC
  Filled 2020-08-14: qty 250

## 2020-08-14 MED ORDER — METOCLOPRAMIDE HCL 10 MG PO TABS: 5.0000 mg | ORAL_TABLET | Freq: Three times a day (TID) | ORAL | Status: DC | PRN

## 2020-08-14 MED ORDER — ACETAMINOPHEN 10 MG/ML IV SOLN
INTRAVENOUS | Status: DC | PRN
  Administered 2020-08-14: 1000 mg via INTRAVENOUS

## 2020-08-14 MED ORDER — CEFAZOLIN SODIUM-DEXTROSE 2-4 GM/100ML-% IV SOLN
INTRAVENOUS | Status: AC
  Filled 2020-08-14: qty 100

## 2020-08-14 MED ORDER — THROMBIN 5000 UNITS EX SOLR
CUTANEOUS | Status: AC
  Filled 2020-08-14: qty 5000

## 2020-08-14 MED ORDER — DEXAMETHASONE SODIUM PHOSPHATE 10 MG/ML IJ SOLN
INTRAMUSCULAR | Status: DC | PRN
  Administered 2020-08-14: 5 mg via INTRAVENOUS

## 2020-08-14 MED ORDER — METOCLOPRAMIDE HCL 5 MG/ML IJ SOLN: 5.0000 mg | Freq: Three times a day (TID) | INTRAMUSCULAR | Status: DC | PRN

## 2020-08-14 MED ORDER — PHENYLEPHRINE HCL (PRESSORS) 10 MG/ML IV SOLN
INTRAVENOUS | Status: DC | PRN
  Administered 2020-08-14 (×5): 100 ug via INTRAVENOUS
  Administered 2020-08-14 (×2): 200 ug via INTRAVENOUS

## 2020-08-14 MED ORDER — ONDANSETRON HCL 4 MG/2ML IJ SOLN: 4.0000 mg | Freq: Four times a day (QID) | INTRAMUSCULAR | Status: DC | PRN

## 2020-08-14 MED ORDER — SODIUM CHLORIDE 0.9 % IV SOLN
INTRAVENOUS | Status: DC | PRN
  Administered 2020-08-14: 20 ug/min via INTRAVENOUS

## 2020-08-14 MED ORDER — EPHEDRINE SULFATE 50 MG/ML IJ SOLN
INTRAMUSCULAR | Status: DC | PRN
  Administered 2020-08-14 (×5): 10 mg via INTRAVENOUS

## 2020-08-14 MED ORDER — DEXMEDETOMIDINE (PRECEDEX) IN NS 20 MCG/5ML (4 MCG/ML) IV SYRINGE
PREFILLED_SYRINGE | INTRAVENOUS | Status: AC
  Filled 2020-08-14: qty 5

## 2020-08-14 MED ORDER — LIDOCAINE HCL (PF) 2 % IJ SOLN
INTRAMUSCULAR | Status: AC
  Filled 2020-08-14: qty 5

## 2020-08-14 MED ORDER — BUPIVACAINE HCL (PF) 0.25 % IJ SOLN
INTRAMUSCULAR | Status: DC | PRN
  Administered 2020-08-14: 22 mL

## 2020-08-14 MED ORDER — DEXAMETHASONE SODIUM PHOSPHATE 10 MG/ML IJ SOLN
INTRAMUSCULAR | Status: AC
  Filled 2020-08-14: qty 1

## 2020-08-14 MED ORDER — FAMOTIDINE 20 MG PO TABS
ORAL_TABLET | ORAL | Status: AC
  Administered 2020-08-14: 20 mg via ORAL
  Filled 2020-08-14: qty 1

## 2020-08-14 MED ORDER — OXYCODONE-ACETAMINOPHEN 5-325 MG PO TABS
1.0000 | ORAL_TABLET | Freq: Four times a day (QID) | ORAL | 0 refills | Status: DC | PRN
Start: 2020-08-14 — End: 2021-04-27

## 2020-08-14 MED ORDER — BUPIVACAINE LIPOSOME 1.3 % IJ SUSP
INTRAMUSCULAR | Status: DC | PRN
  Administered 2020-08-14: 20 mL

## 2020-08-14 MED ORDER — CHLORHEXIDINE GLUCONATE 0.12 % MT SOLN
OROMUCOSAL | Status: AC
  Administered 2020-08-14: 15 mL via OROMUCOSAL
  Filled 2020-08-14: qty 15

## 2020-08-14 MED ORDER — ONDANSETRON HCL 4 MG PO TABS: 4.0000 mg | ORAL_TABLET | Freq: Four times a day (QID) | ORAL | Status: DC | PRN

## 2020-08-14 MED ORDER — ONDANSETRON HCL 4 MG/2ML IJ SOLN
INTRAMUSCULAR | Status: AC
  Filled 2020-08-14: qty 2

## 2020-08-14 MED ORDER — PROPOFOL 10 MG/ML IV BOLUS
INTRAVENOUS | Status: DC | PRN
  Administered 2020-08-14: 150 mg via INTRAVENOUS
  Administered 2020-08-14 (×2): 50 mg via INTRAVENOUS

## 2020-08-14 MED ORDER — LIDOCAINE HCL (CARDIAC) PF 100 MG/5ML IV SOSY
PREFILLED_SYRINGE | INTRAVENOUS | Status: DC | PRN
  Administered 2020-08-14: 60 mg via INTRAVENOUS

## 2020-08-14 MED ORDER — ONDANSETRON HCL 4 MG/2ML IJ SOLN
INTRAMUSCULAR | Status: DC | PRN
  Administered 2020-08-14: 4 mg via INTRAVENOUS

## 2020-08-14 MED ORDER — EPHEDRINE 5 MG/ML INJ
INTRAVENOUS | Status: AC
  Filled 2020-08-14: qty 10

## 2020-08-14 MED ORDER — FENTANYL CITRATE (PF) 100 MCG/2ML IJ SOLN
INTRAMUSCULAR | Status: AC
  Filled 2020-08-14: qty 2

## 2020-08-14 MED ORDER — MIDAZOLAM HCL 2 MG/2ML IJ SOLN
INTRAMUSCULAR | Status: AC
  Filled 2020-08-14: qty 2

## 2020-08-14 MED ORDER — SODIUM CHLORIDE 0.9 % IR SOLN
Status: DC | PRN
  Administered 2020-08-14: 1000 mL

## 2020-08-14 MED ORDER — FENTANYL CITRATE (PF) 100 MCG/2ML IJ SOLN: 25.0000 ug | INTRAMUSCULAR | Status: DC | PRN

## 2020-08-14 MED ORDER — FENTANYL CITRATE (PF) 100 MCG/2ML IJ SOLN
INTRAMUSCULAR | Status: DC | PRN
  Administered 2020-08-14 (×4): 25 ug via INTRAVENOUS

## 2020-08-14 SURGICAL SUPPLY — 81 items
2.5 DRILL ×3 IMPLANT
ARCUS STAPLE SYSTEM ×3 IMPLANT
BASIN GRAD PLASTIC 32OZ STRL (MISCELLANEOUS) ×3 IMPLANT
BIT DRILL CAL 2.5 ST W/SLV (BIT) ×3 IMPLANT
BLADE SURG 15 STRL LF DISP TIS (BLADE) ×6 IMPLANT
BLADE SURG 15 STRL SS (BLADE) ×3
BLADE SURG MINI STRL (BLADE) ×3 IMPLANT
BNDG COHESIVE 4X5 TAN STRL (GAUZE/BANDAGES/DRESSINGS) ×3 IMPLANT
BNDG CONFORM 2 STRL LF (GAUZE/BANDAGES/DRESSINGS) ×3 IMPLANT
BNDG CONFORM 3 STRL LF (GAUZE/BANDAGES/DRESSINGS) ×3 IMPLANT
BNDG ELASTIC 4X5.8 VLCR NS LF (GAUZE/BANDAGES/DRESSINGS) ×6 IMPLANT
BNDG ESMARK 4X12 TAN STRL LF (GAUZE/BANDAGES/DRESSINGS) ×3 IMPLANT
BNDG GAUZE 4.5X4.1 6PLY STRL (MISCELLANEOUS) ×3 IMPLANT
BOOT STEPPER DURA MED (SOFTGOODS) ×3 IMPLANT
BUR 4X45 EGG (BURR) ×3 IMPLANT
COVER WAND RF STERILE (DRAPES) ×3 IMPLANT
CUFF TOURN SGL QUICK 24 (TOURNIQUET CUFF) ×1
CUFF TRNQT CYL 24X4X16.5-23 (TOURNIQUET CUFF) ×2 IMPLANT
DRAPE C-ARM XRAY 36X54 (DRAPES) ×3 IMPLANT
DRAPE C-ARMOR (DRAPES) ×3 IMPLANT
DRAPE FLUOR MINI C-ARM 54X84 (DRAPES) ×3 IMPLANT
DRAPE INCISE IOBAN 66X45 STRL (DRAPES) ×3 IMPLANT
DRAPE SURG 17X11 SM STRL (DRAPES) ×3 IMPLANT
DRIVER STRM ST T10 (ORTHOPEDIC DISPOSABLE SUPPLIES) ×3 IMPLANT
DURAPREP 26ML APPLICATOR (WOUND CARE) ×3 IMPLANT
ELECT REM PT RETURN 9FT ADLT (ELECTROSURGICAL) ×3
ELECTRODE REM PT RTRN 9FT ADLT (ELECTROSURGICAL) ×2 IMPLANT
GAUZE SPONGE 4X4 12PLY STRL (GAUZE/BANDAGES/DRESSINGS) ×3 IMPLANT
GAUZE XEROFORM 1X8 LF (GAUZE/BANDAGES/DRESSINGS) ×6 IMPLANT
GLOVE BIO SURGEON STRL SZ7.5 (GLOVE) ×3 IMPLANT
GLOVE INDICATOR 8.0 STRL GRN (GLOVE) ×3 IMPLANT
GOWN STRL REUS W/ TWL XL LVL3 (GOWN DISPOSABLE) ×4 IMPLANT
GOWN STRL REUS W/TWL XL LVL3 (GOWN DISPOSABLE) ×2
GRAFT TRIN ELITE MED MUSC TRAN (Graft) ×3 IMPLANT
HEMOSTAT SURGICEL 2X3 (HEMOSTASIS) ×3 IMPLANT
K-WIRE 1.6 (WIRE) ×9 IMPLANT
KIT ARCUS SIZING TEMPLATE STRL (KITS) ×3 IMPLANT
KIT STAPLE ARCUS 20X17 STRL (Staple) ×3 IMPLANT
KIT STRATUM INSTRUMENT STD (KITS) ×3 IMPLANT
KIT TURNOVER KIT A (KITS) ×3 IMPLANT
MANIFOLD NEPTUNE II (INSTRUMENTS) ×3 IMPLANT
NEEDLE FILTER BLUNT 18X 1/2SAF (NEEDLE) ×1
NEEDLE FILTER BLUNT 18X1 1/2 (NEEDLE) ×2 IMPLANT
NEEDLE HYPO 25X1 1.5 SAFETY (NEEDLE) ×6 IMPLANT
NS IRRIG 1000ML POUR BTL (IV SOLUTION) ×3 IMPLANT
NS IRRIG 500ML POUR BTL (IV SOLUTION) ×3 IMPLANT
NYLON 3.0 FS-1 ×15 IMPLANT
PACK EXTREMITY (MISCELLANEOUS) ×3 IMPLANT
PAD ABD DERMACEA PRESS 5X9 (GAUZE/BANDAGES/DRESSINGS) ×3 IMPLANT
PADDING CAST BLEND 4X4 NS (MISCELLANEOUS) ×3 IMPLANT
PENCIL ELECTRO HAND CTR (MISCELLANEOUS) ×3 IMPLANT
PLATE MCF STRM SM LT (Plate) ×3 IMPLANT
PUTTY DBX 5CC (Putty) ×3 IMPLANT
SCREW  STRATUM NL LP ST 3.5X18 (Screw) ×1 IMPLANT
SCREW LOCK STRATUM 3.5X16 (Screw) ×3 IMPLANT
SCREW LOCK STRATUM 3.5X18 (Screw) ×6 IMPLANT
SCREW LP STRM NL 3.5X26 ST (Screw) ×3 IMPLANT
SCREW MDS 3.5X14 STRL (Screw) ×3 IMPLANT
SCREW MDS ST STRM 3.5X16 (Screw) ×3 IMPLANT
SCREW MDS ST STRM 3.5X18 (Screw) ×3 IMPLANT
SCREW NL LP STRATUM 3.5X16 (Screw) ×3 IMPLANT
SCREW STRATUM NL LP ST 3.5X18 (Screw) ×2 IMPLANT
SOL PREP PVP 2OZ (MISCELLANEOUS) ×3
SOLUTION PREP PVP 2OZ (MISCELLANEOUS) ×2 IMPLANT
STAPLE ASSEMBLY ARCUS 20X17 (Staple) ×3 IMPLANT
STOCKINETTE M/LG 89821 (MISCELLANEOUS) ×3 IMPLANT
STOCKINETTE STRL 6IN 960660 (GAUZE/BANDAGES/DRESSINGS) ×3 IMPLANT
STRIP CLOSURE SKIN 1/4X4 (GAUZE/BANDAGES/DRESSINGS) ×3 IMPLANT
SUT ETHILON 3-0 FS-10 30 BLK (SUTURE) ×27
SUT ETHILON 4-0 (SUTURE) ×1
SUT ETHILON 4-0 FS2 18XMFL BLK (SUTURE) ×2
SUT ETHILON 5-0 FS-2 18 BLK (SUTURE) ×3 IMPLANT
SUT VIC AB 2-0 CT2 27 (SUTURE) ×6 IMPLANT
SUT VIC AB 3-0 SH 27 (SUTURE) ×1
SUT VIC AB 3-0 SH 27X BRD (SUTURE) ×2 IMPLANT
SUT VIC AB 4-0 FS2 27 (SUTURE) ×3 IMPLANT
SUTURE EHLN 3-0 FS-10 30 BLK (SUTURE) ×18 IMPLANT
SUTURE ETHLN 4-0 FS2 18XMF BLK (SUTURE) ×2 IMPLANT
SYR 10ML LL (SYRINGE) ×3 IMPLANT
TOWEL OR 17X26 4PK STRL BLUE (TOWEL DISPOSABLE) ×3 IMPLANT
WIRE Z .062 C-WIRE SPADE TIP (WIRE) ×9 IMPLANT

## 2020-08-14 NOTE — Transfer of Care (Signed)
Immediate Anesthesia Transfer of Care Note  Patient: Tamara Villegas  Procedure(s) Performed: ARTHRODESIS FOOT  TRIPLE LEFT (Left Foot) FUSION OF MIDFOOT LEFT (Left )  Patient Location: PACU  Anesthesia Type:General  Level of Consciousness: awake, alert  and oriented  Airway & Oxygen Therapy: Patient Spontanous Breathing and Patient connected to face mask oxygen  Post-op Assessment: Report given to RN and Post -op Vital signs reviewed and stable  Post vital signs: Reviewed and stable  Last Vitals:  Vitals Value Taken Time  BP 138/60 08/14/20 1440  Temp 36.5 C 08/14/20 1440  Pulse 93 08/14/20 1451  Resp 19 08/14/20 1451  SpO2 97 % 08/14/20 1451  Vitals shown include unvalidated device data.  Last Pain:  Vitals:   08/14/20 1440  TempSrc:   PainSc: Asleep         Complications: No complications documented.

## 2020-08-14 NOTE — Anesthesia Postprocedure Evaluation (Addendum)
Anesthesia Post Note  Patient: Tamara Villegas  Procedure(s) Performed: ARTHRODESIS FOOT  TRIPLE LEFT (Left Foot) FUSION OF MIDFOOT LEFT (Left )  Patient location during evaluation: PACU Anesthesia Type: General Level of consciousness: awake and alert Pain management: pain level controlled Vital Signs Assessment: post-procedure vital signs reviewed and stable Respiratory status: spontaneous breathing and respiratory function stable Cardiovascular status: stable Anesthetic complications: no Comments: Pt with possible aspiration during case. In PACU on RA with saturations in the upper 90s. Will keep for at least another hour. Dr. Kayleen Memos to evaluate prior to discharge.    No complications documented.   Last Vitals:  Vitals:   08/14/20 1525 08/14/20 1540  BP: 110/63 118/67  Pulse: 92 91  Resp: 16 16  Temp:    SpO2: 95% 95%    Last Pain:  Vitals:   08/14/20 1540  TempSrc:   PainSc: 0-No pain                 Tamara Villegas

## 2020-08-14 NOTE — Progress Notes (Signed)
Dr. Kayleen Memos came and evaluated pt. Stated pt looked good and ready to go home.

## 2020-08-14 NOTE — Anesthesia Preprocedure Evaluation (Signed)
Anesthesia Evaluation  Patient identified by MRN, date of birth, ID band Patient awake    Reviewed: Allergy & Precautions, NPO status , Patient's Chart, lab work & pertinent test results  History of Anesthesia Complications Negative for: history of anesthetic complications  Airway Mallampati: III       Dental   Pulmonary neg sleep apnea, neg COPD, Not current smoker, former smoker,           Cardiovascular hypertension, Pt. on medications + Cardiac Stents  (-) Past MI and (-) CHF (-) dysrhythmias (-) Valvular Problems/Murmurs     Neuro/Psych neg Seizures    GI/Hepatic neg GERD  ,(+) Hepatitis -, B  Endo/Other  diabetes, Type 2  Renal/GU Renal InsufficiencyRenal disease     Musculoskeletal   Abdominal   Peds  Hematology   Anesthesia Other Findings   Reproductive/Obstetrics                             Anesthesia Physical Anesthesia Plan  ASA: III  Anesthesia Plan: General   Post-op Pain Management:    Induction: Intravenous  PONV Risk Score and Plan: 3 and Ondansetron, Dexamethasone and Treatment may vary due to age or medical condition  Airway Management Planned: LMA  Additional Equipment:   Intra-op Plan:   Post-operative Plan:   Informed Consent: I have reviewed the patients History and Physical, chart, labs and discussed the procedure including the risks, benefits and alternatives for the proposed anesthesia with the patient or authorized representative who has indicated his/her understanding and acceptance.       Plan Discussed with:   Anesthesia Plan Comments:         Anesthesia Quick Evaluation

## 2020-08-14 NOTE — Anesthesia Procedure Notes (Signed)
Procedure Name: LMA Insertion Date/Time: 08/14/2020 10:15 AM Performed by: Jerrye Noble, CRNA Pre-anesthesia Checklist: Patient identified, Emergency Drugs available, Suction available and Patient being monitored Patient Re-evaluated:Patient Re-evaluated prior to induction Oxygen Delivery Method: Circle system utilized Preoxygenation: Pre-oxygenation with 100% oxygen Induction Type: IV induction Ventilation: Mask ventilation without difficulty LMA: LMA inserted LMA Size: 3.5 Number of attempts: 2 Placement Confirmation: positive ETCO2 and breath sounds checked- equal and bilateral Tube secured with: Tape Dental Injury: Teeth and Oropharynx as per pre-operative assessment

## 2020-08-14 NOTE — H&P (Signed)
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  The various methods of treatment have been discussed with the patient.  No guarantees were given.  After consideration of risks, benefits and other options for treatment, the patient has consented to surgery.  I have reviewed the patients' chart and labs.     A history and physical examination was performed in my office.  The patient was reexamined.  There have been no changes to this history and physical examination.  [name] A

## 2020-08-14 NOTE — Op Note (Signed)
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  Triple arthrodesis left foot 2.  Naviculocuneiform arthrodesis left foot    EBL: 75 mL    Anesthesia:local and general    Hemostasis: Thigh tourniquet inflated to 250 mmHg for 123-minute    Specimen: Navicular fracture nonunion    Complications: None    Operative indications:Tamara Villegas is an 74 y.o. that presents today for surgical intervention.  The risks/benefits/alternatives/complications have been discussed and consent has been given.    Procedure:  Patient was brought into the OR and placed on the operating table in thesupine position. After anesthesia was obtained theleft lower extremity was prepped and draped in usual sterile fashion.  Attention was initially directed to the lateral aspect of the foot where the lateral incision was performed from the distal fibula overlying the fourth and fifth met cuboid region.  Sharp and blunt dissection carried down to the capsule.  Subcapsular and subperiosteal dissection was then performed exposing the subtalar joint and calcaneocuboid joint.  At this time all of the articular cartilage was removed from the subtalar and calcaneocuboid joint.  There was noted to be a fracture fragment dorsally along the calcaneocuboid region.  Attention was then directed to the dorsal medial aspect of the foot just lateral to the anterior tibial tendon and incision was made from the ankle joint distally to the first met cuneiform region.  Sharp and blunt dissection carried down to the periosteum.  Subperiosteal dissection was then undertaken both medial and lateral.  At this time there was no to be a large fractured fragment of the navicular on the dorsal lateral aspect extending into the talonavicular joint region.  This loose fracture fragment was then excised  from the surgical field in toto.  A more medial piece of the navicular was noted at the medial one half of the talonavicular joint region.  This was then freed and placed more into its anatomically aligned position.  At this point all of the articular cartilage was removed from the talonavicular as well as the medial navicular cuneiform region.  There was noted to be a fair amount of bone loss to the talar head at this time as well.  All wounds were then flushed with copious amounts of irrigation.  The joints were then prepped with a 2.7 mm drill bit.  Next the subtalar joint was infiltrated with a Therapist, nutritional bone graft.  A large posterior to superior calcaneal screw was placed.  A 6.5 millimeter screw with good compression was noted across the subtalar joint.  Attention was redirected to the talonavicular joint.  The navicular was placed back into the talonavicular cuneiform region.  At this time it was decided to place a medial column plate.  A medial incision was made course on the medial column.  Sharp and blunt dissection carried down to the periosteum.  Subperiosteal dissection was then undertaken at this time I was able to place a medial column compression plate from [employer].  This spanned from the talus to the cuneiform.  Compression was placed across the area and 3.5 millimeter screws were used to fill the area.  Screws were placed into the talus navicular and cuneiform for good stability and alignment.  Attention was then redirected laterally to the calcaneocuboid joint.  At this time to 20 mm compression bone staples  were applied.  Good stability was noted in all planes.  All wounds were flushed with copious amounts of irrigation.  Closure was then performed with a 2-0 and 3-0 Vicryl for the deeper and subcutaneous tissue and a 3-0 nylon for skin.  All areas were then infiltrated with 0.25% bupivacaine and Exparel long-acting anesthetic.  She was placed in an equalizer walker boot with a compression  bandage.    Patient tolerated the procedure and anesthesia well.  Was transported from the OR to the PACU with all vital signs stable and vascular status intact. To be discharged per routine protocol.  Will follow up in approximately 1 week in the outpatient clinic.

## 2020-08-14 NOTE — Addendum Note (Signed)
Addendum  created 08/14/20 1601 by Gunnar Fusi, MD   Clinical Note Signed

## 2020-08-14 NOTE — Discharge Instructions (Signed)
[place] DR. TROXLER, DR. Vickki Muff, AND DR. Belknap   1. Take your medication as prescribed.  Pain medication should be taken only as needed.  2. Keep the dressing clean, dry and intact.  3. Keep your foot elevated above the heart level for the first 48 hours.  4. We have instructed you to be non-weight bearing.  5. Always wear your post-op shoe when walking.  Always use your crutches if you are to be non-weight bearing.  6. Do not take a shower. Baths are permissible as long as the foot is kept out of the water.   7. Every hour you are awake:  - Bend your knee 15 times.  8. Call [facility] [phone]) if any of the following problems occur: - You develop a temperature or fever. - The bandage becomes saturated with blood. - Medication does not stop your pain. - Injury of the foot occurs. - Any symptoms of infection including redness, odor, or red streaks running from wound.  AMBULATORY SURGERY  DISCHARGE INSTRUCTIONS   1) The drugs that you were given will stay in your system until tomorrow so for the next 24 hours you should not:  A) Drive an automobile B) Make any legal decisions C) Drink any alcoholic beverage   2) You may resume regular meals tomorrow.  Today it is better to start with liquids and gradually work up to solid foods.  You may eat anything you prefer, but it is better to start with liquids, then soup and crackers, and gradually work up to solid foods.   3) Please notify your doctor immediately if you have any unusual bleeding, trouble breathing, redness and pain at the surgery site, drainage, fever, or pain not relieved by medication.    4) Additional Instructions:        Please contact your physician with any problems or Same Day Surgery at [phone], Monday through Friday 6 am to 4 pm, or [place] at [facility]  number at [phone].  Bupivacaine Liposomal Suspension for Injection What is this medicine? BUPIVACAINE LIPOSOMAL (bue PIV a kane LIP oh som al) is an anesthetic. It causes loss of feeling in the skin or other tissues. It is used to prevent and to treat pain from some procedures. This medicine may be used for other purposes; ask your health care provider or pharmacist if you have questions. COMMON BRAND NAME(S): EXPAREL What should I tell my health care provider before I take this medicine? They need to know if you have any of these conditions:  G6PD deficiency  heart disease  kidney disease  liver disease  low blood pressure  lung or breathing disease, like asthma  an unusual or allergic reaction to bupivacaine, other medicines, foods, dyes, or preservatives  pregnant or trying to get pregnant  breast-feeding How should I use this medicine? This medicine is for injection into the affected area. It is given by a health care professional in a hospital or clinic setting. Talk to your pediatrician regarding the use of this medicine in children. Special care may be needed. Overdosage: If you think you have taken too much of this medicine contact a poison control center or emergency room at once. NOTE: This medicine is only for you. Do not share this medicine with others. What if I miss a dose? This does not apply. What may interact with this medicine? This medicine may interact with the  following medications:  acetaminophen  certain antibiotics like dapsone, nitrofurantoin, aminosalicylic acid, sulfonamides  certain medicines for seizures like phenobarbital, phenytoin, valproic acid  chloroquine  cyclophosphamide  flutamide  hydroxyurea  ifosfamide  metoclopramide  nitric oxide  nitroglycerin  nitroprusside  nitrous oxide  other local anesthetics like lidocaine, pramoxine, tetracaine  primaquine  quinine  rasburicase  sulfasalazine This list may  not describe all possible interactions. Give your health care provider a list of all the medicines, herbs, non-prescription drugs, or dietary supplements you use. Also tell them if you smoke, drink alcohol, or use illegal drugs. Some items may interact with your medicine. What should I watch for while using this medicine? Your condition will be monitored carefully while you are receiving this medicine. Be careful to avoid injury while the area is numb, and you are not aware of pain. What side effects may I notice from receiving this medicine? Side effects that you should report to your doctor or health care professional as soon as possible:  allergic reactions like skin rash, itching or hives, swelling of the face, lips, or tongue  seizures  signs and symptoms of a dangerous change in heartbeat or heart rhythm like chest pain; dizziness; fast, irregular heartbeat; palpitations; feeling faint or lightheaded; falls; breathing problems  signs and symptoms of methemoglobinemia such as pale, gray, or blue colored skin; headache; fast heartbeat; shortness of breath; feeling faint or lightheaded, falls; tiredness Side effects that usually do not require medical attention (report to your doctor or health care professional if they continue or are bothersome):  anxious  back pain  changes in taste  changes in vision  constipation  dizziness  fever  nausea, vomiting This list may not describe all possible side effects. Call your doctor for medical advice about side effects. You may report side effects to FDA at 1-800-FDA-1088. Where should I keep my medicine? This drug is given in a hospital or clinic and will not be stored at home. NOTE: This sheet is a summary. It may not cover all possible information. If you have questions about this medicine, talk to your doctor, pharmacist, or health care provider.  2020 Elsevier/Gold Standard (2019-06-11 10:48:23)

## 2020-08-17 ENCOUNTER — Encounter: Payer: Self-pay | Admitting: Podiatry

## 2020-08-17 LAB — GLUCOSE, CAPILLARY: Glucose-Capillary: 138 mg/dL — ABNORMAL HIGH (ref 70–99)

## 2020-08-18 LAB — SURGICAL PATHOLOGY

## 2020-08-25 ENCOUNTER — Encounter: Payer: Self-pay | Admitting: Podiatry

## 2020-11-12 ENCOUNTER — Encounter: Payer: Self-pay | Admitting: Podiatry

## 2020-12-08 DIAGNOSIS — I350 Nonrheumatic aortic (valve) stenosis: Secondary | ICD-10-CM

## 2020-12-08 HISTORY — DX: Nonrheumatic aortic (valve) stenosis: I35.0

## 2020-12-17 ENCOUNTER — Other Ambulatory Visit: Payer: Self-pay | Admitting: Podiatry

## 2020-12-17 ENCOUNTER — Other Ambulatory Visit (HOSPITAL_COMMUNITY): Payer: Self-pay | Admitting: Podiatry

## 2020-12-17 DIAGNOSIS — M1460 Charcot's joint, unspecified site: Secondary | ICD-10-CM

## 2020-12-21 ENCOUNTER — Ambulatory Visit
Admission: RE | Admit: 2020-12-21 | Discharge: 2020-12-21 | Disposition: A | Discharge status: Home / Self Care | Payer: BC Managed Care – PPO | Source: Ambulatory Visit | Attending: Podiatry | Admitting: Podiatry

## 2020-12-21 ENCOUNTER — Other Ambulatory Visit: Payer: Self-pay

## 2020-12-21 DIAGNOSIS — M1460 Charcot's joint, unspecified site: Secondary | ICD-10-CM | POA: Insufficient documentation

## 2021-01-05 ENCOUNTER — Other Ambulatory Visit: Payer: Self-pay | Admitting: Podiatry

## 2021-01-13 ENCOUNTER — Inpatient Hospital Stay
Admission: RE | Admit: 2021-01-13 | Discharge: 2021-01-13 | Disposition: A | Discharge status: Home / Self Care | Payer: BC Managed Care – PPO | Source: Ambulatory Visit

## 2021-01-13 NOTE — Patient Instructions (Signed)
Your procedure is scheduled on: 01/22/21 - Friday Report to the Registration Desk on the 1st floor of the [facility]. To find out your arrival time, please call [phone] between 1PM - 3PM on: 01/21/21 - Thursday [facility] for Labs   REMEMBER: Instructions that are not followed completely may result in serious medical risk, up to and including death; or upon the discretion of your surgeon and anesthesiologist your surgery may need to be rescheduled.  Do not eat food after midnight the night before surgery.  No gum chewing, lozengers or hard candies.  You may however, drink CLEAR liquids up to 2 hours before you are scheduled to arrive for your surgery. Do not drink anything within 2 hours of your scheduled arrival time.  Type 1 and Type 2 diabetics should only drink water.  TAKE THESE MEDICATIONS THE MORNING OF SURGERY WITH A SIP OF WATER:  - DULoxetine (CYMBALTA) 30 MG capsule - ezetimibe (ZETIA) 10 MG tablet - timolol (BETIMOL) 0.5 % ophthalmic solution  Follow recommendations from Cardiologist, Pulmonologist or PCP regarding stopping Aspirin, Coumadin, Plavix, Eliquis, Pradaxa, or Pletal.  One week prior to surgery: Stop Anti-inflammatories (NSAIDS) such as Advil, Aleve, Ibuprofen, Motrin, Naproxen, Naprosyn and Aspirin based products such as Excedrin, Goodys Powder, BC Powder.  Stop ANY OVER THE COUNTER supplements until after surgery.  No Alcohol for 24 hours before or after surgery.  No Smoking including e-cigarettes for 24 hours prior to surgery.  No chewable tobacco products for at least 6 hours prior to surgery.  No nicotine patches on the day of surgery.  Do not use any "recreational" drugs for at least a week prior to your surgery.  Please be advised that the combination of cocaine and anesthesia may have negative outcomes, up to and including death. If you test positive for cocaine, your surgery will be cancelled.  On the morning of surgery brush  your teeth with toothpaste and water, you may rinse your mouth with mouthwash if you wish. Do not swallow any toothpaste or mouthwash.  Do not wear jewelry, make-up, hairpins, clips or nail polish.  Do not wear lotions, powders, or perfumes.   Do not shave body from the neck down 48 hours prior to surgery just in case you cut yourself which could leave a site for infection.  Also, freshly shaved skin may become irritated if using the CHG soap.  Contact lenses, hearing aids and dentures may not be worn into surgery.  Do not bring valuables to the hospital. [facility] is not responsible for any missing/lost belongings or valuables.   Use CHG Soap or wipes as directed on instruction sheet.  Bring your C-PAP to the hospital with you in case you may have to spend the night.   Notify your doctor if there is any change in your medical condition (cold, fever, infection).  Wear comfortable clothing (specific to your surgery type) to the hospital.  Plan for stool softeners for home use; pain medications have a tendency to cause constipation. You can also help prevent constipation by eating foods high in fiber such as fruits and vegetables and drinking plenty of fluids as your diet allows.  After surgery, you can help prevent lung complications by doing breathing exercises.  Take deep breaths and cough every 1-2 hours. Your doctor may order a device called an Incentive Spirometer to help you take deep breaths. When coughing or sneezing, hold a pillow firmly against your incision with both hands. This is called "  splinting." Doing this helps protect your incision. It also decreases belly discomfort.  If you are being admitted to the hospital overnight, leave your suitcase in the car. After surgery it may be brought to your room.  If you are being discharged the day of surgery, you will not be allowed to drive home. You will need a responsible adult (18 years or older) to drive you home and stay  with you that night.   If you are taking public transportation, you will need to have a responsible adult (18 years or older) with you. Please confirm with your physician that it is acceptable to use public transportation.   Please call the [facility] at [phone] if you have any questions about these instructions.  Surgery Visitation Policy:  Patients undergoing a surgery or procedure may have one family member or support person with them as long as that person is not COVID-19 positive or experiencing its symptoms.  That person may remain in the waiting area during the procedure.  Inpatient Visitation:    Visiting hours are 7 a.m. to 8 p.m. Inpatients will be allowed two visitors daily. The visitors may change each day during the patient's stay. No visitors under the age of 32. Any visitor under the age of 19 must be accompanied by an adult. The visitor must pass COVID-19 screenings, use hand sanitizer when entering and exiting the patient's room and wear a mask at all times, including in the patient's room. Patients must also wear a mask when staff or their visitor are in the room. Masking is required regardless of vaccination status.

## 2021-01-14 ENCOUNTER — Other Ambulatory Visit: Payer: Self-pay

## 2021-01-14 ENCOUNTER — Other Ambulatory Visit
Admission: RE | Admit: 2021-01-14 | Discharge: 2021-01-14 | Disposition: A | Discharge status: Home / Self Care | Payer: BC Managed Care – PPO | Source: Ambulatory Visit | Attending: Urology | Admitting: Urology

## 2021-01-14 NOTE — Patient Instructions (Addendum)
Your procedure is scheduled on: 01/22/21 - Friday Report to the Registration Desk on the 1st floor of the [facility]. To find out your arrival time, please call [phone] between 1PM - 3PM on: 01/21/21 - Thursday Report To [facility] for Labs and Bag 01/20/21 at 8:00 am  REMEMBER: Instructions that are not followed completely may result in serious medical risk, up to and including death; or upon the discretion of your surgeon and anesthesiologist your surgery may need to be rescheduled.  Do not eat food after midnight the night before surgery.  No gum chewing, lozengers or hard candies.  You may however, drink CLEAR liquids up to 2 hours before you are scheduled to arrive for your surgery. Do not drink anything within 2 hours of your scheduled arrival time.  Type 1 and Type 2 diabetics should only drink water.  TAKE THESE MEDICATIONS THE MORNING OF SURGERY WITH A SIP OF WATER:  - DULoxetine (CYMBALTA) 30 MG capsule - ezetimibe (ZETIA) 10 MG tablet - timolol (BETIMOL) 0.5 % ophthalmic solution  Follow recommendations from Cardiologist, Pulmonologist or PCP regarding stopping Aspirin, Coumadin, Plavix, Eliquis, Pradaxa, or Pletal.  One week prior to surgery: Stop Anti-inflammatories (NSAIDS) such as Advil, Aleve, Ibuprofen, Motrin, Naproxen, Naprosyn and Aspirin based products such as Excedrin, Goodys Powder, BC Powder.  Stop ANY OVER THE COUNTER supplements until after surgery.  No Alcohol for 24 hours before or after surgery.  No Smoking including e-cigarettes for 24 hours prior to surgery.  No chewable tobacco products for at least 6 hours prior to surgery.  No nicotine patches on the day of surgery.  Do not use any "recreational" drugs for at least a week prior to your surgery.  Please be advised that the combination of cocaine and anesthesia may have negative outcomes, up to and including death. If you test positive for cocaine, your surgery will be cancelled.  On  the morning of surgery brush your teeth with toothpaste and water, you may rinse your mouth with mouthwash if you wish. Do not swallow any toothpaste or mouthwash.  Do not wear jewelry, make-up, hairpins, clips or nail polish.  Do not wear lotions, powders, or perfumes.   Do not shave body from the neck down 48 hours prior to surgery just in case you cut yourself which could leave a site for infection.  Also, freshly shaved skin may become irritated if using the CHG soap.  Contact lenses, hearing aids and dentures may not be worn into surgery.  Do not bring valuables to the hospital. [facility] is not responsible for any missing/lost belongings or valuables.   Use CHG Soap or wipes as directed on instruction sheet.   Notify your doctor if there is any change in your medical condition (cold, fever, infection).  Wear comfortable clothing (specific to your surgery type) to the hospital.  Plan for stool softeners for home use; pain medications have a tendency to cause constipation. You can also help prevent constipation by eating foods high in fiber such as fruits and vegetables and drinking plenty of fluids as your diet allows.  After surgery, you can help prevent lung complications by doing breathing exercises.  Take deep breaths and cough every 1-2 hours. Your doctor may order a device called an Incentive Spirometer to help you take deep breaths. When coughing or sneezing, hold a pillow firmly against your incision with both hands. This is called "splinting." Doing this helps protect your incision. It also decreases belly discomfort.  If you are being admitted to the hospital overnight, leave your suitcase in the car. After surgery it may be brought to your room.  If you are being discharged the day of surgery, you will not be allowed to drive home. You will need a responsible adult (18 years or older) to drive you home and stay with you that night.   If you are taking public  transportation, you will need to have a responsible adult (18 years or older) with you. Please confirm with your physician that it is acceptable to use public transportation.   Please call the [facility] at [phone] if you have any questions about these instructions.  Surgery Visitation Policy:  Patients undergoing a surgery or procedure may have one family member or support person with them as long as that person is not COVID-19 positive or experiencing its symptoms.  That person may remain in the waiting area during the procedure.  Inpatient Visitation:    Visiting hours are 7 a.m. to 8 p.m. Inpatients will be allowed two visitors daily. The visitors may change each day during the patient's stay. No visitors under the age of 59. Any visitor under the age of 11 must be accompanied by an adult. The visitor must pass COVID-19 screenings, use hand sanitizer when entering and exiting the patient's room and wear a mask at all times, including in the patient's room. Patients must also wear a mask when staff or their visitor are in the room. Masking is required regardless of vaccination status.

## 2021-01-18 ENCOUNTER — Encounter: Payer: Self-pay | Admitting: Podiatry

## 2021-01-18 NOTE — Progress Notes (Signed)
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 Clinical nursing staff has updated patient's PMH/PSHx, current medication list, and drug allergies/intolerances to ensure comprehensive history available to assist in medical decision making as it pertains to the aforementioned surgical procedure and anticipated anesthetic course.   Clinical Discussion:  Tamara Villegas is a 75 y.o. female who is submitted for pre-surgical anesthesia review and clearance prior to her undergoing the above procedure. Patient is a Former Research scientist (life sciences). Pertinent PMH includes: CAD, nonrheumatic aortic valve insufficiency, mild aortic valve stenosis, carotid atherosclerosis, HTN, HLD, T2DM, CKD-III, DOE, OA, diabetic neuropathy, HBV.  Patient is followed by cardiology Saralyn Pilar, MD). She was last seen in the cardiology clinic on 12/15/2020; notes reviewed.  At the time of her clinic visit, patient denied any complaints of chest pain.  Patient with continued mild exertional dyspnea that she attributed to weight gain following ankle surgery.  No PND, orthopnea, palpitations, peripheral edema, vertiginous symptoms, or presyncope/syncope.  PMH significant for CAD.  Myocardial perfusion imaging study demonstrated  evidence of a small mild intensity anteroapical perfusion abnormality suggestive of ischemia.  Diagnostic left heart catheterization performed on 06/12/2019 revealed single-vessel CAD with 80% stenosis of the mid LAD.  Lesion was treated with a DES x 1 resulting in 0% residual stenosis. Of note, there was 2+ aortic regurgitation also noted.  Last TTE performed on 12/08/2020 revealed normal left ventricular systolic function (LVEF [id number]), moderate left atrial enlargement, moderate aortic valve regurgitation, and mild aortic valve stenosis; mean gradient 9.9 mmHg (see full interpretation of cardiovascular testing and intervention below). Patient on GDMT for her HTN and HLD diagnoses.  Blood pressure relatively well controlled at 142/68 on currently prescribed ACEi monotherapy. Patient is on a statin and ezetimibe for her HLD. T2DM well controlled on currently prescribed regimen; Hgb A1c 7.0% when last checked on 07/17/2020. Functional capacity, as defined by DASI, is documented as being >/= 4 METS.  No changes were made to patient's medication regimen.  Patient to follow-up with outpatient cardiology in 6 months or sooner if needed.  Patient is scheduled for a podiatric procedure on 01/22/2021 with Dr. [name], DPM.  Given patient's past medical history significant for cardiovascular diagnoses, presurgical cardiac clearance was sought by the PAT team. Per cardiology, "this patient is optimized for surgery and may proceed with the planned procedural course with a LOW risk stratification". This patient ison daily antiplatelet therapy. She has been instructed on recommendations for holding her daily low dose ASA for 5 days prior to her procedure with plans to restart as soon as postoperative bleeding risk felt to be minimized by her attending surgeon. The patient has been instructed that her last dose of  her anticoagulant will be on 01/16/2021.  Patient denies previous perioperative complications with anesthesia  in the past. In review of the available records, it is noted that patient underwent a general anesthetic course here (ASA III) in 08/2020 without documented complications.   Vitals with BMI 01/14/2021 08/14/2020 08/14/2020  Height 5' 5.5" - -  Weight 198 lbs - -  BMI 23.53 - -  Systolic - 614 431  Diastolic - 58 56  Pulse - 92 84    Providers/Specialists:   NOTE: Primary physician provider listed below. Patient may have been seen by APP or partner within same practice.   PROVIDER ROLE / SPECIALTY LAST Montel Culver, DPM  Podiatry (Surgeon)  01/04/2021  Ezequiel Kayser, MD  Primary Care Provider  07/17/2020  Isaias Cowman, MD  Cardiology  12/15/2020   Allergies:  Sudafed [pseudoephedrine]  Current Home Medications:   No current facility-administered medications for this encounter.   Marland Kitchen acetaminophen (TYLENOL) 500 MG tablet  . aspirin 81 MG tablet  . atorvastatin (LIPITOR) 80 MG tablet  . Calcium Carb-Cholecalciferol (CALCIUM 600 + D PO)  . Cholecalciferol (VITAMIN D) 50 MCG (2000 UT) CAPS  . DULoxetine (CYMBALTA) 30 MG capsule  . ezetimibe (ZETIA) 10 MG tablet  . latanoprost (XALATAN) 0.005 % ophthalmic solution  . lisinopril (ZESTRIL) 20 MG tablet  . Multiple Vitamin (MULTIVITAMIN) tablet  . Omega-3 Fatty Acids (FISH OIL) 1200 MG CAPS  . timolol (BETIMOL) 0.5 % ophthalmic solution  . oxyCODONE-acetaminophen (PERCOCET) 5-325 MG tablet   History:   Past Medical History:  Diagnosis Date  . Adenomatous polyp of colon 07/04/2014  . Aortic stenosis, mild 12/08/2020   Mean gradient 9.9 mmHg  . Carotid atherosclerosis, bilateral   . CKD (chronic kidney disease), stage III ([place])   . Coronary artery disease   . DOE (dyspnea on exertion)   . Glaucoma   . Hepatitis B 1993  . Herpes zoster infection of thoracic region 12/14/2016   left  . History of coronary angioplasty with insertion of stent 06/12/2019   DES x 1 to mLAD  . History of kidney stones   .  Hypercholesteremia   . Hypertension   . Neuropathy    feet  . Nonrheumatic aortic valve insufficiency   . Osteoarthritis of left foot   . T2DM (type 2 diabetes mellitus) ([place])    diet control  . Vitamin D deficiency   . Wears dentures    full upper, partial lower   Past Surgical History:  Procedure Laterality Date  . ABDOMINAL HYSTERECTOMY  1975  . BREAST CYST ASPIRATION Left 1999   negative  . CARDIAC CATHETERIZATION    . CATARACT EXTRACTION W/PHACO Right 10/19/2016   Procedure: CATARACT EXTRACTION PHACO AND INTRAOCULAR LENS PLACEMENT ([place]) Right;  Surgeon: Leandrew Koyanagi, MD;  Location: [place];  Service: Ophthalmology;  Laterality: Right;  . CATARACT EXTRACTION W/PHACO Left 11/02/2016   Procedure: CATARACT EXTRACTION PHACO AND INTRAOCULAR LENS PLACEMENT ([place])  left;  Surgeon: Leandrew Koyanagi, MD;  Location: [place];  Service: Ophthalmology;  Laterality: Left;  . COLONOSCOPY  2007, 2017  . CORONARY ANGIOPLASTY    . CORONARY STENT INTERVENTION N/A 06/12/2019   Procedure: CORONARY STENT INTERVENTION;  Surgeon: Isaias Cowman, MD;  Location: [facility] [facility];  Service: Cardiovascular; DES x 1 to mLAD  . dental work    . EYE SURGERY    . FOOT ARTHRODESIS Left 08/14/2020   Procedure: ARTHRODESIS FOOT  TRIPLE LEFT;  Surgeon: Vickki Muff,  Larkin Ina, DPM;  Location: [facility];  Service: Podiatry;  Laterality: Left;  . FUSION OF TALONAVICULAR JOINT Left 08/14/2020   Procedure: FUSION OF MIDFOOT LEFT;  Surgeon: [name], DPM;  Location: [facility];  Service: Podiatry;  Laterality: Left;  . LEFT HEART CATH AND CORONARY ANGIOGRAPHY Left 06/12/2019   Procedure: LEFT HEART CATH AND CORONARY ANGIOGRAPHY;  Surgeon: Isaias Cowman, MD;  Location: [facility];  Service: Cardiovascular;  Laterality: Left;  . TONSILLECTOMY     Family History  Problem Relation Age of Onset  . Breast cancer Daughter 74  . Breast cancer Maternal Aunt   . Kidney disease  Mother   . Heart attack Father    Social History   Tobacco Use  . Smoking status: Former Smoker    Types: Cigarettes  . Smokeless tobacco: Never Used  . Tobacco comment: early 20's  Vaping Use  . Vaping Use: Never used  Substance Use Topics  . Alcohol use: Yes    Comment: rarely  . Drug use: Never    Pertinent Clinical Results:  LABS: Labs reviewed: Acceptable for surgery.  Hospital Outpatient Visit on 01/20/2021  Component Date Value Ref Range Status  . WBC 01/20/2021 6.3  4.0 - 10.5 K/uL Final  . RBC 01/20/2021 4.22  3.87 - 5.11 MIL/uL Final  . Hemoglobin 01/20/2021 12.3  12.0 - 15.0 g/dL Final  . HCT 01/20/2021 37.9  36.0 - 46.0 % Final  . MCV 01/20/2021 89.8  80.0 - 100.0 fL Final  . MCH 01/20/2021 29.1  26.0 - 34.0 pg Final  . MCHC 01/20/2021 32.5  30.0 - 36.0 g/dL Final  . RDW 01/20/2021 13.4  11.5 - 15.5 % Final  . Platelets 01/20/2021 258  150 - 400 K/uL Final  . nRBC 01/20/2021 0.0  0.0 - 0.2 % Final   Performed at [facility], [address] [place], [place] [address]  . Sodium 01/20/2021 140  135 - 145 mmol/L Final  . Potassium 01/20/2021 3.6  3.5 - 5.1 mmol/L Final  . Chloride 01/20/2021 106  98 - 111 mmol/L Final  . CO2 01/20/2021 25  22 - 32 mmol/L Final  . Glucose, Bld 01/20/2021 197* 70 - 99 mg/dL Final   Glucose reference range applies only to samples taken after fasting for at least 8 hours.  . BUN 01/20/2021 11  8 - 23 mg/dL Final  . Creatinine, Ser 01/20/2021 0.80  0.44 - 1.00 mg/dL Final  . Calcium 01/20/2021 8.9  8.9 - 10.3 mg/dL Final  . GFR, Estimated 01/20/2021 >60  >60 mL/min Final   Comment: (NOTE) Calculated using the CKD-EPI Creatinine Equation (2021)   . Anion gap 01/20/2021 9  5 - 15 Final   Performed at [facility], [place], [place] [address]    ECG: Date: 01/20/2021 Time ECG obtained: 0813 AM Rate: 69 bpm Rhythm: normal sinus Axis (leads I and aVF): Normal Intervals: PR 132 ms. QRS 80 ms.  QTc 430 ms. ST segment and T wave changes: No evidence of acute ST segment elevation or depression Comparison: Similar to previous tracing obtained on 08/05/2020   IMAGING / PROCEDURES: ECHOCARDIOGRAM performed on 12/08/2020 1. LVEF >55% 2. Normal left ventricular systolic function 3. Normal right ventricular systolic function 4. Moderate left atrial enlargement 5. Moderate aortic valve regurgitation 6. Mild mitral and tricuspid valve regurgitation 7. No pulmonic valve regurgitation 8. Mild aortic valve stenosis = AVA (VTI) equals 0.94 cm; mean gradient 9.9 mmHg  LEFT HEART CATHETERIZATION AND CORONARY ANGIOGRAPHY performed on 06/12/2019 1. Single-vessel CAD with 80% stenosis of the mid LAD  DES x 1 (Resolute Onyx 2.5 x 12 mm) stent placed to the mid LAD resulting in a 0% residual stenosis 2. Normal left ventricular function 3. Mild to moderate aortic insufficiency  LEXISCAN performed on 05/28/2019 1. LVEF 67% 2. Regional wall motion reveals normal myocardial thickening and wall motion 3. No artifacts noted 4. Left ventricular cavity size normal 5. Moderate small reversible perfusion abnormality of mild intensity present in the anteroapical region on stress imaging 6. The overall quality of the study is good   Impression and Plan:  Tamara Villegas has been referred for pre-anesthesia review and clearance prior to her undergoing the planned anesthetic and procedural courses. Available labs, pertinent testing, and imaging results were personally reviewed by me. This patient has been appropriately cleared by cardiology with an overall LOW risk of significant perioperative cardiovascular complications.  Based on clinical review performed today (01/20/21), barring any significant acute changes in the patient's overall condition, it is anticipated that she will be able to proceed with the planned surgical intervention. Any acute changes in clinical condition may necessitate her procedure  being postponed and/or cancelled. Patient will meet with anesthesia team (MD and/or CRNA) on the day of her procedure for preoperative evaluation/assessment. Questions regarding anesthetic course will be fielded at that time.   Pre-surgical instructions were reviewed with the patient during her PAT appointment and questions were fielded by PAT clinical staff. Patient was advised that if any questions or concerns arise prior to her procedure then she should return a call to PAT and/or her surgeon's office to discuss.  Honor Loh, MSN, APRN, FNP-C, CEN [facility]  Peri-operative Services Nurse Practitioner Phone: [phone] 01/20/21 12:18 PM  NOTE: This note has been prepared using Dragon dictation software. Despite my best ability to proofread, there is always the potential that unintentional transcriptional errors may still occur from this process.

## 2021-01-20 ENCOUNTER — Other Ambulatory Visit: Payer: Self-pay

## 2021-01-20 ENCOUNTER — Other Ambulatory Visit
Admission: RE | Admit: 2021-01-20 | Discharge: 2021-01-20 | Disposition: A | Discharge status: Home / Self Care | Payer: BC Managed Care – PPO | Source: Ambulatory Visit | Attending: Podiatry | Admitting: Podiatry

## 2021-01-20 DIAGNOSIS — Z955 Presence of coronary angioplasty implant and graft: Secondary | ICD-10-CM | POA: Diagnosis not present

## 2021-01-20 DIAGNOSIS — Z01818 Encounter for other preprocedural examination: Secondary | ICD-10-CM | POA: Insufficient documentation

## 2021-01-20 DIAGNOSIS — E1140 Type 2 diabetes mellitus with diabetic neuropathy, unspecified: Secondary | ICD-10-CM | POA: Diagnosis not present

## 2021-01-20 DIAGNOSIS — Z01810 Encounter for preprocedural cardiovascular examination: Secondary | ICD-10-CM | POA: Diagnosis not present

## 2021-01-20 DIAGNOSIS — M2011 Hallux valgus (acquired), right foot: Secondary | ICD-10-CM | POA: Diagnosis not present

## 2021-01-20 DIAGNOSIS — Z87891 Personal history of nicotine dependence: Secondary | ICD-10-CM | POA: Diagnosis not present

## 2021-01-20 DIAGNOSIS — Z9842 Cataract extraction status, left eye: Secondary | ICD-10-CM | POA: Diagnosis not present

## 2021-01-20 DIAGNOSIS — Z961 Presence of intraocular lens: Secondary | ICD-10-CM | POA: Diagnosis not present

## 2021-01-20 DIAGNOSIS — Z888 Allergy status to other drugs, medicaments and biological substances status: Secondary | ICD-10-CM | POA: Diagnosis not present

## 2021-01-20 DIAGNOSIS — Z79899 Other long term (current) drug therapy: Secondary | ICD-10-CM | POA: Diagnosis not present

## 2021-01-20 DIAGNOSIS — R54 Age-related physical debility: Secondary | ICD-10-CM | POA: Insufficient documentation

## 2021-01-20 DIAGNOSIS — Z9841 Cataract extraction status, right eye: Secondary | ICD-10-CM | POA: Diagnosis not present

## 2021-01-20 DIAGNOSIS — M19071 Primary osteoarthritis, right ankle and foot: Secondary | ICD-10-CM | POA: Diagnosis present

## 2021-01-20 DIAGNOSIS — Z7982 Long term (current) use of aspirin: Secondary | ICD-10-CM | POA: Diagnosis not present

## 2021-01-20 LAB — CBC
HCT: 37.9 % (ref 36.0–46.0)
Hemoglobin: 12.3 g/dL (ref 12.0–15.0)
MCH: 29.1 pg (ref 26.0–34.0)
MCHC: 32.5 g/dL (ref 30.0–36.0)
MCV: 89.8 fL (ref 80.0–100.0)
Platelets: 258 10*3/uL (ref 150–400)
RBC: 4.22 MIL/uL (ref 3.87–5.11)
RDW: 13.4 % (ref 11.5–15.5)
WBC: 6.3 10*3/uL (ref 4.0–10.5)
nRBC: 0 % (ref 0.0–0.2)

## 2021-01-20 LAB — BASIC METABOLIC PANEL
Anion gap: 9 (ref 5–15)
BUN: 11 mg/dL (ref 8–23)
CO2: 25 mmol/L (ref 22–32)
Calcium: 8.9 mg/dL (ref 8.9–10.3)
Chloride: 106 mmol/L (ref 98–111)
Creatinine, Ser: 0.8 mg/dL (ref 0.44–1.00)
GFR, Estimated: >60 mL/min (ref >60)
Glucose, Bld: 197 mg/dL — ABNORMAL HIGH (ref 70–99)
Potassium: 3.6 mmol/L (ref 3.5–5.1)
Sodium: 140 mmol/L (ref 135–145)

## 2021-01-21 MED ORDER — POVIDONE-IODINE 7.5 % EX SOLN
Freq: Once | CUTANEOUS | Status: DC
  Filled 2021-01-21: qty 118

## 2021-01-21 MED ORDER — FAMOTIDINE 20 MG PO TABS: 20.0000 mg | ORAL_TABLET | Freq: Once | ORAL | Status: AC

## 2021-01-21 MED ORDER — CEFAZOLIN SODIUM-DEXTROSE 2-4 GM/100ML-% IV SOLN
2.0000 g | INTRAVENOUS | Status: AC
  Administered 2021-01-22: 2 g via INTRAVENOUS

## 2021-01-21 MED ORDER — CHLORHEXIDINE GLUCONATE 0.12 % MT SOLN: 15.0000 mL | Freq: Once | OROMUCOSAL | Status: AC

## 2021-01-21 MED ORDER — ORAL CARE MOUTH RINSE: 15.0000 mL | Freq: Once | OROMUCOSAL | Status: AC

## 2021-01-21 MED ORDER — SODIUM CHLORIDE 0.9 % IV SOLN: INTRAVENOUS | Status: DC

## 2021-01-22 ENCOUNTER — Ambulatory Visit: Payer: BC Managed Care – PPO | Admitting: Urgent Care

## 2021-01-22 ENCOUNTER — Ambulatory Visit: Payer: BC Managed Care – PPO

## 2021-01-22 ENCOUNTER — Ambulatory Visit
Admission: RE | Admit: 2021-01-22 | Discharge: 2021-01-22 | Disposition: A | Discharge status: Home / Self Care | Payer: BC Managed Care – PPO | Attending: Podiatry | Admitting: Podiatry

## 2021-01-22 ENCOUNTER — Other Ambulatory Visit: Payer: Self-pay

## 2021-01-22 ENCOUNTER — Encounter
Admission: RE | Disposition: A | Discharge status: Home / Self Care | Payer: Self-pay | Source: Home / Self Care | Attending: Podiatry

## 2021-01-22 ENCOUNTER — Encounter: Payer: Self-pay | Admitting: Podiatry

## 2021-01-22 DIAGNOSIS — M19071 Primary osteoarthritis, right ankle and foot: Secondary | ICD-10-CM | POA: Diagnosis not present

## 2021-01-22 DIAGNOSIS — Z79899 Other long term (current) drug therapy: Secondary | ICD-10-CM | POA: Insufficient documentation

## 2021-01-22 DIAGNOSIS — Z419 Encounter for procedure for purposes other than remedying health state, unspecified: Secondary | ICD-10-CM

## 2021-01-22 DIAGNOSIS — Z961 Presence of intraocular lens: Secondary | ICD-10-CM | POA: Insufficient documentation

## 2021-01-22 DIAGNOSIS — E1140 Type 2 diabetes mellitus with diabetic neuropathy, unspecified: Secondary | ICD-10-CM | POA: Insufficient documentation

## 2021-01-22 DIAGNOSIS — Z888 Allergy status to other drugs, medicaments and biological substances status: Secondary | ICD-10-CM | POA: Insufficient documentation

## 2021-01-22 DIAGNOSIS — Z7982 Long term (current) use of aspirin: Secondary | ICD-10-CM | POA: Insufficient documentation

## 2021-01-22 DIAGNOSIS — Z87891 Personal history of nicotine dependence: Secondary | ICD-10-CM | POA: Insufficient documentation

## 2021-01-22 DIAGNOSIS — M2011 Hallux valgus (acquired), right foot: Secondary | ICD-10-CM | POA: Insufficient documentation

## 2021-01-22 DIAGNOSIS — Z9841 Cataract extraction status, right eye: Secondary | ICD-10-CM | POA: Insufficient documentation

## 2021-01-22 DIAGNOSIS — Z9842 Cataract extraction status, left eye: Secondary | ICD-10-CM | POA: Insufficient documentation

## 2021-01-22 DIAGNOSIS — Z955 Presence of coronary angioplasty implant and graft: Secondary | ICD-10-CM | POA: Insufficient documentation

## 2021-01-22 HISTORY — PX: FOOT ARTHRODESIS: SHX1655

## 2021-01-22 HISTORY — DX: Other forms of dyspnea: R06.09

## 2021-01-22 HISTORY — DX: Dyspnea, unspecified: R06.00

## 2021-01-22 HISTORY — DX: Chronic kidney disease, stage 3 unspecified: N18.30

## 2021-01-22 HISTORY — PX: WEIL OSTEOTOMY: SHX5044

## 2021-01-22 HISTORY — PX: BUNIONECTOMY: SHX129

## 2021-01-22 HISTORY — DX: Type 2 diabetes mellitus without complications: E11.9

## 2021-01-22 LAB — POCT I-STAT, CHEM 8
BUN: 13 mg/dL (ref 8–23)
Calcium, Ion: 1.21 mmol/L (ref 1.15–1.40)
Chloride: 105 mmol/L (ref 98–111)
Creatinine, Ser: 0.8 mg/dL (ref 0.44–1.00)
Glucose, Bld: 151 mg/dL — ABNORMAL HIGH (ref 70–99)
HCT: 36 % (ref 36.0–46.0)
Hemoglobin: 12.2 g/dL (ref 12.0–15.0)
Potassium: 3.7 mmol/L (ref 3.5–5.1)
Sodium: 140 mmol/L (ref 135–145)
TCO2: 23 mmol/L (ref 22–32)

## 2021-01-22 LAB — GLUCOSE, CAPILLARY: Glucose-Capillary: 164 mg/dL — ABNORMAL HIGH (ref 70–99)

## 2021-01-22 SURGERY — FUSION, JOINT, FOOT
Anesthesia: General | Site: Toe | Laterality: Right

## 2021-01-22 MED ORDER — ONDANSETRON HCL 4 MG PO TABS: 4.0000 mg | ORAL_TABLET | Freq: Four times a day (QID) | ORAL | Status: DC | PRN

## 2021-01-22 MED ORDER — BUPIVACAINE LIPOSOME 1.3 % IJ SUSP
INTRAMUSCULAR | Status: AC
  Filled 2021-01-22: qty 20

## 2021-01-22 MED ORDER — CHLORHEXIDINE GLUCONATE 0.12 % MT SOLN
OROMUCOSAL | Status: AC
  Administered 2021-01-22: 15 mL via OROMUCOSAL
  Filled 2021-01-22: qty 15

## 2021-01-22 MED ORDER — MIDAZOLAM HCL 2 MG/2ML IJ SOLN
INTRAMUSCULAR | Status: DC | PRN
  Administered 2021-01-22: 2 mg via INTRAVENOUS

## 2021-01-22 MED ORDER — BUPIVACAINE HCL (PF) 0.25 % IJ SOLN
INTRAMUSCULAR | Status: AC
  Filled 2021-01-22: qty 30

## 2021-01-22 MED ORDER — FENTANYL CITRATE (PF) 100 MCG/2ML IJ SOLN: 25.0000 ug | INTRAMUSCULAR | Status: DC | PRN

## 2021-01-22 MED ORDER — LIDOCAINE HCL (PF) 1 % IJ SOLN
INTRAMUSCULAR | Status: AC
  Filled 2021-01-22: qty 30

## 2021-01-22 MED ORDER — OXYCODONE-ACETAMINOPHEN 5-325 MG PO TABS: 1.0000 | ORAL_TABLET | Freq: Four times a day (QID) | ORAL | 0 refills | Status: DC | PRN

## 2021-01-22 MED ORDER — MIDAZOLAM HCL 2 MG/2ML IJ SOLN
INTRAMUSCULAR | Status: AC
  Filled 2021-01-22: qty 2

## 2021-01-22 MED ORDER — METOCLOPRAMIDE HCL 5 MG/ML IJ SOLN: 5.0000 mg | Freq: Three times a day (TID) | INTRAMUSCULAR | Status: DC | PRN

## 2021-01-22 MED ORDER — ONDANSETRON HCL 4 MG/2ML IJ SOLN
INTRAMUSCULAR | Status: DC | PRN
  Administered 2021-01-22: 4 mg via INTRAVENOUS

## 2021-01-22 MED ORDER — PROPOFOL 10 MG/ML IV BOLUS
INTRAVENOUS | Status: AC
  Filled 2021-01-22: qty 20

## 2021-01-22 MED ORDER — LIDOCAINE HCL (CARDIAC) PF 100 MG/5ML IV SOSY
PREFILLED_SYRINGE | INTRAVENOUS | Status: DC | PRN
  Administered 2021-01-22: 100 mg via INTRATRACHEAL

## 2021-01-22 MED ORDER — CEFAZOLIN SODIUM-DEXTROSE 2-4 GM/100ML-% IV SOLN
INTRAVENOUS | Status: AC
  Filled 2021-01-22: qty 100

## 2021-01-22 MED ORDER — METOCLOPRAMIDE HCL 10 MG PO TABS: 5.0000 mg | ORAL_TABLET | Freq: Three times a day (TID) | ORAL | Status: DC | PRN

## 2021-01-22 MED ORDER — ONDANSETRON HCL 4 MG/2ML IJ SOLN
INTRAMUSCULAR | Status: AC
  Filled 2021-01-22: qty 2

## 2021-01-22 MED ORDER — FENTANYL CITRATE (PF) 100 MCG/2ML IJ SOLN
INTRAMUSCULAR | Status: DC | PRN
  Administered 2021-01-22 (×2): 50 ug via INTRAVENOUS

## 2021-01-22 MED ORDER — ONDANSETRON HCL 4 MG/2ML IJ SOLN
4.0000 mg | Freq: Four times a day (QID) | INTRAMUSCULAR | Status: DC | PRN
Start: 2021-01-22 — End: 2021-01-22

## 2021-01-22 MED ORDER — DEXAMETHASONE SODIUM PHOSPHATE 10 MG/ML IJ SOLN
INTRAMUSCULAR | Status: DC | PRN
  Administered 2021-01-22: 4 mg via INTRAVENOUS

## 2021-01-22 MED ORDER — PROPOFOL 10 MG/ML IV BOLUS
INTRAVENOUS | Status: DC | PRN
  Administered 2021-01-22: 50 mg via INTRAVENOUS
  Administered 2021-01-22: 30 mg via INTRAVENOUS
  Administered 2021-01-22: 120 mg via INTRAVENOUS

## 2021-01-22 MED ORDER — FENTANYL CITRATE (PF) 100 MCG/2ML IJ SOLN
INTRAMUSCULAR | Status: AC
  Filled 2021-01-22: qty 2

## 2021-01-22 MED ORDER — ROCURONIUM BROMIDE 10 MG/ML (PF) SYRINGE
PREFILLED_SYRINGE | INTRAVENOUS | Status: AC
  Filled 2021-01-22: qty 10

## 2021-01-22 MED ORDER — PROPOFOL 500 MG/50ML IV EMUL
INTRAVENOUS | Status: DC | PRN
  Administered 2021-01-22: 25 ug/kg/min via INTRAVENOUS

## 2021-01-22 MED ORDER — BUPIVACAINE HCL 0.25 % IJ SOLN
INTRAMUSCULAR | Status: DC | PRN
  Administered 2021-01-22: 10 mL

## 2021-01-22 MED ORDER — LIDOCAINE HCL (PF) 2 % IJ SOLN
INTRAMUSCULAR | Status: AC
  Filled 2021-01-22: qty 5

## 2021-01-22 MED ORDER — FAMOTIDINE 20 MG PO TABS
ORAL_TABLET | ORAL | Status: AC
  Administered 2021-01-22: 20 mg via ORAL
  Filled 2021-01-22: qty 1

## 2021-01-22 MED ORDER — ONDANSETRON HCL 4 MG/2ML IJ SOLN: 4.0000 mg | Freq: Once | INTRAMUSCULAR | Status: DC | PRN

## 2021-01-22 MED ORDER — ROCURONIUM BROMIDE 100 MG/10ML IV SOLN
INTRAVENOUS | Status: DC | PRN
  Administered 2021-01-22: 20 mg via INTRAVENOUS
  Administered 2021-01-22: 10 mg via INTRAVENOUS
  Administered 2021-01-22: 40 mg via INTRAVENOUS

## 2021-01-22 MED ORDER — SUGAMMADEX SODIUM 200 MG/2ML IV SOLN
INTRAVENOUS | Status: DC | PRN
  Administered 2021-01-22: 100 mg via INTRAVENOUS

## 2021-01-22 MED ORDER — DEXAMETHASONE SODIUM PHOSPHATE 10 MG/ML IJ SOLN
INTRAMUSCULAR | Status: AC
  Filled 2021-01-22: qty 1

## 2021-01-22 MED ORDER — SEVOFLURANE IN SOLN
RESPIRATORY_TRACT | Status: AC
  Filled 2021-01-22: qty 250

## 2021-01-22 MED ORDER — BUPIVACAINE LIPOSOME 1.3 % IJ SUSP
INTRAMUSCULAR | Status: DC | PRN
  Administered 2021-01-22: 20 mL

## 2021-01-22 MED ORDER — LIDOCAINE-EPINEPHRINE 1 %-1:100000 IJ SOLN
INTRAMUSCULAR | Status: AC
  Filled 2021-01-22: qty 1

## 2021-01-22 SURGICAL SUPPLY — 94 items
BIT DRILL 2 FENESTRATED (MISCELLANEOUS) ×2 IMPLANT
BIT DRILL CANNULTD 2.6 X 130MM (DRILL) ×2 IMPLANT
BIT DRILL SOLID 2.0 X 110MM (DRILL) ×2 IMPLANT
BIT DRILLL 2 FENESTRATED (MISCELLANEOUS) ×1
BLADE OSC/SAGITTAL 5.5X25 (BLADE) ×3 IMPLANT
BLADE OSC/SAGITTAL MD 9X18.5 (BLADE) ×3 IMPLANT
BLADE SURG 15 STRL LF DISP TIS (BLADE) ×4 IMPLANT
BLADE SURG 15 STRL SS (BLADE) ×6
BNDG CMPR STD VLCR NS LF 5.8X4 (GAUZE/BANDAGES/DRESSINGS) ×4
BNDG COHESIVE 4X5 TAN STRL (GAUZE/BANDAGES/DRESSINGS) ×3 IMPLANT
BNDG CONFORM 2 STRL LF (GAUZE/BANDAGES/DRESSINGS) ×3 IMPLANT
BNDG CONFORM 3 STRL LF (GAUZE/BANDAGES/DRESSINGS) ×3 IMPLANT
BNDG ELASTIC 4X5.8 VLCR NS LF (GAUZE/BANDAGES/DRESSINGS) ×6 IMPLANT
BNDG ESMARK 4X12 TAN STRL LF (GAUZE/BANDAGES/DRESSINGS) ×3 IMPLANT
BNDG GAUZE 4.5X4.1 6PLY STRL (MISCELLANEOUS) ×3 IMPLANT
BUR 4X45 EGG (BURR) ×3 IMPLANT
CANISTER SUCT 1200ML W/VALVE (MISCELLANEOUS) ×3 IMPLANT
CLIP FIXATION STAPLE 10X10X10 (Staple) ×3 IMPLANT
COUNTERSICK 4.0 HEADED (MISCELLANEOUS) ×3
COVER PIN YLW 0.028-062 (MISCELLANEOUS) ×6 IMPLANT
COVER WAND RF STERILE (DRAPES) ×3 IMPLANT
CUFF TOURN SGL QUICK 12 (TOURNIQUET CUFF) IMPLANT
CUFF TOURN SGL QUICK 18X4 (TOURNIQUET CUFF) IMPLANT
CUFF TOURN SGL QUICK 24 (TOURNIQUET CUFF)
CUFF TRNQT CYL 24X4X16.5-23 (TOURNIQUET CUFF) IMPLANT
DRAPE C-ARM XRAY 36X54 (DRAPES) ×3 IMPLANT
DRAPE C-ARMOR (DRAPES) ×3 IMPLANT
DRAPE FLUOR MINI C-ARM 54X84 (DRAPES) ×3 IMPLANT
DRILL CANNULATED 2.6 X 130MM (DRILL) ×3
DRILL SOLID 2.0 X 110MM (DRILL) ×3
DURAPREP 26ML APPLICATOR (WOUND CARE) ×3 IMPLANT
ELECT REM PT RETURN 9FT ADLT (ELECTROSURGICAL) ×3
ELECTRODE REM PT RTRN 9FT ADLT (ELECTROSURGICAL) ×2 IMPLANT
GAUZE 4X4 16PLY RFD (DISPOSABLE) ×6 IMPLANT
GAUZE SPONGE 4X4 12PLY STRL (GAUZE/BANDAGES/DRESSINGS) ×3 IMPLANT
GAUZE XEROFORM 1X8 LF (GAUZE/BANDAGES/DRESSINGS) ×3 IMPLANT
GLOVE SURG ENC MOIS LTX SZ7.5 (GLOVE) ×3 IMPLANT
GLOVE SURG UNDER LTX SZ8 (GLOVE) ×3 IMPLANT
GOWN STRL REUS W/ TWL XL LVL3 (GOWN DISPOSABLE) ×4 IMPLANT
GOWN STRL REUS W/TWL XL LVL3 (GOWN DISPOSABLE) ×6
GRAFT TRIN ELITE MED MUSC TRAN (Graft) ×3 IMPLANT
K-WIRE SMOOTH TROCAR 2.0X150 (WIRE) ×6
K-WIRE SNGL END 1.2X150 (MISCELLANEOUS) ×6
KIT PROCEDURE DRILL (DRILL) ×3 IMPLANT
KIT TURNOVER KIT A (KITS) ×3 IMPLANT
KWIRE SMOOTH TROCAR 2.0X150 (WIRE) ×4 IMPLANT
KWIRE SNGL END 1.2X150 (MISCELLANEOUS) ×4 IMPLANT
LABEL OR SOLS (LABEL) ×3 IMPLANT
MANIFOLD NEPTUNE II (INSTRUMENTS) ×3 IMPLANT
NDL SAFETY ECLIPSE 18X1.5 (NEEDLE) ×2 IMPLANT
NEEDLE FILTER BLUNT 18X 1/2SAF (NEEDLE) ×1
NEEDLE FILTER BLUNT 18X1 1/2 (NEEDLE) ×2 IMPLANT
NEEDLE HYPO 18GX1.5 SHARP (NEEDLE) ×3
NEEDLE HYPO 25X1 1.5 SAFETY (NEEDLE) ×9 IMPLANT
NS IRRIG 1000ML POUR BTL (IV SOLUTION) ×6 IMPLANT
NS IRRIG 500ML POUR BTL (IV SOLUTION) ×3 IMPLANT
PACK EXTREMITY ARMC (MISCELLANEOUS) ×3 IMPLANT
PADDING CAST BLEND 4X4 NS (MISCELLANEOUS) ×3 IMPLANT
PENCIL ELECTRO HAND CTR (MISCELLANEOUS) ×3 IMPLANT
PLATE 4H SLANT RIGHT (Plate) ×3 IMPLANT
PLATE SLANTED STR 3H RIGHT (Plate) ×3 IMPLANT
PLATE TMT CLOVER LFT 6H (Plate) ×3 IMPLANT
SCREW 2.7 X 13 NON LOCK (Screw) ×3 IMPLANT
SCREW 4.0X30MM (Screw) ×3 IMPLANT
SCREW BN ST 30X4XCANN HD (Screw) ×2 IMPLANT
SCREW COUNTERSINK 4.0 HEADED (MISCELLANEOUS) ×2 IMPLANT
SCREW LOCK PLATE R3 2.7X13 (Screw) ×3 IMPLANT
SCREW LOCK PLATE R3 2.7X14 (Screw) ×9 IMPLANT
SCREW LOCK PLATE R3 2.7X16 (Screw) ×3 IMPLANT
SCREW LOCK PLATE R3 2.7X18 (Screw) ×6 IMPLANT
SCREW LOCK PLATE R3 2.7X20 (Screw) ×6 IMPLANT
SCREW LOCK PLATE R3 2.7X24 (Screw) ×3 IMPLANT
SCREW LOCK PLATE R3 2.7X26 (Screw) ×3 IMPLANT
SCREW NON LOCK PLATE 2.7X16M (Screw) ×3 IMPLANT
SCREW NONLOCKING PLATE 2.7X24 (Screw) ×3 IMPLANT
SPLINT CAST 1 STEP 4X30 (MISCELLANEOUS) ×3 IMPLANT
SPLINT FAST PLASTER 5X30 (CAST SUPPLIES) ×1
SPLINT PLASTER CAST FAST 5X30 (CAST SUPPLIES) ×2 IMPLANT
STOCKINETTE M/LG 89821 (MISCELLANEOUS) ×3 IMPLANT
STRIP CLOSURE SKIN 1/4X4 (GAUZE/BANDAGES/DRESSINGS) ×3 IMPLANT
SUT ETHILON 3-0 FS-10 30 BLK (SUTURE) ×9
SUT ETHILON 4-0 (SUTURE) ×3
SUT ETHILON 4-0 FS2 18XMFL BLK (SUTURE) ×2
SUT VIC AB 3-0 SH 27 (SUTURE) ×9
SUT VIC AB 3-0 SH 27X BRD (SUTURE) ×6 IMPLANT
SUT VIC AB 4-0 FS2 27 (SUTURE) ×3 IMPLANT
SUT VICRYL AB 3-0 FS1 BRD 27IN (SUTURE) ×3 IMPLANT
SUTURE EHLN 3-0 FS-10 30 BLK (SUTURE) ×6 IMPLANT
SUTURE ETHLN 4-0 FS2 18XMF BLK (SUTURE) ×2 IMPLANT
SYR 10ML LL (SYRINGE) ×6 IMPLANT
TOWEL OR 17X26 4PK STRL BLUE (TOWEL DISPOSABLE) ×3 IMPLANT
WIRE OLIVE SMOOTH 1.4MMX60MM (WIRE) ×6 IMPLANT
WIRE Z .045 C-WIRE SPADE TIP (WIRE) ×6 IMPLANT
WIRE Z .062 C-WIRE SPADE TIP (WIRE) ×9 IMPLANT

## 2021-01-22 NOTE — Op Note (Signed)
Operative note   Surgeon:Abrham Maslowski Lawyer: None    Preop diagnosis: 1.  Severe osteoarthritis right first met cuneiform second cuneiform third met cuneiform and medial intercuneiform joint 2.  Hallux valgus deformity right great toe    Postop diagnosis: Same    Procedure: 1.  Arthrodesis first met cuneiform joint 2.  Arthrodesis second met cuneiform joint 3.  Arthrodesis third met cuneiform joint 4.  Arthrodesis medial to intermediate cuneiform joint 5.  Modified McBride hallux valgus correction 6.  Akin osteotomy proximal phalanx all right foot    EBL: 30 cc    Anesthesia:local and general.  Local consisted of a total of 10 cc of 0.25% bupivacaine and 20 cc of Exparel long-acting anesthetic    Hemostasis: Mid calf tourniquet inflated to 200 mmHg for approximately 40 minutes.  This was then deflated for approximately 1 hour.  Reinflated to 250 mmHg after for an additional 85 minutes   Specimen: Medial joint capsule possible gout    Complications: None    Operative indications:Tamara Villegas is an 75 y.o. that presents today for surgical intervention.  The risks/benefits/alternatives/complications have been discussed and consent has been given.    Procedure:  Patient was brought into the OR and placed on the operating table in thesupine position. After anesthesia was obtained theright lower extremity was prepped and draped in usual sterile fashion.  Attention was initially directed to the dorsal right midfoot where a longitudinal incision was made from the first metatarsocuneiform joint as well as a second longitudinal incision between the second and third met cuneiform joint regions.  Sharp and blunt dissection carried down to the periosteum.  Subperiosteal dissection was undertaken to all areas.  There was noted to be severe damage of the articular cartilage of all of the joints.  Displacement of the second met cuneiform and intercuneiform joints were noted at this time.  All  joints were then prepped and articular cartilage was removed.  A 2.0 mm drill bit was used to further prep the joints.  The joints were then packed with Trinity bone graft.  A dorsal first met cuneiform plate second met cuneiform plate and third met cuneiform plates were all placed with 2.7 millimeter screws.  Good alignment was noted.  4.0 mm pression screw from the medial cuneiform to the intermediate cuneiform was also placed for stabilization.  Good realignment and stabilization was noted in all planes.  Attention was directed to the great toe where the dorsomedial incision was made overlying the capsule.  Sharp and blunt dissection carried down to the capsule.  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  The first intermetatarsal space was entered.  The DTI L was transected.  The conjoined tendon of the abductor was released.  A capsulotomy was performed laterally.  Better realignment and flexibility of the great toe joint was noted.  The proximal phalanx was then dissected out.  A small Akin osteotomy was made in the midshaft.  This realigned the great toe.  This was stabilized with a 10 mm compression bone staple.  All wounds were flushed with copious amounts of irrigation.  Layered closure was performed with 3-0 Vicryl all of the deeper layers and a 3-0 nylon for the skin.  Exparel long-acting anesthetic was infiltrated along all areas.  A compression bandage was then applied.  Patient was placed in an equalizer walker boot.    Patient tolerated the procedure  and anesthesia well.  Was transported from the OR to the PACU with all vital signs stable and vascular status intact. To be discharged per routine protocol.  Will follow up in approximately 1 week in the outpatient clinic.

## 2021-01-22 NOTE — Anesthesia Procedure Notes (Signed)
Procedure Name: Intubation Date/Time: 01/22/2021 7:43 AM Performed by: Babs Sciara, CRNA Pre-anesthesia Checklist: Patient identified, Emergency Drugs available, Suction available, Patient being monitored and Timeout performed Patient Re-evaluated:Patient Re-evaluated prior to induction Oxygen Delivery Method: Circle system utilized Preoxygenation: Pre-oxygenation with 100% oxygen Induction Type: IV induction Ventilation: Mask ventilation without difficulty and Oral airway inserted - appropriate to patient size Laryngoscope Size: Mac and 3 Grade View: Grade I Tube type: Oral Tube size: 7.0 mm Number of attempts: 1 Airway Equipment and Method: Stylet Placement Confirmation: ETT inserted through vocal cords under direct vision,  breath sounds checked- equal and bilateral and positive ETCO2 Secured at: 22 (lip) cm Tube secured with: Tape Dental Injury: Teeth and Oropharynx as per pre-operative assessment

## 2021-01-22 NOTE — H&P (Signed)
HISTORY AND PHYSICAL INTERVAL NOTE:  01/22/2021  7:24 AM  Tamara Villegas  has presented today for surgery, with the diagnosis of E11.40- TYPE 2 DIABETES, CONRTOLLED, WITH NEUROPATHY M19.271- OTHER SECONDARY OSTEOARTHRITIS OF RIGHT FOOT M20.11- HALLUX VALGUS OF RIGHT FOOT.  The various methods of treatment have been discussed with the patient.  No guarantees were given.  After consideration of risks, benefits and other options for treatment, the patient has consented to surgery.  I have reviewed the patients' chart and labs.     A history and physical examination was performed in my office.  The patient was reexamined.  There have been no changes to this history and physical examination.  [name] A

## 2021-01-22 NOTE — Transfer of Care (Signed)
Immediate Anesthesia Transfer of Care Note  Patient: Tamara Villegas  Procedure(s) Performed: ARTHRODESIS; LISFRANC; MULTIPLE - R (Right Foot) MODIFIED MCBRIDE/KELLER - R (Right Toe) PHALANX OSTEOTOMY-AKIN - R (Right Toe)  Patient Location: PACU  Anesthesia Type:General  Level of Consciousness: awake, alert  and oriented  Airway & Oxygen Therapy: Patient Spontanous Breathing and Patient connected to face mask oxygen  Post-op Assessment: Report given to RN and Post -op Vital signs reviewed and stable  Post vital signs: Reviewed and stable  Last Vitals:  Vitals Value Taken Time  BP 149/65 01/22/21 1130  Temp 36.9 C 01/22/21 1127  Pulse 85 01/22/21 1132  Resp 17 01/22/21 1132  SpO2 90 % 01/22/21 1132  Vitals shown include unvalidated device data.  Last Pain:  Vitals:   01/22/21 1127  TempSrc:   PainSc: Asleep         Complications: No complications documented.

## 2021-01-22 NOTE — Anesthesia Preprocedure Evaluation (Signed)
Anesthesia Evaluation  Patient identified by MRN, date of birth, ID band Patient awake    Reviewed: Allergy & Precautions, NPO status , Patient's Chart, lab work & pertinent test results  History of Anesthesia Complications Negative for: history of anesthetic complications  Airway Mallampati: III       Dental  (+) Upper Dentures, Partial Lower   Pulmonary neg sleep apnea, neg COPD, Not current smoker, former smoker,    Pulmonary exam normal        Cardiovascular hypertension, Pt. on medications + CAD, + Cardiac Stents and + DOE  (-) Past MI and (-) CHF Normal cardiovascular exam(-) dysrhythmias + Valvular Problems/Murmurs AS      Neuro/Psych neg Seizures  Neuromuscular disease negative psych ROS   GI/Hepatic negative GI ROS, neg GERD  ,(+) Hepatitis -, B  Endo/Other  diabetes, Type 2  Renal/GU Renal InsufficiencyRenal disease  negative genitourinary   Musculoskeletal   Abdominal Normal abdominal exam  (+)   Peds negative pediatric ROS (+)  Hematology negative hematology ROS (+)   Anesthesia Other Findings   Reproductive/Obstetrics                             Anesthesia Physical  Anesthesia Plan  ASA: III  Anesthesia Plan: General   Post-op Pain Management:    Induction: Intravenous  PONV Risk Score and Plan: 3 and Ondansetron, Dexamethasone and Treatment may vary due to age or medical condition  Airway Management Planned: Oral ETT and Video Laryngoscope Planned  Additional Equipment:   Intra-op Plan:   Post-operative Plan: Extubation in OR  Informed Consent: I have reviewed the patients History and Physical, chart, labs and discussed the procedure including the risks, benefits and alternatives for the proposed anesthesia with the patient or authorized representative who has indicated his/her understanding and acceptance.       Plan Discussed with: CRNA and  Surgeon  Anesthesia Plan Comments:         Anesthesia Quick Evaluation

## 2021-01-22 NOTE — Discharge Instructions (Signed)
Information for Discharge Teaching: DO NOT REMOVE TEAL BRACELET FOR 4 DAYS (96 hours) EXPAREL (bupivacaine liposome injectable suspension)   Your surgeon or anesthesiologist gave you EXPAREL(bupivacaine) to help control your pain after surgery.   EXPAREL is a local anesthetic that provides pain relief by numbing the tissue around the surgical site.  EXPAREL is designed to release pain medication over time and can control pain for up to 72 hours.  Depending on how you respond to EXPAREL, you may require less pain medication during your recovery.  Possible side effects:  Temporary loss of sensation or ability to move in the area where bupivacaine was injected.  Nausea, vomiting, constipation  Rarely, numbness and tingling in your mouth or lips, lightheadedness, or anxiety may occur.  Call your doctor right away if you think you may be experiencing any of these sensations, or if you have other questions regarding possible side effects.  Follow all other discharge instructions given to you by your surgeon or nurse. Eat a healthy diet and drink plenty of water or other fluids.  If you return to the hospital for any reason within 96 hours following the administration of EXPAREL, it is important for health care providers to know that you have received this anesthetic. A teal colored band has been placed on your arm with the date, time and amount of EXPAREL you have received in order to alert and inform your health care providers. Please leave this armband in place for the full 96 hours following administration, and then you may remove the band.  [place]  POST OPERATIVE INSTRUCTIONS FOR DR. Vickki Muff AND DR. [place]   1. Take your medication as prescribed.  Pain medication should be taken only as needed.  2. Keep the dressing clean, dry and intact.  3. Keep your foot elevated above the heart level for the first  48 hours.  4. We have instructed you to be non-weight bearing.  5.  Always use your crutches if you are to be non-weight bearing.   6. Every hour you are awake:  - Bend your knee 15 times.  7. Call [facility] [phone]) if any of the following problems occur: - You develop a temperature or fever. - The bandage becomes saturated with blood. - Medication does not stop your pain. - Injury of the foot occurs. Any symptoms of infection including redness, odor, or red streaks running from wound.    AMBULATORY SURGERY  DISCHARGE INSTRUCTIONS   1) The drugs that you were given will stay in your system until tomorrow so for the next 24 hours you should not:  A) Drive an automobile B) Make any legal decisions C) Drink any alcoholic beverage   2) You may resume regular meals tomorrow.  Today it is better to start with liquids and gradually work up to solid foods.  You may eat anything you prefer, but it is better to start with liquids, then soup and crackers, and gradually work up to solid foods.   3) Please notify your doctor immediately if you have any unusual bleeding, trouble breathing, redness and pain at the surgery site, drainage, fever, or pain not relieved by medication.    4) Additional Instructions:        Please contact your physician with any problems or Same Day Surgery at [phone], Monday through Friday 6 am to 4 pm, or [place] at [facility] number at [phone].

## 2021-01-25 ENCOUNTER — Encounter: Payer: Self-pay | Admitting: Podiatry

## 2021-01-25 LAB — SURGICAL PATHOLOGY

## 2021-01-25 NOTE — Anesthesia Postprocedure Evaluation (Signed)
Anesthesia Post Note  Patient: [place]  Procedure(s) Performed: ARTHRODESIS; LISFRANC; MULTIPLE - R (Right Foot) MODIFIED MCBRIDE/KELLER - R (Right Toe) PHALANX OSTEOTOMY-AKIN - R (Right Toe)  Patient location during evaluation: PACU Anesthesia Type: General Level of consciousness: awake and alert and oriented Pain management: pain level controlled Vital Signs Assessment: post-procedure vital signs reviewed and stable Respiratory status: spontaneous breathing Cardiovascular status: blood pressure returned to baseline Anesthetic complications: no   No complications documented.   Last Vitals:  Vitals:   01/22/21 1200 01/22/21 1220  BP: 140/65 (!) 160/63  Pulse: 73 73  Resp: 11 16  Temp: 36.8 C 36.4 C  SpO2: 96% 99%    Last Pain:  Vitals:   01/22/21 1220  TempSrc: Temporal  PainSc:                  Angela Vazguez

## 2021-04-22 ENCOUNTER — Other Ambulatory Visit: Payer: Self-pay | Admitting: Podiatry

## 2021-04-27 ENCOUNTER — Other Ambulatory Visit: Payer: Self-pay

## 2021-04-27 ENCOUNTER — Encounter
Admission: RE | Admit: 2021-04-27 | Discharge: 2021-04-27 | Disposition: A | Discharge status: Home / Self Care | Payer: BC Managed Care – PPO | Source: Ambulatory Visit | Attending: Podiatry | Admitting: Podiatry

## 2021-04-27 NOTE — Patient Instructions (Addendum)
Your procedure is scheduled on: Friday 8/19 Report to the Registration Desk on the 1st floor of the [facility]. To find out your arrival time, please call [phone] between 1PM - 3PM on: Thursday 8/18  REMEMBER: Instructions that are not followed completely may result in serious medical risk, up to and including death; or upon the discretion of your surgeon and anesthesiologist your surgery may need to be rescheduled.  Do not eat food after midnight the night before surgery.  No gum chewing, lozengers or hard candies.  You may however, drink water up to 2 hours before you are scheduled to arrive for your surgery. Do not drink anything within 2 hours of your scheduled arrival time.  TAKE THESE MEDICATIONS THE MORNING OF SURGERY WITH A SIP OF WATER:  Cymbalta Zetia Tylenol or Oxycodone if needed for pain Timolol ophthalmic solution  Follow recommendations from Cardiologist, Pulmonologist or PCP regarding stopping Aspirin.  One week prior to surgery: Stop Anti-inflammatories (NSAIDS) such as Advil, Aleve, Ibuprofen, Motrin, Naproxen, Naprosyn and Aspirin based products such as Excedrin, Goodys Powder, BC Powder. Stop ANY OVER THE COUNTER supplements until after surgery. You may however, continue to take Tylenol if needed for pain up until the day of surgery.  No Alcohol for 24 hours before or after surgery.  No Smoking including e-cigarettes for 24 hours prior to surgery.  No chewable tobacco products for at least 6 hours prior to surgery.  No nicotine patches on the day of surgery.  Do not use any "recreational" drugs for at least a week prior to your surgery.  Please be advised that the combination of cocaine and anesthesia may have negative outcomes, up to and including death. If you test positive for cocaine, your surgery will be cancelled.  On the morning of surgery brush your teeth with toothpaste and water, you may rinse your mouth with mouthwash if you wish. Do not  swallow any toothpaste or mouthwash.  Do not wear jewelry, make-up, hairpins, clips or nail polish.  Do not wear lotions, powders, or perfumes.   Do not shave body from the neck down 48 hours prior to surgery just in case you cut yourself which could leave a site for infection.   Contact lenses, hearing aids and dentures may not be worn into surgery.  Do not bring valuables to the hospital. [facility] is not responsible for any missing/lost belongings or valuables.   Shower using antibacterial Soap prior to arrival on the day of surgery  Notify your doctor if there is any change in your medical condition (cold, fever, infection).  Wear comfortable clothing (specific to your surgery type) to the hospital.  After surgery, you can help prevent lung complications by doing breathing exercises.  Take deep breaths and cough every 1-2 hours. Your doctor may order a device called an Incentive Spirometer to help you take deep breaths.  If you are being discharged the day of surgery, you will not be allowed to drive home. You will need a responsible adult (18 years or older) to drive you home and stay with you that night.   If you are taking public transportation, you will need to have a responsible adult (18 years or older) with you. Please confirm with your physician that it is acceptable to use public transportation.   Please call the [place] Dept. at [phone] if you have any questions about these instructions.  Surgery Visitation Policy:  Patients undergoing a surgery or procedure may have one  family member or support person with them as long as that person is not COVID-19 positive or experiencing its symptoms.  That person may remain in the waiting area during the procedure.

## 2021-04-28 NOTE — Progress Notes (Signed)
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 Justin Fowler, DPM.  Patient previously reviewed by PAT APP prior to similar podiatric procedure that was performed on 01/22/2021; see my note dated 01/20/2021. Interval health history/records reviewed.   Patient underwent an uncomplicated podiatric procedure on 01/22/2021. She discharged home on POD-0.  Patient has been seen in follow-up consult by podiatry.  Her last visit in the podiatry clinic was on 04/21/2021; notes reviewed.  Patient with increased pain and swelling in her RIGHT foot.  Podiatrist determined that patient experiencing a nonunion at the fusion site with further displacement of midfoot fusion region.  Need for revisional surgery discussed, including that patient not likely to experience any benefit from the initial surgery until the displacement is corrected.  Surgical plan to perform midfoot revision surgery with removal of loosened hardware discussed.  Patient aware that further hardware, bone grafting, and external stimulator may be required.  Patient verbalized understanding and elected to proceed with revisional surgery.  Patient with a history of cardiovascular diagnoses.  She was previously cleared by her primary cardiologist (Paraschos, MD) at an overall LOW risk.  Given the length of time that is been since initial clearance was issued, updated cardiac clearance was sought from cardiology by the PAT team.   Per cardiology, "this patient is optimized for surgery and may proceed with the planned procedural course with a LOW risk of significant perioperative cardiovascular complications". Again, this patient is on daily antiplatelet therapy.  She has been instructed on recommendations for holding her daily low-dose ASA for 5 days prior to her procedure with plans to restart as soon as postoperative bleeding risk felt to be minimized by her primary attending surgeon.  Patient is aware that her last dose of ASA will be on 04/24/2021.  Pertinent Clinical Results:   Lab Results  Component Value Date   WBC 6.3 01/20/2021   HGB 12.2 01/22/2021   HCT 36.0 01/22/2021   MCV 89.8 01/20/2021   PLT 258 01/20/2021   Lab Results  Component Value Date   NA 140 01/22/2021   K 3.7 01/22/2021   CO2 25 01/20/2021   GLUCOSE 151 (H) 01/22/2021   BUN 13 01/22/2021   CREATININE 0.80 01/22/2021   CALCIUM 8.9 01/20/2021   GFRNONAA >60 01/20/2021    ECG: Date: 01/20/2021 Time ECG obtained: 0813 AM Rate: 69 bpm Rhythm: normal sinus Axis (leads I and aVF): Normal Intervals: PR 132 ms. QRS 80 ms. QTc 430 ms. ST segment and T wave changes: No evidence of acute ST segment elevation or depression Comparison: Similar to previous tracing obtained on 08/05/2020  IMAGING / PROCEDURES: DIAGNOSTIC RADIOGRAPHS RIGHT FOOT 3+ VIEWS performed on 04/21/2021 Distal phalanx osteotomy with compression staple.  Good alignment noted. 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  Good alignment is noted. Solid consolidation along the first met cuneiform and second cuneiforms are noted Interval consolidation with some residual mild joint space still present at the second cuneiform area No signs of motion to the hardware at all Remainder of the foot radiograph is negative for acute fracture Subtalar joints are well maintained  DIAGNOSTIC RADIOGRAPHS RIGHT 3+ VIEWS performed on 01/27/2021 Midtarsal joint arthrodesis with first, second, and third met cuneiform fusion with dorsal locking plates Hallux valgus correction has been performed at the proximal phalanx with compression screw Good alignment is noted on all views.  See repeat AP of the foot for better alignment of the first met cuneiform hardware Lesser metatarsal phalangeal joints are well-maintained Subtalar joint is well-maintained No signs of motion  Impression and Plan:  Tamara Villegas has been referred for pre-anesthesia review and clearance prior to her undergoing the planned anesthetic and procedural courses. Available labs, pertinent testing, and imaging results were personally reviewed by me. This patient has been appropriately cleared by cardiology with an overall LOW risk of significant perioperative cardiovascular complications.  Based on clinical review performed today (04/28/21), barring any significant acute changes in the patient's overall condition, it is anticipated that she will be able to proceed with the planned surgical intervention. Any acute changes in clinical condition may necessitate her procedure being postponed and/or cancelled. Patient will meet with anesthesia team (MD and/or CRNA) on this day of her procedure for preoperative evaluation/assessment.   Pre-surgical instructions were reviewed with  the patient during her PAT appointment and questions were fielded by PAT clinical staff. Patient was advised that if any questions or concerns arise prior to her procedure then she should return a call to PAT and/or her surgeon's office to discuss.  Bryan Gray, MSN, APRN, FNP-C, CEN [facility]  Peri-operative Services Nurse Practitioner Phone: [phone] 04/28/21 8:38 AM  NOTE: This note has been prepared using Dragon dictation software. Despite my best ability to proofread, there is always the potential that unintentional transcriptional errors may still occur from this process. 

## 2021-04-29 MED ORDER — CEFAZOLIN SODIUM-DEXTROSE 2-4 GM/100ML-% IV SOLN
2.0000 g | INTRAVENOUS | Status: AC
Start: 2021-04-30 — End: 2021-04-30
  Administered 2021-04-30: 2 g via INTRAVENOUS

## 2021-04-30 ENCOUNTER — Encounter: Payer: Self-pay | Admitting: Podiatry

## 2021-04-30 ENCOUNTER — Ambulatory Visit: Payer: BC Managed Care – PPO

## 2021-04-30 ENCOUNTER — Encounter
Admission: RE | Disposition: A | Discharge status: Home / Self Care | Payer: Self-pay | Source: Home / Self Care | Attending: Podiatry

## 2021-04-30 ENCOUNTER — Ambulatory Visit: Payer: BC Managed Care – PPO | Admitting: Urgent Care

## 2021-04-30 ENCOUNTER — Ambulatory Visit
Admission: RE | Admit: 2021-04-30 | Discharge: 2021-04-30 | Disposition: A | Discharge status: Home / Self Care | Payer: BC Managed Care – PPO | Attending: Podiatry | Admitting: Podiatry

## 2021-04-30 ENCOUNTER — Other Ambulatory Visit: Payer: Self-pay

## 2021-04-30 DIAGNOSIS — E11610 Type 2 diabetes mellitus with diabetic neuropathic arthropathy: Secondary | ICD-10-CM | POA: Diagnosis not present

## 2021-04-30 DIAGNOSIS — E1140 Type 2 diabetes mellitus with diabetic neuropathy, unspecified: Secondary | ICD-10-CM | POA: Insufficient documentation

## 2021-04-30 DIAGNOSIS — M960 Pseudarthrosis after fusion or arthrodesis: Secondary | ICD-10-CM | POA: Insufficient documentation

## 2021-04-30 DIAGNOSIS — Y838 Other surgical procedures as the cause of abnormal reaction of the patient, or of later complication, without mention of misadventure at the time of the procedure: Secondary | ICD-10-CM | POA: Insufficient documentation

## 2021-04-30 HISTORY — PX: FOOT ARTHRODESIS: SHX1655

## 2021-04-30 LAB — GLUCOSE, CAPILLARY
Glucose-Capillary: 141 mg/dL — ABNORMAL HIGH (ref 70–99)
Glucose-Capillary: 164 mg/dL — ABNORMAL HIGH (ref 70–99)

## 2021-04-30 SURGERY — FUSION, JOINT, FOOT
Anesthesia: General | Site: Foot | Laterality: Right

## 2021-04-30 MED ORDER — SODIUM CHLORIDE 0.9 % IV SOLN: INTRAVENOUS | Status: DC

## 2021-04-30 MED ORDER — BUPIVACAINE HCL (PF) 0.5 % IJ SOLN
INTRAMUSCULAR | Status: AC
  Filled 2021-04-30: qty 30

## 2021-04-30 MED ORDER — LIDOCAINE HCL (CARDIAC) PF 100 MG/5ML IV SOSY
PREFILLED_SYRINGE | INTRAVENOUS | Status: DC | PRN
  Administered 2021-04-30: 60 mg via INTRAVENOUS

## 2021-04-30 MED ORDER — ORAL CARE MOUTH RINSE: 15.0000 mL | Freq: Once | OROMUCOSAL | Status: AC

## 2021-04-30 MED ORDER — OXYCODONE HCL 5 MG/5ML PO SOLN
5.0000 mg | Freq: Once | ORAL | Status: DC | PRN
Start: 2021-04-30 — End: 2021-04-30

## 2021-04-30 MED ORDER — FENTANYL CITRATE (PF) 100 MCG/2ML IJ SOLN
INTRAMUSCULAR | Status: AC
  Filled 2021-04-30: qty 2

## 2021-04-30 MED ORDER — GLYCOPYRROLATE 0.2 MG/ML IJ SOLN
INTRAMUSCULAR | Status: AC
  Filled 2021-04-30: qty 1

## 2021-04-30 MED ORDER — METOCLOPRAMIDE HCL 5 MG/ML IJ SOLN: 5.0000 mg | Freq: Three times a day (TID) | INTRAMUSCULAR | Status: DC | PRN

## 2021-04-30 MED ORDER — OXYCODONE HCL 5 MG PO TABS: 5.0000 mg | ORAL_TABLET | Freq: Once | ORAL | Status: DC | PRN

## 2021-04-30 MED ORDER — FENTANYL CITRATE (PF) 100 MCG/2ML IJ SOLN
INTRAMUSCULAR | Status: DC | PRN
  Administered 2021-04-30 (×4): 50 ug via INTRAVENOUS

## 2021-04-30 MED ORDER — METOCLOPRAMIDE HCL 10 MG PO TABS: 5.0000 mg | ORAL_TABLET | Freq: Three times a day (TID) | ORAL | Status: DC | PRN

## 2021-04-30 MED ORDER — CHLORHEXIDINE GLUCONATE 0.12 % MT SOLN: 15.0000 mL | Freq: Once | OROMUCOSAL | Status: AC

## 2021-04-30 MED ORDER — PHENYLEPHRINE HCL (PRESSORS) 10 MG/ML IV SOLN
INTRAVENOUS | Status: AC
  Filled 2021-04-30: qty 1

## 2021-04-30 MED ORDER — SEVOFLURANE IN SOLN
RESPIRATORY_TRACT | Status: AC
  Filled 2021-04-30: qty 250

## 2021-04-30 MED ORDER — DEXMEDETOMIDINE (PRECEDEX) IN NS 20 MCG/5ML (4 MCG/ML) IV SYRINGE
PREFILLED_SYRINGE | INTRAVENOUS | Status: AC
  Filled 2021-04-30: qty 5

## 2021-04-30 MED ORDER — POVIDONE-IODINE 7.5 % EX SOLN
Freq: Once | CUTANEOUS | Status: DC
  Filled 2021-04-30: qty 118

## 2021-04-30 MED ORDER — ONDANSETRON HCL 4 MG/2ML IJ SOLN: 4.0000 mg | Freq: Four times a day (QID) | INTRAMUSCULAR | Status: DC | PRN

## 2021-04-30 MED ORDER — BUPIVACAINE LIPOSOME 1.3 % IJ SUSP
INTRAMUSCULAR | Status: AC
  Filled 2021-04-30: qty 20

## 2021-04-30 MED ORDER — ONDANSETRON HCL 4 MG PO TABS: 4.0000 mg | ORAL_TABLET | Freq: Four times a day (QID) | ORAL | Status: DC | PRN

## 2021-04-30 MED ORDER — CEFAZOLIN SODIUM-DEXTROSE 2-4 GM/100ML-% IV SOLN
INTRAVENOUS | Status: AC
  Filled 2021-04-30: qty 100

## 2021-04-30 MED ORDER — PROPOFOL 10 MG/ML IV BOLUS
INTRAVENOUS | Status: DC | PRN
  Administered 2021-04-30: 120 mg via INTRAVENOUS

## 2021-04-30 MED ORDER — ROCURONIUM BROMIDE 100 MG/10ML IV SOLN
INTRAVENOUS | Status: DC | PRN
  Administered 2021-04-30: 20 mg via INTRAVENOUS
  Administered 2021-04-30: 50 mg via INTRAVENOUS

## 2021-04-30 MED ORDER — ONDANSETRON HCL 4 MG/2ML IJ SOLN
INTRAMUSCULAR | Status: DC | PRN
  Administered 2021-04-30: 4 mg via INTRAVENOUS

## 2021-04-30 MED ORDER — PROPOFOL 10 MG/ML IV BOLUS
INTRAVENOUS | Status: AC
  Filled 2021-04-30: qty 20

## 2021-04-30 MED ORDER — EPHEDRINE 5 MG/ML INJ
INTRAVENOUS | Status: AC
  Filled 2021-04-30: qty 5

## 2021-04-30 MED ORDER — 0.9 % SODIUM CHLORIDE (POUR BTL) OPTIME
TOPICAL | Status: DC | PRN
  Administered 2021-04-30 (×2): 1000 mL
  Administered 2021-04-30: 500 mL

## 2021-04-30 MED ORDER — LIDOCAINE HCL (PF) 2 % IJ SOLN
INTRAMUSCULAR | Status: AC
  Filled 2021-04-30: qty 5

## 2021-04-30 MED ORDER — BUPIVACAINE-EPINEPHRINE (PF) 0.5% -1:200000 IJ SOLN
INTRAMUSCULAR | Status: AC
  Filled 2021-04-30: qty 30

## 2021-04-30 MED ORDER — ROCURONIUM BROMIDE 10 MG/ML (PF) SYRINGE
PREFILLED_SYRINGE | INTRAVENOUS | Status: AC
  Filled 2021-04-30: qty 10

## 2021-04-30 MED ORDER — CHLORHEXIDINE GLUCONATE 0.12 % MT SOLN
OROMUCOSAL | Status: AC
  Administered 2021-04-30: 15 mL via OROMUCOSAL
  Filled 2021-04-30: qty 15

## 2021-04-30 MED ORDER — DEXAMETHASONE SODIUM PHOSPHATE 10 MG/ML IJ SOLN
INTRAMUSCULAR | Status: DC | PRN
  Administered 2021-04-30: 4 mg via INTRAVENOUS

## 2021-04-30 MED ORDER — SODIUM CHLORIDE 0.9 % IV SOLN
INTRAVENOUS | Status: DC | PRN
  Administered 2021-04-30: 50 ug/min via INTRAVENOUS

## 2021-04-30 MED ORDER — BUPIVACAINE-EPINEPHRINE 0.5% -1:200000 IJ SOLN
INTRAMUSCULAR | Status: DC | PRN
  Administered 2021-04-30: 20 mL

## 2021-04-30 MED ORDER — ONDANSETRON HCL 4 MG/2ML IJ SOLN
INTRAMUSCULAR | Status: AC
  Filled 2021-04-30: qty 2

## 2021-04-30 MED ORDER — PHENYLEPHRINE HCL (PRESSORS) 10 MG/ML IV SOLN
INTRAVENOUS | Status: DC | PRN
  Administered 2021-04-30: 200 ug via INTRAVENOUS
  Administered 2021-04-30: 100 ug via INTRAVENOUS
  Administered 2021-04-30: 200 ug via INTRAVENOUS
  Administered 2021-04-30 (×4): 100 ug via INTRAVENOUS

## 2021-04-30 MED ORDER — PROMETHAZINE HCL 25 MG/ML IJ SOLN: 6.2500 mg | INTRAMUSCULAR | Status: DC | PRN

## 2021-04-30 MED ORDER — BUPIVACAINE HCL (PF) 0.5 % IJ SOLN
INTRAMUSCULAR | Status: DC | PRN
  Administered 2021-04-30: 10 mL

## 2021-04-30 MED ORDER — FENTANYL CITRATE (PF) 100 MCG/2ML IJ SOLN: 25.0000 ug | INTRAMUSCULAR | Status: DC | PRN

## 2021-04-30 MED ORDER — MEPERIDINE HCL 25 MG/ML IJ SOLN: 6.2500 mg | INTRAMUSCULAR | Status: DC | PRN

## 2021-04-30 MED ORDER — FAMOTIDINE 20 MG PO TABS
ORAL_TABLET | ORAL | Status: AC
  Administered 2021-04-30: 20 mg via ORAL
  Filled 2021-04-30: qty 1

## 2021-04-30 MED ORDER — DEXMEDETOMIDINE (PRECEDEX) IN NS 20 MCG/5ML (4 MCG/ML) IV SYRINGE
PREFILLED_SYRINGE | INTRAVENOUS | Status: DC | PRN
  Administered 2021-04-30: 8 ug via INTRAVENOUS
  Administered 2021-04-30: 12 ug via INTRAVENOUS

## 2021-04-30 MED ORDER — SUGAMMADEX SODIUM 200 MG/2ML IV SOLN
INTRAVENOUS | Status: DC | PRN
  Administered 2021-04-30: 200 mg via INTRAVENOUS

## 2021-04-30 MED ORDER — DEXAMETHASONE SODIUM PHOSPHATE 10 MG/ML IJ SOLN
INTRAMUSCULAR | Status: AC
  Filled 2021-04-30: qty 1

## 2021-04-30 MED ORDER — FAMOTIDINE 20 MG PO TABS: 20.0000 mg | ORAL_TABLET | Freq: Once | ORAL | Status: AC

## 2021-04-30 SURGICAL SUPPLY — 83 items
BIT DRILL 2 FENESTRATED (MISCELLANEOUS) ×1 IMPLANT
BIT DRILL 2.4X140 LONG SOLID (BIT) ×2 IMPLANT
BIT DRILL 2.8 (BIT) ×1
BIT DRILL LNG 140X2.8XSLD (BIT) ×1 IMPLANT
BIT DRILL OVER CANN SURG 4.2 (DRILL) ×1 IMPLANT
BIT DRILL SOLID 2.0 X 110MM (DRILL) ×1 IMPLANT
BIT DRILLL 2 FENESTRATED (MISCELLANEOUS) ×1
BIT DRL LNG 140X2.8XSLD (BIT) ×1
BLADE SURG 15 STRL LF DISP TIS (BLADE) ×2 IMPLANT
BLADE SURG 15 STRL SS (BLADE) ×2
BNDG COHESIVE 4X5 TAN ST LF (GAUZE/BANDAGES/DRESSINGS) ×2 IMPLANT
BNDG CONFORM 2 STRL LF (GAUZE/BANDAGES/DRESSINGS) ×2 IMPLANT
BNDG CONFORM 3 STRL LF (GAUZE/BANDAGES/DRESSINGS) ×2 IMPLANT
BNDG ELASTIC 4X5.8 VLCR NS LF (GAUZE/BANDAGES/DRESSINGS) ×4 IMPLANT
BNDG ESMARK 4X12 TAN STRL LF (GAUZE/BANDAGES/DRESSINGS) ×2 IMPLANT
BNDG GAUZE ELAST 4 BULKY (GAUZE/BANDAGES/DRESSINGS) ×2 IMPLANT
BUR 4X45 EGG (BURR) IMPLANT
COVER PIN YLW 0.028-062 (MISCELLANEOUS) IMPLANT
CUFF TOURN SGL QUICK 18X4 (TOURNIQUET CUFF) IMPLANT
CUFF TOURN SGL QUICK 24 (TOURNIQUET CUFF)
CUFF TRNQT CYL 24X4X16.5-23 (TOURNIQUET CUFF) IMPLANT
DRAPE C-ARM XRAY 36X54 (DRAPES) ×2 IMPLANT
DRAPE C-ARMOR (DRAPES) IMPLANT
DRILL OVER CANN SURG 4.2 (DRILL) ×2
DRILL SOLID 2.0 X 110MM (DRILL) ×2
DURAPREP 26ML APPLICATOR (WOUND CARE) ×2 IMPLANT
ELECT REM PT RETURN 9FT ADLT (ELECTROSURGICAL) ×2
ELECTRODE REM PT RTRN 9FT ADLT (ELECTROSURGICAL) ×1 IMPLANT
GAUZE 4X4 16PLY ~~LOC~~+RFID DBL (SPONGE) ×4 IMPLANT
GAUZE SPONGE 4X4 12PLY STRL (GAUZE/BANDAGES/DRESSINGS) ×2 IMPLANT
GAUZE XEROFORM 1X8 LF (GAUZE/BANDAGES/DRESSINGS) ×2 IMPLANT
GLOVE SURG ENC MOIS LTX SZ7.5 (GLOVE) ×2 IMPLANT
GLOVE SURG UNDER LTX SZ8 (GLOVE) ×2 IMPLANT
GOWN STRL REUS W/ TWL XL LVL3 (GOWN DISPOSABLE) ×2 IMPLANT
GOWN STRL REUS W/TWL XL LVL3 (GOWN DISPOSABLE) ×2
GRAFT TRIN ELITE MED MUSC TRAN (Graft) ×2 IMPLANT
KIT TURNOVER KIT A (KITS) ×2 IMPLANT
MANIFOLD NEPTUNE II (INSTRUMENTS) ×2 IMPLANT
NEEDLE FILTER BLUNT 18X 1/2SAF (NEEDLE)
NEEDLE FILTER BLUNT 18X1 1/2 (NEEDLE) IMPLANT
NEEDLE HYPO 25X1 1.5 SAFETY (NEEDLE) ×4 IMPLANT
NS IRRIG 1000ML POUR BTL (IV SOLUTION) ×4 IMPLANT
NS IRRIG 500ML POUR BTL (IV SOLUTION) ×2 IMPLANT
PACK EXTREMITY ARMC (MISCELLANEOUS) ×2 IMPLANT
PADDING CAST BLEND 4X4 NS (MISCELLANEOUS) ×2 IMPLANT
PLATE COMP 6H RT (Plate) ×2 IMPLANT
PLATE LAPIDUS 5H RT (Plate) ×2 IMPLANT
SCREW LOCK 3 3.5X18 (Screw) ×2 IMPLANT
SCREW LOCK PLATE R3 3.5X14 (Screw) ×2 IMPLANT
SCREW LOCK PLATE R3 3.5X16 (Screw) ×4 IMPLANT
SCREW LOCK PLATE R3 3.5X22 (Screw) ×2 IMPLANT
SCREW LOCK PLATE R3 3.5X24 (Screw) ×2 IMPLANT
SCREW LOCK PLATE R3 3.5X28 (Screw) ×2 IMPLANT
SCREW LOCK PLATE R3 4.2X30 (Screw) ×1 IMPLANT
SCREW LOCK PLATE R3 4.2X36 (Screw) ×2 IMPLANT
SCREW LOCK PLT 30X4.2X GRLL (Screw) ×1 IMPLANT
SCREW NLCK 28X3.5XNS R3CON NON (Screw) ×1 IMPLANT
SCREW NONLOCK 3.5X28 (Screw) ×1 IMPLANT
SCREW NONLOCK PLATE R3 4.2X34 (Screw) ×2 IMPLANT
SCREW R3CON N/L PLATE 3.5X44 (Screw) ×2 IMPLANT
SPLINT CAST 1 STEP 4X30 (MISCELLANEOUS) IMPLANT
SPLINT FAST PLASTER 5X30 (CAST SUPPLIES)
SPLINT PLASTER CAST FAST 5X30 (CAST SUPPLIES) IMPLANT
SPONGE T-LAP 18X18 ~~LOC~~+RFID (SPONGE) ×4 IMPLANT
STIMULATOR BONE (ORTHOPEDIC SUPPLIES) ×1
STIMULATOR BONE GROWTH EMG EXT (ORTHOPEDIC SUPPLIES) ×1 IMPLANT
STOCKINETTE M/LG 89821 (MISCELLANEOUS) ×2 IMPLANT
STRIP CLOSURE SKIN 1/4X4 (GAUZE/BANDAGES/DRESSINGS) ×2 IMPLANT
SUCTION FRAZIER HANDLE 10FR (MISCELLANEOUS) ×2
SUCTION TUBE FRAZIER 10FR DISP (MISCELLANEOUS) ×2 IMPLANT
SUT ETHILON 3-0 FS-10 30 BLK (SUTURE) ×2
SUT ETHILON 4-0 (SUTURE) ×1
SUT ETHILON 4-0 FS2 18XMFL BLK (SUTURE) ×1
SUT VIC AB 3-0 SH 27 (SUTURE) ×3
SUT VIC AB 3-0 SH 27X BRD (SUTURE) ×3 IMPLANT
SUT VIC AB 4-0 FS2 27 (SUTURE) ×2 IMPLANT
SUTURE EHLN 3-0 FS-10 30 BLK (SUTURE) ×1 IMPLANT
SUTURE ETHLN 4-0 FS2 18XMF BLK (SUTURE) ×1 IMPLANT
SYR 10ML LL (SYRINGE) ×4 IMPLANT
TOWEL OR 17X26 4PK STRL BLUE (TOWEL DISPOSABLE) ×2 IMPLANT
TRAY FOLEY SLVR 16FR LF STAT (SET/KITS/TRAYS/PACK) ×2 IMPLANT
WIRE OLIVE SMOOTH 1.4MMX60MM (WIRE) ×8 IMPLANT
WIRE Z .062 C-WIRE SPADE TIP (WIRE) IMPLANT

## 2021-04-30 NOTE — Op Note (Signed)
Operative note   Surgeon:Dakayla Disanti Lawyer: None    Preop diagnosis: Charcot midfoot fracture right foot    Postop diagnosis: Same    Procedure: 1.  First metatarsocuneiform arthrodesis 2.  Second metatarsal cuneiform arthrodesis 3.  Intercuneiform arthrodesis 4.  Medial cuneiform to navicular arthrodesis 5.  Placement of external bone stimulator all right foot    EBL: Minimal    Anesthesia:local and general    Hemostasis: Epinephrine infiltrated along the incision site.  The tourniquet was inflated to 250 mmHg for approximately 20 minutes but then deflated through the procedure.    Specimen: None    Complications: None    Operative indications:Tamara Villegas is an 75 y.o. that presents today for surgical intervention.  The risks/benefits/alternatives/complications have been discussed and consent has been given.    Procedure:  Patient was brought into the OR and placed on the operating table in thesupine position. After anesthesia was obtained theright lower extremity was prepped and draped in usual sterile fashion.  Attention was directed to the right foot where the previous incisions were noted.  At this time a dorsal incision was made overlying the second and third met cuneiform joint region.  Dissection was taken down through large amounts of scar tissue down to the level of the second met cuneiform joint area.  The previous plate to the second cuneiform joint was noted to be loose and the proximal screws had backed out.  The plate and screws were then removed.  There was noted to be fragmentation of the middle cuneiform at this time.  Attention was then directed to the dorsal medial aspect of the first met cuneiform joint where longitudinal incision was performed.  Sharp and blunt dissection carried down to the first met cuneiforms joint region.  The anterior tibial tendon was noted and retracted throughout the entire procedure.  The first met cuneiform joint was noted to be  unstable.  The proximal screws in the previous plate were backed out.  All of the screws and plate were then removed.  At this time the joint preparation was performed with a curette and all nonviable tissue was removed with both a curette and rongeur down to healthier bleeding bone.  This was to the first met cuneiform intercuneiform second met cuneiform region.  Also at the navicular middle cuneiform region.  Wounds were flushed with copious amounts of irrigation.  All joint sites were prepared next with Trinity bone graft.  Next a medial column plate was placed against the medial aspect of the first metatarsal.  A 3.5 millimeter screw locking screw was placed.  This was from the [facility] foot set.  Next a large 4.2 millimeter screw was placed from the medial cuneiform crossing the middle cuneiform to the lateral cuneiform this was through the Paragon plate.  The joint had been compressed just prior to placement of the screw with a large tenaculum.  A second 4.2 millimeter screw was placed through the plate in the medial cuneiform into the lateral cuneiform region.  Good stability was noted.  Two 3.5 screws were placed in the metatarsal medially.  Attention was then redirected to the dorsal second met cuneiform area where the joint preparation had occurred.  There was noted to be comminution of the dorsal one third of the cuneiform.  At this time the decision was made to include the navicular and this stabilization.  A stabilizing Paragon plate was placed from the navicular over the middle cuneiform to the second  metatarsal.  These were stabilized with 3.5 millimeter screws.  Attention redirected back to the medial column.  The plantar arm of the medial column fusion plate was then filled with a 3.5 nonlocking screw from the first metatarsal crossing through the midfoot region.  A final 4.2 mm nonlocking screw using an AO technique was placed from the medial aspect of the cuneiform into the midfoot.  All areas were  noted to be extremely stable at this time.  Final flush was performed.  Final packing of all joint spaces was performed with Trinity bone grafting.  At this time layered skin closure was performed with a 3-0 Vicryl for the deeper and subcutaneous tissue.  3-0 nylon was used for the dorsal incision and 4-0 Monocryl was used for the dorsomedial incision.  Patient was placed in a bulky sterile dressing.  I then placed her in a posterior splint for stabilization.  An external bone stimulator was then placed overlying the dorsal midfoot.  Family will be instructed on proper use of this at home.    Patient tolerated the procedure and anesthesia well.  Was transported from the OR to the PACU with all vital signs stable and vascular status intact. To be discharged per routine protocol.  Will follow up in approximately 1 week in the outpatient clinic.

## 2021-04-30 NOTE — Anesthesia Preprocedure Evaluation (Signed)
Anesthesia Evaluation  Patient identified by MRN, date of birth, ID band Patient awake    Reviewed: Allergy & Precautions, NPO status , Patient's Chart, lab work & pertinent test results  History of Anesthesia Complications Negative for: history of anesthetic complications  Airway Mallampati: III  TM Distance: >3 FB Neck ROM: Full    Dental  (+) Upper Dentures, Partial Lower   Pulmonary neg sleep apnea, neg COPD, former smoker,    breath sounds clear to auscultation- rhonchi (-) wheezing      Cardiovascular Exercise Tolerance: Good hypertension, Pt. on medications + CAD and + Cardiac Stents  (-) Past MI and (-) CABG  Rhythm:Regular Rate:Normal - Systolic murmurs and - Diastolic murmurs    Neuro/Psych neg Seizures negative neurological ROS  negative psych ROS   GI/Hepatic negative GI ROS, Neg liver ROS,   Endo/Other  diabetes  Renal/GU Renal InsufficiencyRenal disease     Musculoskeletal  (+) Arthritis ,   Abdominal (+) + obese,   Peds  Hematology negative hematology ROS (+)   Anesthesia Other Findings Past Medical History: 07/04/2014: Adenomatous polyp of colon 12/08/2020: Aortic stenosis, mild     Comment:  Mean gradient 9.9 mmHg No date: Carotid atherosclerosis, bilateral No date: CKD (chronic kidney disease), stage III (HCC) No date: Coronary artery disease No date: DOE (dyspnea on exertion) No date: Glaucoma 1993: Hepatitis B 12/14/2016: Herpes zoster infection of thoracic region     Comment:  left 06/12/2019: History of coronary angioplasty with insertion of stent     Comment:  DES x 1 to mLAD No date: History of kidney stones No date: Hypercholesteremia No date: Hypertension No date: Neuropathy     Comment:  feet No date: Nonrheumatic aortic valve insufficiency No date: Osteoarthritis of left foot No date: T2DM (type 2 diabetes mellitus) (HCC)     Comment:  diet control No date: Vitamin D  deficiency No date: Wears dentures     Comment:  full upper, partial lower   Reproductive/Obstetrics                             Anesthesia Physical Anesthesia Plan  ASA: 3  Anesthesia Plan: General   Post-op Pain Management:    Induction: Intravenous  PONV Risk Score and Plan: 2 and Ondansetron and Dexamethasone  Airway Management Planned: Oral ETT  Additional Equipment:   Intra-op Plan:   Post-operative Plan: Extubation in OR  Informed Consent: I have reviewed the patients History and Physical, chart, labs and discussed the procedure including the risks, benefits and alternatives for the proposed anesthesia with the patient or authorized representative who has indicated his/her understanding and acceptance.     Dental advisory given  Plan Discussed with: CRNA and Anesthesiologist  Anesthesia Plan Comments:         Anesthesia Quick Evaluation

## 2021-04-30 NOTE — Transfer of Care (Signed)
Immediate Anesthesia Transfer of Care Note  Patient: [name]  Procedure(s) Performed: ARTHRODESIS FOOT; LISFRANC; MULTIPLE (Right: Foot)  Patient Location: PACU  Anesthesia Type:General  Level of Consciousness: awake  Airway & Oxygen Therapy: Patient Spontanous Breathing and Patient connected to face mask oxygen  Post-op Assessment: Report given to RN and Post -op Vital signs reviewed and stable  Post vital signs: Reviewed  Last Vitals:  Vitals Value Taken Time  BP 143/71 04/30/21 1537  Temp    Pulse 80 04/30/21 1538  Resp 15 04/30/21 1538  SpO2 100 % 04/30/21 1538  Vitals shown include unvalidated device data.  Last Pain:  Vitals:   04/30/21 1008  TempSrc: Temporal  PainSc: 2          Complications: No notable events documented.

## 2021-04-30 NOTE — Anesthesia Postprocedure Evaluation (Signed)
Anesthesia Post Note  Patient: [place]  Procedure(s) Performed: ARTHRODESIS FOOT; LISFRANC; MULTIPLE (Right: Foot)  Patient location during evaluation: PACU Anesthesia Type: General Level of consciousness: awake and alert Pain management: pain level controlled Vital Signs Assessment: post-procedure vital signs reviewed and stable Respiratory status: spontaneous breathing, nonlabored ventilation and respiratory function stable Cardiovascular status: blood pressure returned to baseline and stable Postop Assessment: no apparent nausea or vomiting Anesthetic complications: no   No notable events documented.   Last Vitals:  Vitals:   04/30/21 1630 04/30/21 1634  BP: 114/61 133/67  Pulse: 79 99  Resp: 11 20  Temp:  36.4 C  SpO2: 96% 97%    Last Pain:  Vitals:   04/30/21 1634  TempSrc: Temporal  PainSc: 0-No pain                 [place] Ouch

## 2021-04-30 NOTE — H&P (Signed)
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  The various methods of treatment have been discussed with the patient.  No guarantees were given.  After consideration of risks, benefits and other options for treatment, the patient has consented to surgery.  I have reviewed the patients' chart and labs.     Villegas history and physical examination was performed in my office.  The patient was reexamined.  There have been no changes to this history and physical examination.  Tamara Villegas

## 2021-04-30 NOTE — Discharge Instructions (Addendum)
[place]  POST OPERATIVE INSTRUCTIONS FOR DR. Vickki Muff AND DR. Washington   Take your medication as prescribed.  Pain medication should be taken only as needed.  Keep the dressing clean, dry and intact.  Keep your foot elevated above the heart level for the first 48 hours.  Walking to the bathroom and brief periods of walking are acceptable, unless we have instructed you to be non-weight bearing.  Always wear your post-op shoe when walking.  Always use your crutches if you are to be non-weight bearing.  Do not take a shower. Baths are permissible as long as the foot is kept out of the water.   Every hour you are awake:  Bend your knee 15 times.   Call [facility] [phone]) if any of the following problems occur: You develop a temperature or fever. The bandage becomes saturated with blood. Medication does not stop your pain. Injury of the foot occurs. Any symptoms of infection including redness, odor, or red streaks running from wound.    AMBULATORY SURGERY  DISCHARGE INSTRUCTIONS   The drugs that you were given will stay in your system until tomorrow so for the next 24 hours you should not:  Drive an automobile Make any legal decisions Drink any alcoholic beverage   You may resume regular meals tomorrow.  Today it is better to start with liquids and gradually work up to solid foods.  You may eat anything you prefer, but it is better to start with liquids, then soup and crackers, and gradually work up to solid foods.   Please notify your doctor immediately if you have any unusual bleeding, trouble breathing, redness and pain at the surgery site, drainage, fever, or pain not relieved by medication.    Additional Instructions:        Please contact your physician with any problems or Same Day Surgery at [phone], Monday through Friday 6 am to 4 pm, or [place] at [facility] number at [phone].

## 2021-04-30 NOTE — Anesthesia Procedure Notes (Signed)
Procedure Name: Intubation Date/Time: 04/30/2021 11:25 AM Performed by: Lia Foyer, CRNA Pre-anesthesia Checklist: Patient identified, Emergency Drugs available, Suction available and Patient being monitored Patient Re-evaluated:Patient Re-evaluated prior to induction Oxygen Delivery Method: Circle system utilized Preoxygenation: Pre-oxygenation with 100% oxygen Induction Type: IV induction Ventilation: Mask ventilation without difficulty Laryngoscope Size: McGraph and 3 Grade View: Grade I Tube type: Oral Number of attempts: 1 Airway Equipment and Method: Stylet and Video-laryngoscopy Placement Confirmation: ETT inserted through vocal cords under direct vision, positive ETCO2 and breath sounds checked- equal and bilateral Secured at: 21 cm Tube secured with: Tape Dental Injury: Teeth and Oropharynx as per pre-operative assessment

## 2021-05-03 ENCOUNTER — Encounter: Payer: Self-pay | Admitting: Podiatry

## 2021-10-28 ENCOUNTER — Other Ambulatory Visit: Payer: Self-pay | Admitting: Gerontology

## 2021-10-28 DIAGNOSIS — Z1231 Encounter for screening mammogram for malignant neoplasm of breast: Secondary | ICD-10-CM

## 2021-12-05 IMAGING — MG DIGITAL SCREENING BILAT W/ TOMO W/ CAD
6 of 10 series · 6 of 30 positions shown · non-contrast
Comparison: Previous exam(s).

CLINICAL DATA: Screening.

EXAM:
DIGITAL SCREENING BILATERAL MAMMOGRAM WITH TOMO AND CAD

[R MLO synth-2D (1 of 2)]
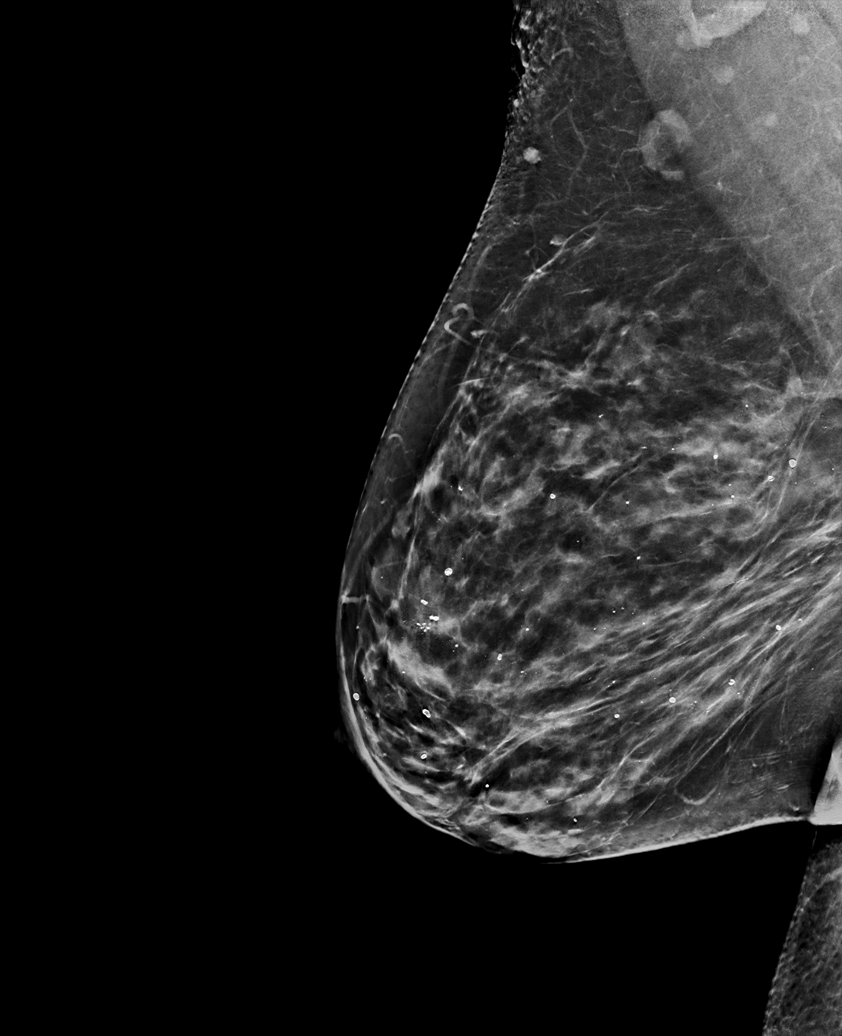

[L CC synth-2D]
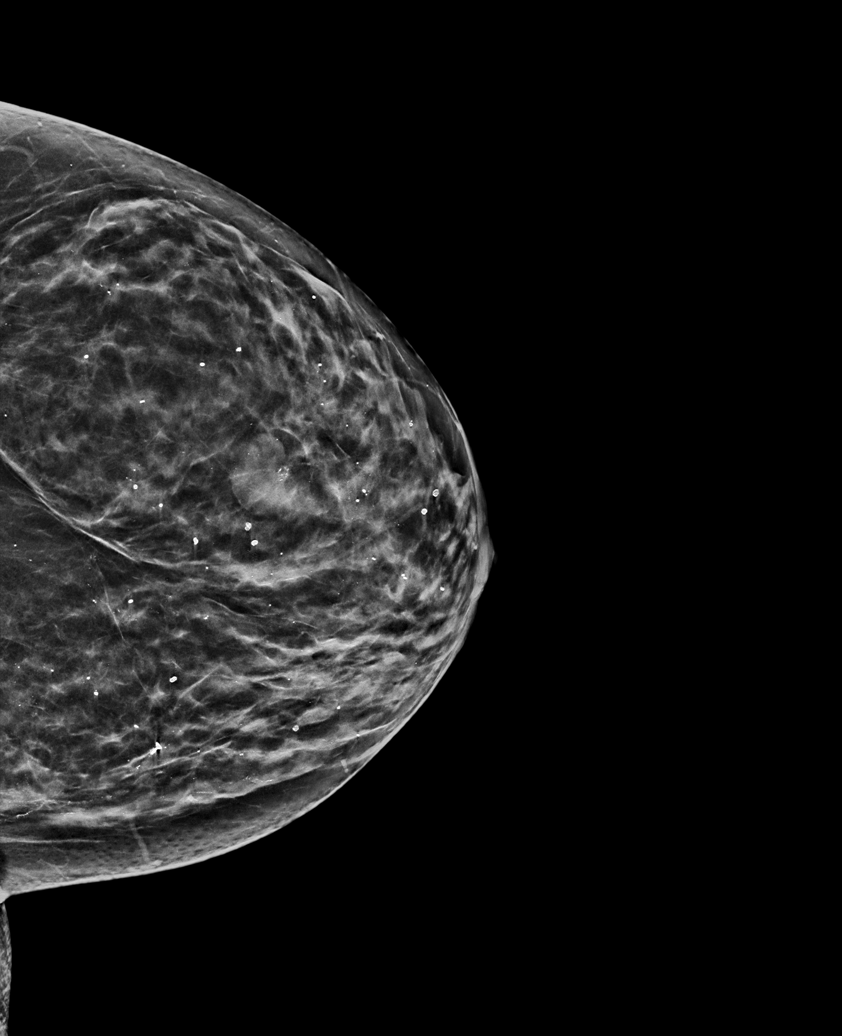

[R CC synth-2D]
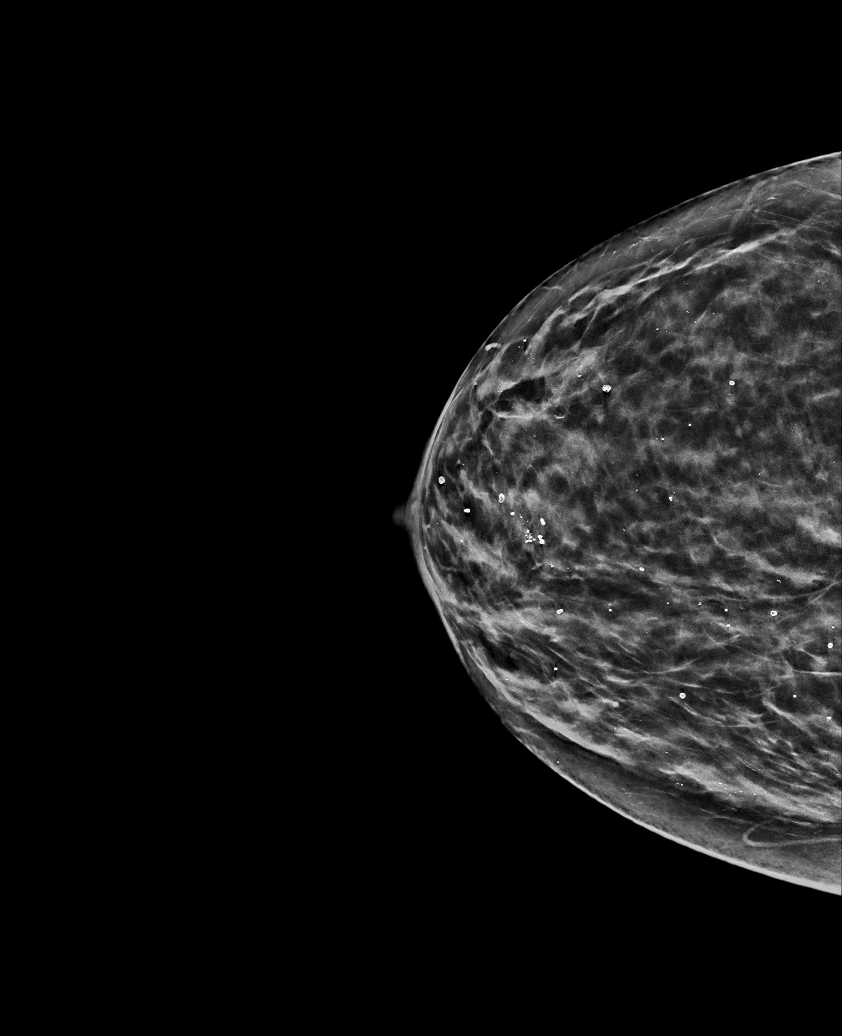

[L MLO synth-2D]
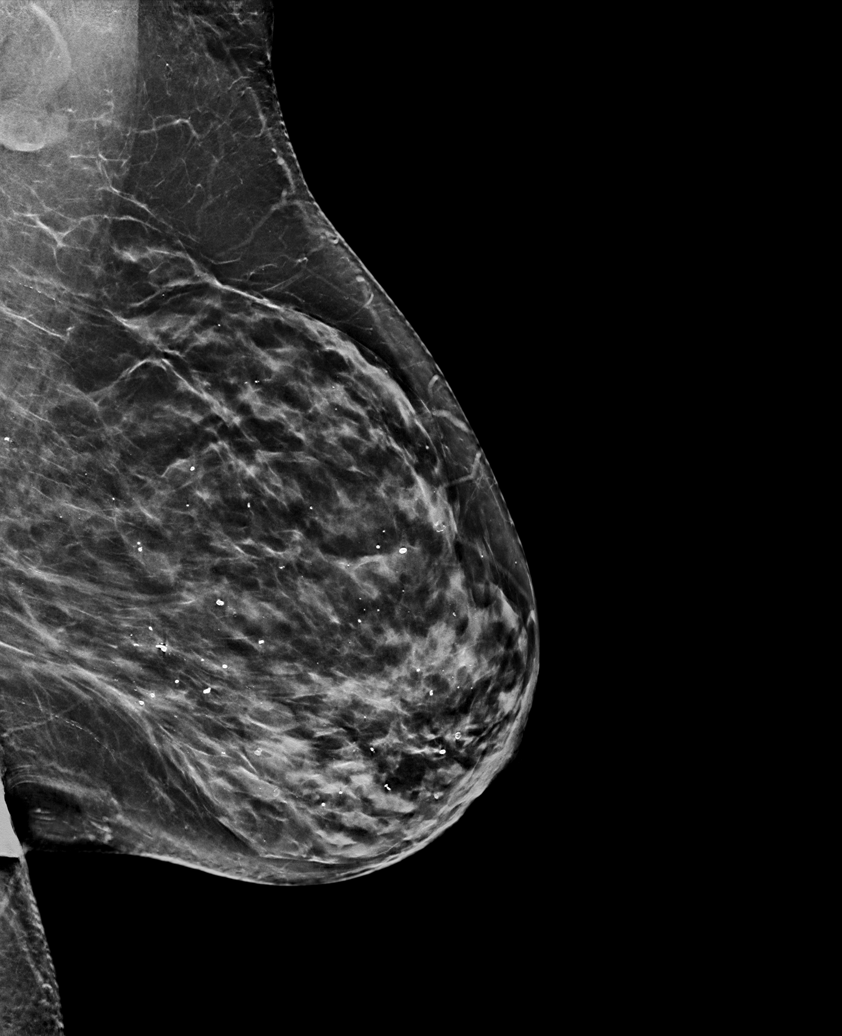

[R MLO synth-2D (2 of 2)]
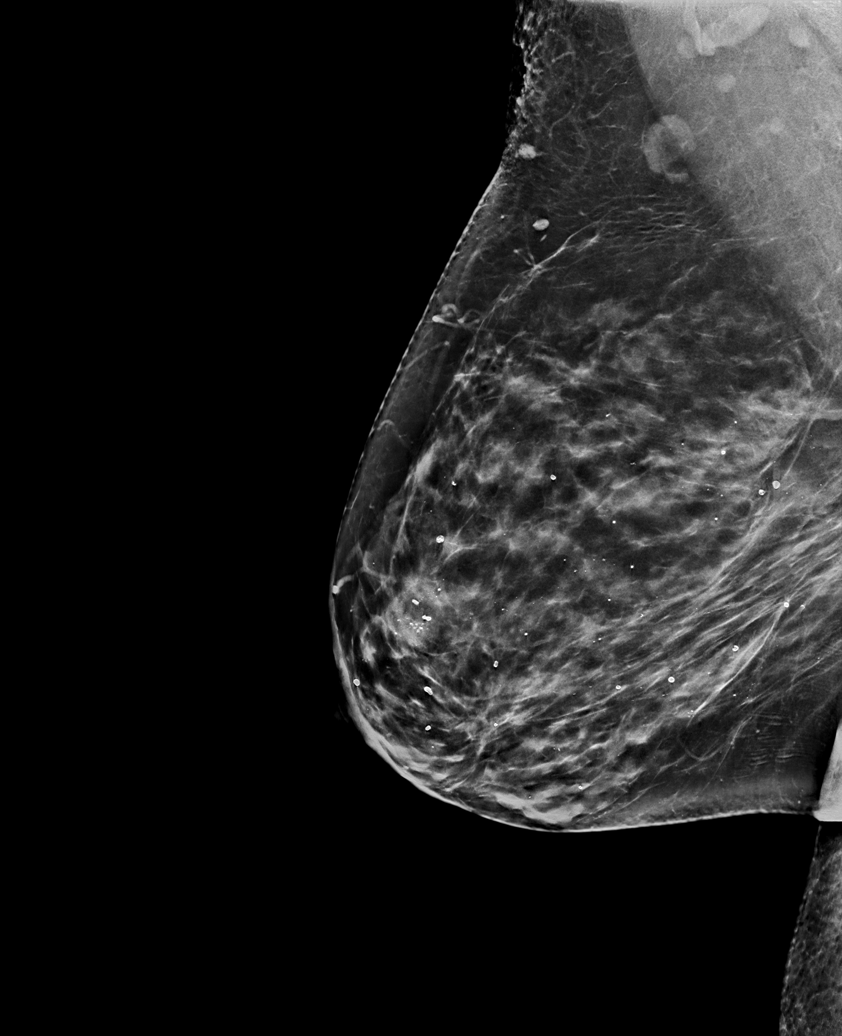

[L MLO tomo · tomo slice 37/74.0]
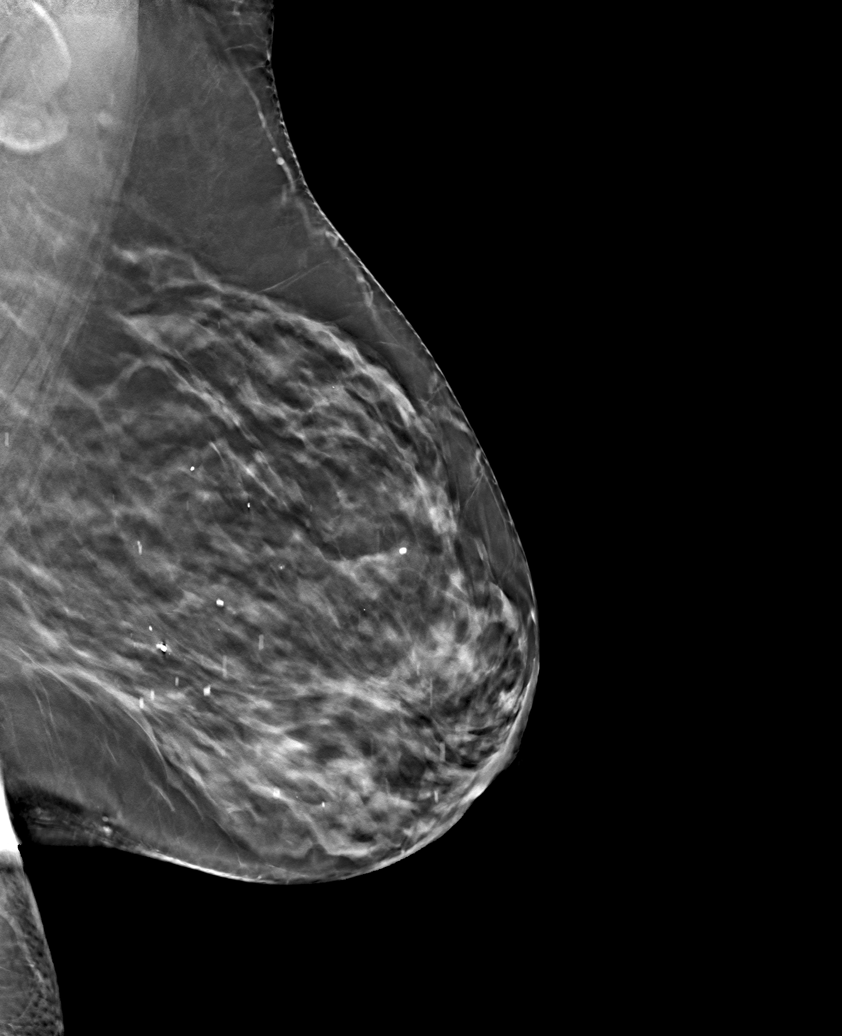

[6 of 30 positions shown; findings below may reference images not displayed]

ACR Breast Density Category c: The breast tissue is heterogeneously
dense, which may obscure small masses.
FINDINGS: There are no findings suspicious for malignancy. Images were
processed with CAD.
IMPRESSION: No mammographic evidence of malignancy. A result letter of this
screening mammogram will be mailed directly to the patient.

RECOMMENDATION:
Screening mammogram in one year. (Code:[id number])

BI-RADS CATEGORY  1: Negative.

## 2021-12-21 ENCOUNTER — Ambulatory Visit
Admission: RE | Admit: 2021-12-21 | Discharge: 2021-12-21 | Disposition: A | Discharge status: Home / Self Care | Payer: BC Managed Care – PPO | Source: Ambulatory Visit | Attending: Gerontology | Admitting: Gerontology

## 2021-12-21 DIAGNOSIS — Z1231 Encounter for screening mammogram for malignant neoplasm of breast: Secondary | ICD-10-CM | POA: Diagnosis present

## 2022-02-25 ENCOUNTER — Emergency Department
Admission: EM | Admit: 2022-02-25 | Discharge: 2022-02-25 | Disposition: A | Discharge status: Home / Self Care | Payer: BC Managed Care – PPO | Attending: Emergency Medicine | Admitting: Emergency Medicine

## 2022-02-25 ENCOUNTER — Encounter: Payer: Self-pay | Admitting: Emergency Medicine

## 2022-02-25 ENCOUNTER — Other Ambulatory Visit: Payer: Self-pay

## 2022-02-25 DIAGNOSIS — N750 Cyst of Bartholin's gland: Secondary | ICD-10-CM | POA: Diagnosis present

## 2022-02-25 MED ORDER — DOXYCYCLINE MONOHYDRATE 100 MG PO TABS: 100.0000 mg | ORAL_TABLET | Freq: Two times a day (BID) | ORAL | 0 refills | Status: AC

## 2022-02-25 MED ORDER — OXYCODONE-ACETAMINOPHEN 5-325 MG PO TABS: 1.0000 | ORAL_TABLET | Freq: Four times a day (QID) | ORAL | 0 refills | Status: AC | PRN

## 2022-02-25 MED ORDER — CEFIXIME 400 MG PO CAPS: 400.0000 mg | ORAL_CAPSULE | Freq: Four times a day (QID) | ORAL | 0 refills | Status: AC

## 2022-02-25 MED ORDER — ONDANSETRON 4 MG PO TBDP: 4.0000 mg | ORAL_TABLET | Freq: Three times a day (TID) | ORAL | 0 refills | Status: AC | PRN

## 2022-02-25 MED ORDER — FENTANYL CITRATE PF 50 MCG/ML IJ SOSY
50.0000 ug | PREFILLED_SYRINGE | Freq: Once | INTRAMUSCULAR | Status: AC
  Administered 2022-02-25: 50 ug via INTRAMUSCULAR
  Filled 2022-02-25: qty 1

## 2022-02-25 MED ORDER — LIDOCAINE HCL 1 % IJ SOLN
5.0000 mL | Freq: Once | INTRAMUSCULAR | Status: AC
  Administered 2022-02-25: 5 mL
  Filled 2022-02-25: qty 10

## 2022-02-25 NOTE — Discharge Instructions (Signed)
Take cefixime 4 times daily for the next 7 days and doxycycline twice daily for the next 7 days. Please make an appointment with Dr. Leafy Ro to have drain assessed and possibly removed in a week. You can take Percocet for pain. If you develop worsening pain, fever or other new symptoms, please return to the emergency department.

## 2022-02-25 NOTE — ED Triage Notes (Signed)
Pt here with a cyst on her vagina. Pt states she had a UTI last week. Pt states cyst is very painful.

## 2022-02-25 NOTE — ED Provider Notes (Signed)
[facility] Provider Note  Patient Contact: 3:45 PM (approximate)   History   Cyst   HPI  Tamara Villegas is a 76 y.o. female presents to the emergency department with a large right-sided Bartholin cyst.  Patient states that she has had a small sebaceous cyst in the affected area for many years but states over the past 2 to 3 days, affected area has become swollen and painful and has been difficult for her to walk.  She denies any drainage from the affected area.      Physical Exam   Triage Vital Signs: ED Triage Vitals  Enc Vitals Group     BP 02/25/22 1448 (!) 144/77     Pulse Rate 02/25/22 1448 99     Resp 02/25/22 1448 16     Temp 02/25/22 1448 98.4 F (36.9 C)     Temp Source 02/25/22 1448 Oral     SpO2 02/25/22 1448 96 %     Weight 02/25/22 1448 190 lb 0.6 oz (86.2 kg)     Height 02/25/22 1448 5' 5.5" (1.664 m)     Head Circumference --      Peak Flow --      Pain Score 02/25/22 1447 9     Pain Loc --      Pain Edu? --      Excl. in [place]? --     Most recent vital signs: Vitals:   02/25/22 1448  BP: (!) 144/77  Pulse: 99  Resp: 16  Temp: 98.4 F (36.9 C)  SpO2: 96%     General: Alert and in no acute distress. Eyes:  PERRL. EOMI. Head: No acute traumatic findings ENT:      Nose: No congestion/rhinnorhea.      Mouth/Throat: Mucous membranes are moist. Neck: No stridor. No cervical spine tenderness to palpation. Cardiovascular:  Good peripheral perfusion Respiratory: Normal respiratory effort without tachypnea or retractions. Lungs CTAB. Good air entry to the bases with no decreased or absent breath sounds. Gastrointestinal: Bowel sounds 4 quadrants. Soft and nontender to palpation. No guarding or rigidity. No palpable masses. No distention. No CVA tenderness. Genitourinary: Patient has large right Bartholin cyst. Musculoskeletal: Full range of motion to all extremities.  Neurologic:  No gross focal neurologic deficits are  appreciated.     ED Results / Procedures / Treatments   Labs (all labs ordered are listed, but only abnormal results are displayed) Labs Reviewed - No data to display       PROCEDURES:  Critical Care performed: No  ..Incision and Drainage  Date/Time: 02/25/2022 5:24 PM  Performed by: Lannie Fields, PA-C Authorized by: Lannie Fields, PA-C   Consent:    Consent obtained:  Verbal   Risks discussed:  Bleeding and incomplete drainage Universal protocol:    Procedure explained and questions answered to patient or proxy's satisfaction: yes     Patient identity confirmed:  Verbally with patient Location:    Type:  Abscess   Location:  Anogenital   Anogenital location:  Bartholin's gland Pre-procedure details:    Skin preparation:  Povidone-iodine Sedation:    Sedation type:  None Anesthesia:    Anesthesia method:  Local infiltration   Local anesthetic:  Lidocaine 1% w/o epi Procedure type:    Complexity:  Complex Procedure details:    Incision types:  Stab incision   Wound management:  Probed and deloculated   Drainage:  Purulent   Drainage amount:  Copious  Wound treatment:  Drain placed   Packing materials:  Word catheter Post-procedure details:    Procedure completion:  Tolerated well, no immediate complications    MEDICATIONS ORDERED IN ED: Medications  lidocaine (XYLOCAINE) 1 % (with pres) injection 5 mL (has no administration in time range)  fentaNYL (SUBLIMAZE) injection 50 mcg (50 mcg Intramuscular Given 02/25/22 1607)     IMPRESSION / MDM / ASSESSMENT AND PLAN / ED COURSE  I reviewed the triage vital signs and the nursing notes.                              Assessment and plan: Bartholin cyst:  76 year old female presents to the emergency department with a large right-sided Bartholin cyst that been present for the past 3 days. Vital signs are reassuring at triage.  Patient was alert, active and nontoxic-appearing.  She underwent incision and  drainage and a Word catheter was placed without complication.  I gave patient a referral to gynecology to be seen in 1 week.  She was discharged with cefixime and doxycycline.   FINAL CLINICAL IMPRESSION(S) / ED DIAGNOSES   Final diagnoses:  Bartholin cyst     Rx / DC Orders   ED Discharge Orders          Ordered    cefixime (SUPRAX) 400 MG CAPS capsule  4 times daily        02/25/22 1702    doxycycline (ADOXA) 100 MG tablet  2 times daily        02/25/22 1702    oxyCODONE-acetaminophen (PERCOCET/ROXICET) 5-325 MG tablet  Every 6 hours PRN        02/25/22 1702    ondansetron (ZOFRAN-ODT) 4 MG disintegrating tablet  Every 8 hours PRN        02/25/22 1702             Note:  This document was prepared using Dragon voice recognition software and may include unintentional dictation errors.   Vallarie Mare [place], PA-C 02/25/22 1725    Lucrezia Starch, MD 02/25/22 Drema Halon

## 2022-11-24 ENCOUNTER — Encounter: Payer: Self-pay | Admitting: *Deleted

## 2022-11-25 ENCOUNTER — Ambulatory Visit
Admission: RE | Admit: 2022-11-25 | Discharge: 2022-11-25 | Disposition: A | Discharge status: Home / Self Care | Payer: BC Managed Care – PPO | Attending: Gastroenterology | Admitting: Gastroenterology

## 2022-11-25 ENCOUNTER — Ambulatory Visit: Payer: BC Managed Care – PPO | Admitting: Certified Registered"

## 2022-11-25 ENCOUNTER — Encounter
Admission: RE | Disposition: A | Discharge status: Home / Self Care | Payer: Self-pay | Source: Home / Self Care | Attending: Gastroenterology

## 2022-11-25 DIAGNOSIS — I129 Hypertensive chronic kidney disease with stage 1 through stage 4 chronic kidney disease, or unspecified chronic kidney disease: Secondary | ICD-10-CM | POA: Diagnosis not present

## 2022-11-25 DIAGNOSIS — I2510 Atherosclerotic heart disease of native coronary artery without angina pectoris: Secondary | ICD-10-CM | POA: Insufficient documentation

## 2022-11-25 DIAGNOSIS — D120 Benign neoplasm of cecum: Secondary | ICD-10-CM | POA: Diagnosis not present

## 2022-11-25 DIAGNOSIS — Z90710 Acquired absence of both cervix and uterus: Secondary | ICD-10-CM | POA: Diagnosis not present

## 2022-11-25 DIAGNOSIS — Z955 Presence of coronary angioplasty implant and graft: Secondary | ICD-10-CM | POA: Insufficient documentation

## 2022-11-25 DIAGNOSIS — E669 Obesity, unspecified: Secondary | ICD-10-CM | POA: Diagnosis not present

## 2022-11-25 DIAGNOSIS — N183 Chronic kidney disease, stage 3 unspecified: Secondary | ICD-10-CM | POA: Diagnosis not present

## 2022-11-25 DIAGNOSIS — I350 Nonrheumatic aortic (valve) stenosis: Secondary | ICD-10-CM | POA: Diagnosis not present

## 2022-11-25 DIAGNOSIS — Z6830 Body mass index (BMI) 30.0-30.9, adult: Secondary | ICD-10-CM | POA: Diagnosis not present

## 2022-11-25 DIAGNOSIS — Z1211 Encounter for screening for malignant neoplasm of colon: Secondary | ICD-10-CM | POA: Insufficient documentation

## 2022-11-25 DIAGNOSIS — E1122 Type 2 diabetes mellitus with diabetic chronic kidney disease: Secondary | ICD-10-CM | POA: Diagnosis not present

## 2022-11-25 DIAGNOSIS — D123 Benign neoplasm of transverse colon: Secondary | ICD-10-CM | POA: Diagnosis not present

## 2022-11-25 DIAGNOSIS — K640 First degree hemorrhoids: Secondary | ICD-10-CM | POA: Insufficient documentation

## 2022-11-25 HISTORY — PX: COLONOSCOPY WITH PROPOFOL: SHX5780

## 2022-11-25 LAB — GLUCOSE, CAPILLARY: Glucose-Capillary: 91 mg/dL (ref 70–99)

## 2022-11-25 SURGERY — COLONOSCOPY WITH PROPOFOL
Anesthesia: General

## 2022-11-25 MED ORDER — PROPOFOL 1000 MG/100ML IV EMUL
INTRAVENOUS | Status: AC
  Filled 2022-11-25: qty 100

## 2022-11-25 MED ORDER — LIDOCAINE HCL (PF) 2 % IJ SOLN
INTRAMUSCULAR | Status: AC
  Filled 2022-11-25: qty 5

## 2022-11-25 MED ORDER — PROPOFOL 500 MG/50ML IV EMUL
INTRAVENOUS | Status: DC | PRN
  Administered 2022-11-25: 100 ug/kg/min via INTRAVENOUS

## 2022-11-25 MED ORDER — SODIUM CHLORIDE 0.9 % IV SOLN: INTRAVENOUS | Status: DC

## 2022-11-25 MED ORDER — LIDOCAINE HCL (CARDIAC) PF 100 MG/5ML IV SOSY
PREFILLED_SYRINGE | INTRAVENOUS | Status: DC | PRN
  Administered 2022-11-25: 50 mg via INTRAVENOUS

## 2022-11-25 MED ORDER — PROPOFOL 10 MG/ML IV BOLUS
INTRAVENOUS | Status: DC | PRN
  Administered 2022-11-25: 90 mg via INTRAVENOUS

## 2022-11-25 NOTE — H&P (Signed)
Outpatient short stay form Pre-procedure 11/25/2022  Lesly Rubenstein, MD  Primary Physician: [facility]  Reason for visit:  Surveillance colonoscopy  History of present illness:    77 y/o lady with history of obesity, DM II, and hypertension here for surveillance colonoscopy. Last colonoscopy in 2015 with small cecal TA. No blood thinners. No family history of GI malignancies. History of hysterectomy.    Current Facility-Administered Medications:    0.9 %  sodium chloride infusion, , Intravenous, Continuous, Srinivas Lippman, Hilton Cork, MD, Last Rate: 20 mL/hr at 11/25/22 1222, New Bag at 11/25/22 1222  Medications Prior to Admission  Medication Sig Dispense Refill Last Dose   dapagliflozin propanediol (FARXIGA) 5 MG TABS tablet Take by mouth daily.   11/24/2022   glipiZIDE (GLUCOTROL XL) 2.5 MG 24 hr tablet Take 2.5 mg by mouth daily with breakfast.   11/24/2022   lisinopril (ZESTRIL) 20 MG tablet Take 20 mg by mouth daily.   11/25/2022   acetaminophen (TYLENOL) 500 MG tablet Take 1,000 mg by mouth every 6 (six) hours as needed for moderate pain or headache.      aspirin 81 MG tablet Take 81 mg by mouth daily.      atorvastatin (LIPITOR) 80 MG tablet Take 80 mg by mouth at bedtime.      Calcium Carb-Cholecalciferol (CALCIUM 600 + D PO) Take 1 tablet by mouth 2 (two) times daily.      Cholecalciferol (VITAMIN D) 50 MCG (2000 UT) CAPS Take 2,000 Units by mouth daily.      DULoxetine (CYMBALTA) 30 MG capsule Take 30 mg by mouth daily.      ezetimibe (ZETIA) 10 MG tablet Take 10 mg by mouth daily.      latanoprost (XALATAN) 0.005 % ophthalmic solution Place 1 drop into both eyes at bedtime.      Multiple Vitamin (MULTIVITAMIN) tablet Take 1 tablet by mouth daily.      Omega-3 Fatty Acids (FISH OIL) 1200 MG CAPS Take 1,200-2,400 mg by mouth See admin instructions. Take 1200 mg by mouth in the morning and 2400 mg at night      timolol (TIMOPTIC) 0.5 % ophthalmic solution Place 1 drop into  both eyes in the morning.        Allergies  Allergen Reactions   Sudafed [Pseudoephedrine] Other (See Comments)    "Hyper"     Past Medical History:  Diagnosis Date   Adenomatous polyp of colon 07/04/2014   Aortic stenosis, mild 12/08/2020   Mean gradient 9.9 mmHg   Carotid atherosclerosis, bilateral    CKD (chronic kidney disease), stage III (HCC)    Coronary artery disease    DOE (dyspnea on exertion)    Glaucoma    Hepatitis B 1993   Herpes zoster infection of thoracic region 12/14/2016   left   History of coronary angioplasty with insertion of stent 06/12/2019   DES x 1 to mLAD   History of kidney stones    Hypercholesteremia    Hypertension    Neuropathy    feet   Nonrheumatic aortic valve insufficiency    Osteoarthritis of left foot    T2DM (type 2 diabetes mellitus) ([place])    diet control   Vitamin D deficiency    Wears dentures    full upper, partial lower    Review of systems:  Otherwise negative.    Physical Exam  Gen: Alert, oriented. Appears stated age.  HEENT: PERRLA. Lungs: No respiratory distress CV: RRR Abd: soft, benign,  no masses Ext: No edema    Planned procedures: Proceed with colonoscopy. The patient understands the nature of the planned procedure, indications, risks, alternatives and potential complications including but not limited to bleeding, infection, perforation, damage to internal organs and possible oversedation/side effects from anesthesia. The patient agrees and gives consent to proceed.  Please refer to procedure notes for findings, recommendations and patient disposition/instructions.     Lesly Rubenstein, MD [facility] G[facility]ology

## 2022-11-25 NOTE — Op Note (Signed)
[facility] Gastroenterology Patient Name: Tamara Villegas Procedure Date: 11/25/2022 12:47 PM MRN: [id number] Account #: [id number] Date of Birth: 11-26-1945 Admit Type: Outpatient Age: 77 Room: [facility] ENDO ROOM 3 Gender: Female Note Status: Finalized Instrument Name: Jasper Riling [id number] Procedure:             Colonoscopy Indications:           Surveillance: Personal history of adenomatous polyps                         on last colonoscopy > 5 years ago Providers:             Andrey Farmer MD, MD Referring MD:          Andrey Farmer MD, MD (Referring MD), No Local Md,                         MD (Referring MD) Medicines:             Monitored Anesthesia Care Complications:         No immediate complications. Estimated blood loss:                         Minimal. Procedure:             Pre-Anesthesia Assessment:                        - Prior to the procedure, a History and Physical was                         performed, and patient medications and allergies were                         reviewed. The patient is competent. The risks and                         benefits of the procedure and the sedation options and                         risks were discussed with the patient. All questions                         were answered and informed consent was obtained.                         Patient identification and proposed procedure were                         verified by the physician, the nurse, the                         anesthesiologist, the anesthetist and the technician                         in the endoscopy suite. Mental Status Examination:                         alert and oriented. Airway Examination: normal  oropharyngeal airway and neck mobility. Respiratory                         Examination: clear to auscultation. CV Examination:                         normal. Prophylactic Antibiotics: The patient does not                          require prophylactic antibiotics. Prior                         Anticoagulants: The patient has taken no anticoagulant                         or antiplatelet agents. ASA Grade Assessment: II - A                         patient with mild systemic disease. After reviewing                         the risks and benefits, the patient was deemed in                         satisfactory condition to undergo the procedure. The                         anesthesia plan was to use monitored anesthesia care                         (MAC). Immediately prior to administration of                         medications, the patient was re-assessed for adequacy                         to receive sedatives. The heart rate, respiratory                         rate, oxygen saturations, blood pressure, adequacy of                         pulmonary ventilation, and response to care were                         monitored throughout the procedure. The physical                         status of the patient was re-assessed after the                         procedure.                        After obtaining informed consent, the colonoscope was                         passed under direct vision. Throughout the procedure,  the patient's blood pressure, pulse, and oxygen                         saturations were monitored continuously. The                         Colonoscope was introduced through the anus and                         advanced to the the cecum, identified by appendiceal                         orifice and ileocecal valve. The colonoscopy was                         performed without difficulty. The patient tolerated                         the procedure well. The quality of the bowel                         preparation was good. The ileocecal valve, appendiceal                         orifice, and rectum were photographed. Findings:      The perianal and digital rectal examinations  were normal.      A 2 mm polyp was found in the cecum. The polyp was sessile. The polyp       was removed with a cold snare. Resection and retrieval were complete.       Estimated blood loss was minimal.      A 6 mm polyp was found in the proximal transverse colon. The polyp was       sessile. The polyp was removed with a cold snare. Resection and       retrieval were complete. Estimated blood loss was minimal.      Two sessile polyps were found in the transverse colon. The polyps were 1       to 2 mm in size. These polyps were removed with a jumbo cold forceps.       Resection and retrieval were complete. Estimated blood loss was minimal.      Internal hemorrhoids were found during retroflexion. The hemorrhoids       were Grade I (internal hemorrhoids that do not prolapse).      The exam was otherwise without abnormality on direct and retroflexion       views. Impression:            - One 2 mm polyp in the cecum, removed with a cold                         snare. Resected and retrieved.                        - One 6 mm polyp in the proximal transverse colon,                         removed with a cold snare. Resected and retrieved.                        -  Two 1 to 2 mm polyps in the transverse colon,                         removed with a jumbo cold forceps. Resected and                         retrieved.                        - Internal hemorrhoids.                        - The examination was otherwise normal on direct and                         retroflexion views. Recommendation:        - Discharge patient to home.                        - Resume previous diet.                        - Continue present medications.                        - Await pathology results.                        - Repeat colonoscopy in 3 years for surveillance.                        - Return to referring physician as previously                         scheduled. Procedure Code(s):     --- Professional  ---                        [phone], Colonoscopy, flexible; with removal of                         tumor(s), polyp(s), or other lesion(s) by snare                         technique                        45380, 56, Colonoscopy, flexible; with biopsy, single                         or multiple Diagnosis Code(s):     --- Professional ---                        Z86.010, Personal history of colonic polyps                        D12.0, Benign neoplasm of cecum                        D12.3, Benign neoplasm of transverse colon (hepatic  flexure or splenic flexure)                        K64.0, First degree hemorrhoids CPT copyright 2022 American [employer]. All rights reserved. The codes documented in this report are preliminary and upon coder review may  be revised to meet current compliance requirements. Andrey Farmer MD, MD 11/25/2022 1:20:30 PM Number of Addenda: 0 Note Initiated On: 11/25/2022 12:47 PM Scope Withdrawal Time: 0 hours 6 minutes 47 seconds  Total Procedure Duration: 0 hours 16 minutes 23 seconds  Estimated Blood Loss:  Estimated blood loss was minimal.      [facility]

## 2022-11-25 NOTE — Transfer of Care (Signed)
Immediate Anesthesia Transfer of Care Note  Patient: Tamara Villegas  Procedure(s) Performed: COLONOSCOPY WITH PROPOFOL  Patient Location: PACU  Anesthesia Type:MAC  Level of Consciousness: drowsy  Airway & Oxygen Therapy: Patient Spontanous Breathing  Post-op Assessment: Report given to RN and Post -op Vital signs reviewed and stable  Post vital signs: stable  Last Vitals:  Vitals Value Taken Time  BP    Temp    Pulse 94 11/25/22 1315  Resp 14 11/25/22 1315  SpO2 100 % 11/25/22 1315  Vitals shown include unvalidated device data.  Last Pain:  Vitals:   11/25/22 1219  TempSrc: Temporal  PainSc: 0-No pain         Complications: No notable events documented.

## 2022-11-25 NOTE — Anesthesia Preprocedure Evaluation (Signed)
Anesthesia Evaluation  Patient identified by MRN, date of birth, ID band Patient awake    Reviewed: Allergy & Precautions, NPO status , Patient's Chart, lab work & pertinent test results  History of Anesthesia Complications Negative for: history of anesthetic complications  Airway Mallampati: III  TM Distance: >3 FB Neck ROM: Full    Dental  (+) Upper Dentures, Partial Lower   Pulmonary neg sleep apnea, neg COPD, former smoker   breath sounds clear to auscultation- rhonchi (-) wheezing      Cardiovascular Exercise Tolerance: Good hypertension, Pt. on medications + CAD and + Cardiac Stents  (-) Past MI and (-) CABG + Valvular Problems/Murmurs AS  Rhythm:Regular Rate:Normal - Systolic murmurs and - Diastolic murmurs    Neuro/Psych neg Seizures negative neurological ROS  negative psych ROS   GI/Hepatic negative GI ROS,,,  Endo/Other  diabetes    Renal/GU Renal InsufficiencyRenal disease     Musculoskeletal  (+) Arthritis ,    Abdominal  (+) + obese  Peds  Hematology negative hematology ROS (+)   Anesthesia Other Findings Past Medical History: 07/04/2014: Adenomatous polyp of colon 12/08/2020: Aortic stenosis, mild     Comment:  Mean gradient 9.9 mmHg No date: Carotid atherosclerosis, bilateral No date: CKD (chronic kidney disease), stage III (HCC) No date: Coronary artery disease No date: DOE (dyspnea on exertion) No date: Glaucoma 1993: Hepatitis B 12/14/2016: Herpes zoster infection of thoracic region     Comment:  left 06/12/2019: History of coronary angioplasty with insertion of stent     Comment:  DES x 1 to mLAD No date: History of kidney stones No date: Hypercholesteremia No date: Hypertension No date: Neuropathy     Comment:  feet No date: Nonrheumatic aortic valve insufficiency No date: Osteoarthritis of left foot No date: T2DM (type 2 diabetes mellitus) (HCC)     Comment:  diet control No  date: Vitamin D deficiency No date: Wears dentures     Comment:  full upper, partial lower   Reproductive/Obstetrics                              Anesthesia Physical Anesthesia Plan  ASA: 3  Anesthesia Plan: General   Post-op Pain Management: Minimal or no pain anticipated   Induction: Intravenous  PONV Risk Score and Plan: 2 and Ondansetron and Dexamethasone  Airway Management Planned: Oral ETT  Additional Equipment: None  Intra-op Plan:   Post-operative Plan: Extubation in OR  Informed Consent: I have reviewed the patients History and Physical, chart, labs and discussed the procedure including the risks, benefits and alternatives for the proposed anesthesia with the patient or authorized representative who has indicated his/her understanding and acceptance.     Dental advisory given  Plan Discussed with: CRNA and Anesthesiologist  Anesthesia Plan Comments: (Discussed risks of anesthesia with patient, including possibility of difficulty with spontaneous ventilation under anesthesia necessitating airway intervention, PONV, and rare risks such as cardiac or respiratory or neurological events, and allergic reactions. Discussed the role of CRNA in patient's perioperative care. Patient understands.)         Anesthesia Quick Evaluation

## 2022-11-25 NOTE — Anesthesia Postprocedure Evaluation (Signed)
Anesthesia Post Note  Patient: [place]  Procedure(s) Performed: COLONOSCOPY WITH PROPOFOL  Patient location during evaluation: Endoscopy Anesthesia Type: General Level of consciousness: awake and alert Pain management: pain level controlled Vital Signs Assessment: post-procedure vital signs reviewed and stable Respiratory status: spontaneous breathing, nonlabored ventilation, respiratory function stable and patient connected to nasal cannula oxygen Cardiovascular status: blood pressure returned to baseline and stable Postop Assessment: no apparent nausea or vomiting Anesthetic complications: no   No notable events documented.   Last Vitals:  Vitals:   11/25/22 1314 11/25/22 1324  BP: 134/65 (!) 145/68  Pulse: 94   Resp: 16   Temp: 36.8 C   SpO2: 98%     Last Pain:  Vitals:   11/25/22 1324  TempSrc:   PainSc: 0-No pain                 Arita Miss

## 2022-11-25 NOTE — Interval H&P Note (Signed)
History and Physical Interval Note:  11/25/2022 12:31 PM  Tamara Villegas  has presented today for surgery, with the diagnosis of HX ADEN POLYPS.  The various methods of treatment have been discussed with the patient and family. After consideration of risks, benefits and other options for treatment, the patient has consented to  Procedure(s): COLONOSCOPY WITH PROPOFOL (N/A) as a surgical intervention.  The patient's history has been reviewed, patient examined, no change in status, stable for surgery.  I have reviewed the patient's chart and labs.  Questions were answered to the patient's satisfaction.     Lesly Rubenstein  Ok to proceed with colonoscopy

## 2022-11-28 ENCOUNTER — Encounter: Payer: Self-pay | Admitting: Gastroenterology

## 2022-11-28 LAB — SURGICAL PATHOLOGY

## 2023-05-09 ENCOUNTER — Other Ambulatory Visit: Payer: Self-pay | Admitting: Gerontology

## 2023-05-09 DIAGNOSIS — Z1231 Encounter for screening mammogram for malignant neoplasm of breast: Secondary | ICD-10-CM

## 2023-05-10 ENCOUNTER — Ambulatory Visit
Admission: RE | Admit: 2023-05-10 | Discharge: 2023-05-10 | Disposition: A | Discharge status: Home / Self Care | Payer: BC Managed Care – PPO | Source: Ambulatory Visit | Attending: Gerontology | Admitting: Gerontology

## 2023-05-10 DIAGNOSIS — Z1231 Encounter for screening mammogram for malignant neoplasm of breast: Secondary | ICD-10-CM | POA: Diagnosis present

## 2023-11-06 ENCOUNTER — Other Ambulatory Visit: Payer: Self-pay

## 2023-11-06 ENCOUNTER — Emergency Department
Admission: EM | Admit: 2023-11-06 | Discharge: 2023-11-06 | Disposition: A | Discharge status: Home / Self Care | Payer: BC Managed Care – PPO | Attending: Emergency Medicine | Admitting: Emergency Medicine

## 2023-11-06 DIAGNOSIS — N751 Abscess of Bartholin's gland: Secondary | ICD-10-CM | POA: Diagnosis present

## 2023-11-06 DIAGNOSIS — E1122 Type 2 diabetes mellitus with diabetic chronic kidney disease: Secondary | ICD-10-CM | POA: Diagnosis not present

## 2023-11-06 DIAGNOSIS — I129 Hypertensive chronic kidney disease with stage 1 through stage 4 chronic kidney disease, or unspecified chronic kidney disease: Secondary | ICD-10-CM | POA: Diagnosis not present

## 2023-11-06 DIAGNOSIS — N189 Chronic kidney disease, unspecified: Secondary | ICD-10-CM | POA: Insufficient documentation

## 2023-11-06 MED ORDER — CEFIXIME 400 MG PO CAPS: 400.0000 mg | ORAL_CAPSULE | Freq: Every day | ORAL | 0 refills | Status: AC

## 2023-11-06 MED ORDER — CLINDAMYCIN HCL 300 MG PO CAPS: 300.0000 mg | ORAL_CAPSULE | Freq: Four times a day (QID) | ORAL | 0 refills | Status: AC

## 2023-11-06 MED ORDER — LIDOCAINE HCL (PF) 1 % IJ SOLN
5.0000 mL | Freq: Once | INTRAMUSCULAR | Status: AC
  Administered 2023-11-06: 5 mL via INTRADERMAL
  Filled 2023-11-06: qty 5

## 2023-11-06 NOTE — ED Triage Notes (Signed)
 Pt presents with c/o of vaginal cyst, pt states HX of same. Pt states GYN office closed. Pt denies fevers or chills. NAD noted.

## 2023-11-06 NOTE — ED Provider Notes (Signed)
 [facility] Provider Note    Event Date/Time   First MD Initiated Contact with Patient 11/06/23 1143     (approximate)   History   Abscess   HPI  Tamara Villegas is a 78 y.o. female with PMH of hypertension, CAD, CKD, diabetes presents for evaluation of a Bartholin abscess.  Patient states she reached out to her gynecologist's office which was closed today.  She has had a Bartholin cyst before and feels that this is the same.  She describes swelling to the labia and pain when sitting.  No fevers or chills.  No drainage at this time.      Physical Exam   Triage Vital Signs: ED Triage Vitals  Encounter Vitals Group     BP 11/06/23 1116 124/78     Systolic BP Percentile --      Diastolic BP Percentile --      Pulse Rate 11/06/23 1116 (!) 101     Resp 11/06/23 1116 19     Temp 11/06/23 1116 97.6 F (36.4 C)     Temp Source 11/06/23 1116 Oral     SpO2 --      Weight 11/06/23 1201 184 lb 15.5 oz (83.9 kg)     Height 11/06/23 1201 5\' 5"  (1.651 m)     Head Circumference --      Peak Flow --      Pain Score 11/06/23 1115 10     Pain Loc --      Pain Education --      Exclude from Growth Chart --     Most recent vital signs: Vitals:   11/06/23 1116  BP: 124/78  Pulse: (!) 101  Resp: 19  Temp: 97.6 F (36.4 C)   General: Awake, no distress.  CV:  Good peripheral perfusion.  Resp:  Normal effort.  Abd:  No distention.  Other:  Right labia is swollen, erythematous and fluctuant on palpation.  Tender to palpation.   ED Results / Procedures / Treatments   Labs (all labs ordered are listed, but only abnormal results are displayed) Labs Reviewed - No data to display   PROCEDURES:  Critical Care performed: No  .Incision and Drainage  Date/Time: 11/06/2023 1:59 PM  Performed by: Cameron Ali, PA-C Authorized by: Cameron Ali, PA-C   Consent:    Consent obtained:  Verbal   Consent given by:  Patient   Risks, benefits, and  alternatives were discussed: yes     Risks discussed:  Bleeding, incomplete drainage, damage to other organs, infection and pain   Alternatives discussed:  No treatment Universal protocol:    Patient identity confirmed:  Verbally with patient Location:    Type:  Bartholin cyst   Size:  2 cm   Location:  Anogenital   Anogenital location:  Bartholin's gland Pre-procedure details:    Skin preparation:  Povidone-iodine Sedation:    Sedation type:  None Anesthesia:    Anesthesia method:  Local infiltration   Local anesthetic:  Lidocaine 1% w/o epi Procedure type:    Complexity:  Simple Procedure details:    Incision types:  Stab incision   Incision depth:  Dermal   Wound management:  Probed and deloculated and irrigated with saline   Drainage:  Bloody and purulent   Drainage amount:  Copious   Wound treatment:  Wound left open Post-procedure details:    Procedure completion:  Tolerated well, no immediate complications    MEDICATIONS ORDERED IN  ED: Medications  lidocaine (PF) (XYLOCAINE) 1 % injection 5 mL (5 mLs Intradermal Given by Other 11/06/23 1226)     IMPRESSION / MDM / ASSESSMENT AND PLAN / ED COURSE  I reviewed the triage vital signs and the nursing notes.                             78 year old female presents for evaluation of Bartholin abscess.  Patient was slightly tachycardic on presentation but vital signs stable otherwise.  Patient NAD on exam.  Differential diagnosis includes, but is not limited to, Bartholin abscess, cellulitis, folliculitis, cystocele, malignancy.  Patient's presentation is most consistent with acute, uncomplicated illness.  Patient has a right Bartholin abscess.  Incision and drainage performed.  Please see procedure note above.  Patient will be started on oral antibiotics and was instructed to follow-up with her OB/GYN next week.  Patient can take Tylenol and ibuprofen as needed for pain.  She voiced understanding, all questions were  answered and she was stable at discharge.      FINAL CLINICAL IMPRESSION(S) / ED DIAGNOSES   Final diagnoses:  Abscess of right Bartholin gland     Rx / DC Orders   ED Discharge Orders          Ordered    cefixime (SUPRAX) 400 MG CAPS capsule  Daily        11/06/23 1355    clindamycin (CLEOCIN) 300 MG capsule  4 times daily        11/06/23 1355             Note:  This document was prepared using Dragon voice recognition software and may include unintentional dictation errors.   Cameron Ali, PA-C 11/06/23 1402    Jene Every, MD 11/07/23 1355

## 2023-11-06 NOTE — Discharge Instructions (Addendum)
 Please take the antibiotics as prescribed. You can take 650 mg of Tylenol and 600 mg of ibuprofen every 6 hours as needed for pain.  Follow up with Dr. Dalbert Garnet next week. You will likely have some bleeding for the next few days, this is normal. You may also see some pus, this is also normal. I also recommend soaking in warm water for 15-20 minutes a day, this will help to keep the area soft and will encourage further drainage.

## 2023-11-19 ENCOUNTER — Other Ambulatory Visit: Payer: Self-pay

## 2023-11-19 ENCOUNTER — Emergency Department
Admission: EM | Admit: 2023-11-19 | Discharge: 2023-11-19 | Disposition: A | Discharge status: Home / Self Care | Attending: Emergency Medicine | Admitting: Emergency Medicine

## 2023-11-19 DIAGNOSIS — N751 Abscess of Bartholin's gland: Secondary | ICD-10-CM | POA: Diagnosis present

## 2023-11-19 MED ORDER — SULFAMETHOXAZOLE-TRIMETHOPRIM 800-160 MG PO TABS
1.0000 | ORAL_TABLET | Freq: Once | ORAL | Status: AC
  Administered 2023-11-19: 1 via ORAL
  Filled 2023-11-19: qty 1

## 2023-11-19 MED ORDER — SULFAMETHOXAZOLE-TRIMETHOPRIM 800-160 MG PO TABS: 1.0000 | ORAL_TABLET | Freq: Two times a day (BID) | ORAL | 0 refills | Status: AC

## 2023-11-19 MED ORDER — LIDOCAINE HCL (PF) 1 % IJ SOLN
5.0000 mL | Freq: Once | INTRAMUSCULAR | Status: AC
  Administered 2023-11-19: 5 mL
  Filled 2023-11-19: qty 5

## 2023-11-19 MED ORDER — BENZOCAINE 20 % MT SOLN
Freq: Once | OROMUCOSAL | Status: AC
  Filled 2023-11-19: qty 1

## 2023-11-19 MED ORDER — BENZOCAINE 20 % MT SOLN
Freq: Once | OROMUCOSAL | Status: DC
  Filled 2023-11-19 (×3): qty 1

## 2023-11-19 NOTE — ED Triage Notes (Signed)
 Pt comes with c/o cyst in vaginal area. Pt state two weeks it started. Pt states she was seen here 2 weeks ago here in ED. Pt states she has appt with gyn but pain too intense to make it.

## 2023-11-19 NOTE — Discharge Instructions (Addendum)
 You have had your Bartholin gland cyst abscess incised and drained.  A Ward catheter has been placed.  Keep the area clean, dry, and covered as necessary.  Follow-up with Dr. Dalbert Garnet for wound check as scheduled.  Take antibiotic as directed.

## 2023-11-19 NOTE — ED Provider Notes (Signed)
 [facility] Emergency Department Provider Note     Event Date/Time   First MD Initiated Contact with Patient 11/19/23 1342     (approximate)   History   Cyst   HPI  Tamara Villegas is a 78 y.o. female with a history of hypertension, DM type II, CKD, and CAD who presents to the ED for reevaluation of a abscess to the vulvar region.  Patient was a seen 2 weeks ago, at the onset of an area to the vulvar that she described as a Bartholin gland cyst abscess.  She has a history of recurrent Bartholin cyst abscess, and had return to her gynecologist 2 weeks ago at onset.  She presents to the ED on 2/24, and had a local I&D procedure performed.  By chart review, that procedure was successful, noting significant purulent drainage from the abscess.  No packing or Ward catheter was placed at that time.  She presents today, stating that the pain has intensified.  She does have an appointment scheduled with her GYN provider, but was concerned she could not make an appointment due to the increasing pain.  She denies any fevers, chills, or sweats.  Physical Exam   Triage Vital Signs: ED Triage Vitals [11/19/23 1317]  Encounter Vitals Group     BP (!) 151/79     Systolic BP Percentile      Diastolic BP Percentile      Pulse Rate 81     Resp 17     Temp 98 F (36.7 C)     Temp src      SpO2 100 %     Weight 162 lb (73.5 kg)     Height 5\' 5"  (1.651 m)     Head Circumference      Peak Flow      Pain Score 8     Pain Loc      Pain Education      Exclude from Growth Chart     Most recent vital signs: Vitals:   11/19/23 1317  BP: (!) 151/79  Pulse: 81  Resp: 17  Temp: 98 F (36.7 C)  SpO2: 100%    General Awake, no distress. NAD HEENT NCAT. PERRL. EOMI. No rhinorrhea. Mucous membranes are moist.  CV:  Good peripheral perfusion.  RESP:  Normal effort.  ABD:  No distention.  GU:  Normal external genitalia except for a large cystic lesion to the right labia  concerning for Bartholin gland cyst abscess.   ED Results / Procedures / Treatments   Labs (all labs ordered are listed, but only abnormal results are displayed) Labs Reviewed - No data to display   EKG    RADIOLOGY   No results found.   PROCEDURES:  Critical Care performed: No  .Incision and Drainage  Date/Time: 11/20/2023 1:56 PM  Performed by: Lissa Hoard, PA-C Authorized by: Lissa Hoard, PA-C   Consent:    Consent obtained:  Verbal   Consent given by:  Patient   Risks, benefits, and alternatives were discussed: yes     Risks discussed:  Bleeding, incomplete drainage, pain and infection Universal protocol:    Site/side marked: yes     Patient identity confirmed:  Verbally with patient Location:    Type:  Bartholin cyst   Size:  5   Location:  Anogenital   Anogenital location:  Vulva Pre-procedure details:    Skin preparation:  Povidone-iodine Sedation:    Sedation  type:  None Anesthesia:    Anesthesia method:  Topical application and local infiltration   Topical anesthesia: benzocaine spray.   Local anesthetic:  Lidocaine 1% w/o epi Procedure type:    Complexity:  Simple Procedure details:    Ultrasound guidance: no     Needle aspiration: no     Incision types:  Single straight   Incision depth:  Subcutaneous   Wound management:  Probed and deloculated and irrigated with saline   Drainage:  Purulent   Drainage amount:  Copious   Wound treatment:  Drain placed   Packing materials:  Word catheter Post-procedure details:    Procedure completion:  Tolerated well, no immediate complications    MEDICATIONS ORDERED IN ED: Medications  lidocaine (PF) (XYLOCAINE) 1 % injection 5 mL (5 mLs Infiltration Given 11/19/23 1436)  sulfamethoxazole-trimethoprim (BACTRIM DS) 800-160 MG per tablet 1 tablet (1 tablet Oral Given 11/19/23 1435)  benzocaine (HURRICAINE) 20 % mouth spray ( Mouth/Throat Given by Other 11/19/23 1526)     IMPRESSION  / MDM / ASSESSMENT AND PLAN / ED COURSE  I reviewed the triage vital signs and the nursing notes.                              Differential diagnosis includes, but is not limited to, vulvar abscess, Bartholin's gland cyst abscess, cellulitis, ingrown hair, Fournier's gangrene  Patient's presentation is most consistent with acute complicated illness / injury requiring diagnostic workup.  Patient's diagnosis is consistent with Bartholin's gland abscess.  Patient returns to the ED for recurrence of an acute right Bartholin gland cyst abscess.  Patient consents to I&D procedure which was performed with meaningful expression of copious amount of purulent drainage.  Word catheter was placed without difficulty.  Patient will be discharged home with prescriptions for Bactrim. Patient is to follow up with her GYN provider as scheduled, as needed or otherwise directed. Patient is given ED precautions to return to the ED for any worsening or new symptoms.   FINAL CLINICAL IMPRESSION(S) / ED DIAGNOSES   Final diagnoses:  Abscess of right Bartholin gland     Rx / DC Orders   ED Discharge Orders          Ordered    sulfamethoxazole-trimethoprim (BACTRIM DS) 800-160 MG tablet  2 times daily        11/19/23 1551             Note:  This document was prepared using Dragon voice recognition software and may include unintentional dictation errors.    Lissa Hoard, PA-C 11/20/23 1929    Trinna Post, MD 11/20/23 [phone]

## 2023-11-19 NOTE — ED Notes (Signed)
 See triage note  Presents with possible bartholin cyst  Hx of same  States she has had one lanced couple weeks ago  but this returned 2 days ago

## 2024-05-14 ENCOUNTER — Encounter: Payer: Self-pay | Admitting: Internal Medicine

## 2024-05-14 ENCOUNTER — Other Ambulatory Visit: Payer: Self-pay | Admitting: Internal Medicine

## 2024-05-14 DIAGNOSIS — Z1231 Encounter for screening mammogram for malignant neoplasm of breast: Secondary | ICD-10-CM

## 2024-06-13 ENCOUNTER — Inpatient Hospital Stay: Admission: RE | Admit: 2024-06-13 | Source: Ambulatory Visit
